# Patient Record
Sex: Female | Born: 1995 | Race: Black or African American | Hispanic: No | Smoking: Former smoker
Health system: Southern US, Community
[De-identification: ages and names within clinical notes are randomized; demographics above are authoritative.]

## PROBLEM LIST (undated history)

## (undated) DIAGNOSIS — K219 Gastro-esophageal reflux disease without esophagitis: Secondary | ICD-10-CM

## (undated) DIAGNOSIS — L309 Dermatitis, unspecified: Secondary | ICD-10-CM

## (undated) DIAGNOSIS — F419 Anxiety disorder, unspecified: Secondary | ICD-10-CM

## (undated) HISTORY — DX: Anxiety disorder, unspecified: F41.9

## (undated) HISTORY — PX: NO PAST SURGERIES: SHX2092

---
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
## 2013-09-02 ENCOUNTER — Emergency Department (HOSPITAL_COMMUNITY)
Admission: EM | Admit: 2013-09-02 | Discharge: 2013-09-02 | Disposition: A | Discharge status: Home / Self Care | Payer: PRIVATE HEALTH INSURANCE | Attending: Emergency Medicine | Admitting: Emergency Medicine

## 2013-09-02 ENCOUNTER — Encounter (HOSPITAL_COMMUNITY): Payer: Self-pay | Admitting: Emergency Medicine

## 2013-09-02 DIAGNOSIS — L259 Unspecified contact dermatitis, unspecified cause: Secondary | ICD-10-CM | POA: Insufficient documentation

## 2013-09-02 DIAGNOSIS — Z791 Long term (current) use of non-steroidal anti-inflammatories (NSAID): Secondary | ICD-10-CM | POA: Insufficient documentation

## 2013-09-02 DIAGNOSIS — L309 Dermatitis, unspecified: Secondary | ICD-10-CM

## 2013-09-02 HISTORY — DX: Dermatitis, unspecified: L30.9

## 2013-09-02 MED ORDER — CETIRIZINE HCL 10 MG PO TABS: 10.0000 mg | ORAL_TABLET | Freq: Every day | ORAL | Status: DC

## 2013-09-02 MED ORDER — TRIAMCINOLONE ACETONIDE 0.5 % EX OINT: 1.0000 "application " | TOPICAL_OINTMENT | Freq: Two times a day (BID) | CUTANEOUS | Status: DC

## 2013-09-02 MED ORDER — PREDNISONE 10 MG PO TABS: 20.0000 mg | ORAL_TABLET | Freq: Two times a day (BID) | ORAL | Status: DC

## 2013-09-02 NOTE — ED Notes (Signed)
Itching rash for 1 week

## 2013-09-02 NOTE — Discharge Instructions (Signed)
Use Eucerin Cream and take Benadryl at night if you continue to have itching. Return as needed.

## 2013-09-02 NOTE — ED Notes (Signed)
Patient with no complaints at this time. Respirations even and unlabored. Skin warm/dry. Discharge instructions reviewed with patient at this time. Patient given opportunity to voice concerns/ask questions. Patient discharged at this time and left Emergency Department with steady gait.   

## 2013-09-02 NOTE — ED Provider Notes (Signed)
CSN: [id number]     Arrival date & time 09/02/13  1940 History   First MD Initiated Contact with Patient 09/02/13 2000     Chief Complaint  Patient presents with  . Rash     (Consider location/radiation/quality/duration/timing/severity/associated sxs/prior Treatment) Patient is a 18 y.o. female presenting with rash. The history is provided by the patient.  Rash Location:  Shoulder/arm and torso Shoulder/arm rash location:  L arm and R arm Torso rash location:  Abd LLQ and abd RLQ Quality: dryness and itchiness   Severity:  Moderate Onset quality:  Gradual Duration:  1 week Timing:  Constant Progression:  Worsening Chronicity:  Recurrent Relieved by:  Nothing Worsened by:  Nothing tried Ineffective treatments:  Antihistamines and anti-itch cream  Anna Andrews is a 18 y.o. female who presents to the ED with a rash that started a week ago. She has a history of Eczema and this is the same. She complains of itching.  Past Medical History  Diagnosis Date  . Eczema    History reviewed. No pertinent past surgical history. History reviewed. No pertinent family history. History  Substance Use Topics  . Smoking status: Never Smoker   . Smokeless tobacco: Not on file  . Alcohol Use: No   OB History   Grav Para Term Preterm Abortions TAB SAB Ect Mult Living                 Review of Systems  Skin: Positive for rash.  all other systems negative    Allergies  Review of patient's allergies indicates no known allergies.  Home Medications   Prior to Admission medications   Medication Sig Start Date End Date Taking? Authorizing Provider  ibuprofen (ADVIL,MOTRIN) 200 MG tablet Take 400 mg by mouth 2 (two) times daily as needed for pain.    Historical Provider, MD  naproxen sodium (ALEVE) 220 MG tablet Take 220 mg by mouth daily as needed (cramps).    Historical Provider, MD   BP 129/81  Pulse 71  Temp(Src) 98 F (36.7 C) (Oral)  Resp 20  Ht 5' 7.5" (1.715 m)  Wt 156 lb  (70.761 kg)  BMI 24.06 kg/m2  SpO2 99%  LMP 08/19/2013 Physical Exam  Nursing note and vitals reviewed. Constitutional: She is oriented to person, place, and time. She appears well-developed and well-nourished.  HENT:  Head: Normocephalic and atraumatic.  Eyes: EOM are normal.  Neck: Neck supple.  Cardiovascular: Normal rate.   Pulmonary/Chest: Effort normal.  Musculoskeletal: Normal range of motion.  Neurological: She is alert and oriented to person, place, and time. No cranial nerve deficit.  Skin:  Dry, scaly, patches noted elbows, forearms and lower abdomen.   Psychiatric: She has a normal mood and affect. Her behavior is normal.    ED Course  Procedures (including critical care time) Labs Review  MDM  18 y.o. female with rash and itching x 1 week. Rash consistent with eczema. Will treat with [place] Cream, prednisone and she will take Benadryl as needed. She will use Eucerin cream to try and keep the skin moist. She will follow up with Dermatology if symptoms worsen. She will return here as needed.    Medication List    TAKE these medications       cetirizine 10 MG tablet  Commonly known as:  ZYRTEC ALLERGY  Take 1 tablet (10 mg total) by mouth daily.     predniSONE 10 MG tablet  Commonly known as:  DELTASONE  Take 2  tablets (20 mg total) by mouth 2 (two) times daily with a meal.     triamcinolone ointment 0.5 %  Commonly known as:  KENALOG  Apply 1 application topically 2 (two) times daily.      ASK your doctor about these medications       diphenhydrAMINE 25 MG tablet  Commonly known as:  BENADRYL  Take 25 mg by mouth once as needed.           [address]Hope MedinaM Neese, [place] 09/03/13 [phone]

## 2013-09-03 NOTE — ED Provider Notes (Signed)
Medical screening examination/treatment/procedure(s) were performed by non-physician practitioner and as supervising physician I was immediately available for consultation/collaboration.   EKG Interpretation None        Lyanne CoKevin M Campos, MD 09/03/13 0030

## 2014-10-09 ENCOUNTER — Emergency Department (HOSPITAL_COMMUNITY)
Admission: EM | Admit: 2014-10-09 | Discharge: 2014-10-09 | Disposition: A | Discharge status: Home / Self Care | Payer: PRIVATE HEALTH INSURANCE | Attending: Emergency Medicine | Admitting: Emergency Medicine

## 2014-10-09 ENCOUNTER — Encounter (HOSPITAL_COMMUNITY): Payer: Self-pay

## 2014-10-09 DIAGNOSIS — Z79899 Other long term (current) drug therapy: Secondary | ICD-10-CM | POA: Diagnosis not present

## 2014-10-09 DIAGNOSIS — Z7952 Long term (current) use of systemic steroids: Secondary | ICD-10-CM | POA: Diagnosis not present

## 2014-10-09 DIAGNOSIS — Y9389 Activity, other specified: Secondary | ICD-10-CM | POA: Diagnosis not present

## 2014-10-09 DIAGNOSIS — S46911A Strain of unspecified muscle, fascia and tendon at shoulder and upper arm level, right arm, initial encounter: Secondary | ICD-10-CM | POA: Diagnosis not present

## 2014-10-09 DIAGNOSIS — X58XXXA Exposure to other specified factors, initial encounter: Secondary | ICD-10-CM | POA: Insufficient documentation

## 2014-10-09 DIAGNOSIS — S199XXA Unspecified injury of neck, initial encounter: Secondary | ICD-10-CM | POA: Insufficient documentation

## 2014-10-09 DIAGNOSIS — Y9289 Other specified places as the place of occurrence of the external cause: Secondary | ICD-10-CM | POA: Insufficient documentation

## 2014-10-09 DIAGNOSIS — Z872 Personal history of diseases of the skin and subcutaneous tissue: Secondary | ICD-10-CM | POA: Diagnosis not present

## 2014-10-09 DIAGNOSIS — Y998 Other external cause status: Secondary | ICD-10-CM | POA: Insufficient documentation

## 2014-10-09 DIAGNOSIS — S4991XA Unspecified injury of right shoulder and upper arm, initial encounter: Secondary | ICD-10-CM | POA: Diagnosis present

## 2014-10-09 MED ORDER — KETOROLAC TROMETHAMINE 10 MG PO TABS
10.0000 mg | ORAL_TABLET | Freq: Once | ORAL | Status: AC
  Administered 2014-10-09: 10 mg via ORAL
  Filled 2014-10-09: qty 1

## 2014-10-09 MED ORDER — METHOCARBAMOL 500 MG PO TABS: 500.0000 mg | ORAL_TABLET | Freq: Three times a day (TID) | ORAL | Status: DC

## 2014-10-09 MED ORDER — METHOCARBAMOL 500 MG PO TABS
1000.0000 mg | ORAL_TABLET | Freq: Once | ORAL | Status: AC
  Administered 2014-10-09: 1000 mg via ORAL
  Filled 2014-10-09: qty 2

## 2014-10-09 MED ORDER — IBUPROFEN 600 MG PO TABS: 600.0000 mg | ORAL_TABLET | Freq: Four times a day (QID) | ORAL | Status: DC

## 2014-10-09 NOTE — ED Provider Notes (Signed)
CSN: [id number]     Arrival date & time 10/09/14  0759 History   First MD Initiated Contact with Patient 10/09/14 [phone]     Chief Complaint  Patient presents with  . Shoulder Pain     (Consider location/radiation/quality/duration/timing/severity/associated sxs/prior Treatment) HPI Comments: Patient is a 19 year old female who presents to the emergency department with right shoulder pain. The patient is right-handed, and she is a Tree surgeon. She states that over the last 3 or 4 days she has been doing more repetitive motion, in particular coping and blow drying than usual with right hand. She denies any recent injury. She denies any operations or procedures involving the right shoulder. She is not dropping objects, but hasn't pain with certain movements. She presents now for evaluation as she says that this Most of the night.  Patient is a 19 y.o. female presenting with shoulder pain. The history is provided by the patient.  Shoulder Pain Location:  Shoulder Associated symptoms: no back pain and no neck pain     Past Medical History  Diagnosis Date  . Eczema    No past surgical history on file. No family history on file. History  Substance Use Topics  . Smoking status: Never Smoker   . Smokeless tobacco: Not on file  . Alcohol Use: No   OB History    No data available     Review of Systems  Constitutional: Negative for activity change.       All ROS Neg except as noted in HPI  HENT: Negative for nosebleeds.   Eyes: Negative for photophobia and discharge.  Respiratory: Negative for cough, shortness of breath and wheezing.   Cardiovascular: Negative for chest pain and palpitations.  Gastrointestinal: Negative for abdominal pain and blood in stool.  Genitourinary: Negative for dysuria, frequency and hematuria.  Musculoskeletal: Positive for arthralgias and neck stiffness. Negative for back pain and neck pain.  Skin: Negative.   Neurological: Negative for dizziness, seizures and  speech difficulty.  Psychiatric/Behavioral: Negative for hallucinations and confusion.      Allergies  Review of patient's allergies indicates no known allergies.  Home Medications   Prior to Admission medications   Medication Sig Start Date End Date Taking? Authorizing Provider  cetirizine (ZYRTEC ALLERGY) 10 MG tablet Take 1 tablet (10 mg total) by mouth daily. 09/02/13   Hope Orlene Och, NP  diphenhydrAMINE (BENADRYL) 25 MG tablet Take 25 mg by mouth once as needed.    Historical Provider, MD  predniSONE (DELTASONE) 10 MG tablet Take 2 tablets (20 mg total) by mouth 2 (two) times daily with a meal. 09/02/13   Hope Orlene Och, NP  triamcinolone ointment (KENALOG) 0.5 % Apply 1 application topically 2 (two) times daily. 09/02/13   Hope Orlene Och, NP   There were no vitals taken for this visit. Physical Exam  Constitutional: She is oriented to person, place, and time. She appears well-developed and well-nourished.  Non-toxic appearance.  HENT:  Head: Normocephalic.  Right Ear: Tympanic membrane and external ear normal.  Left Ear: Tympanic membrane and external ear normal.  Eyes: EOM and lids are normal. Pupils are equal, round, and reactive to light.  Neck: Trachea normal. Neck supple. Muscular tenderness present. Carotid bruit is not present. Decreased range of motion present. No thyroid mass and no thyromegaly present.  Cardiovascular: Normal rate, regular rhythm, normal heart sounds, intact distal pulses and normal pulses.   Pulmonary/Chest: Breath sounds normal. No respiratory distress.  Abdominal: Soft. Bowel sounds are normal. There  is no tenderness. There is no guarding.  Musculoskeletal:       Right shoulder: She exhibits decreased range of motion, tenderness, pain and spasm. She exhibits no bony tenderness, no crepitus and no deformity.  Lymphadenopathy:       Head (right side): No submandibular adenopathy present.       Head (left side): No submandibular adenopathy present.    She  has no cervical adenopathy.  Neurological: She is alert and oriented to person, place, and time. She has normal strength. No cranial nerve deficit or sensory deficit.  Skin: Skin is warm and dry.  Psychiatric: She has a normal mood and affect. Her speech is normal.  Nursing note and vitals reviewed.   ED Course  Procedures (including critical care time) Labs Review Labs Reviewed - No data to display  Imaging Review No results found.   EKG Interpretation None      MDM  Vital signs are well within normal limits. The examination table muscle strain involving the shoulder extending into the neck.  The patient will be treated with an arm sling, Robaxin, and ibuprofen 600 mg 4 times a day. The patient is to see orthopedics for additional evaluation if not improving.    Final diagnoses:  None    *I have reviewed nursing notes, vital signs, and all appropriate lab and imaging results for this patient.[id number] PA-C 10/09/14 1610  Rolland Porter, MD 10/15/14 2139

## 2014-10-09 NOTE — Discharge Instructions (Signed)
Your examination is consistent with a muscle strain involving her shoulder and the muscle of your lower neck. Heating pad to the area may be helpful. Please use Robaxin 3 times daily (breakfast lunch bedtime). Use 600 mg of ibuprofen 4 times daily (breakfast, lunch, dinner, bedtime). Please see Dr. Hilda Lias for orthopedic evaluation if not improving. Shoulder Sprain A shoulder sprain is the result of damage to the tough, fiber-like tissues (ligaments) that help hold your shoulder in place. The ligaments may be stretched or torn. Besides the main shoulder joint (the ball and socket), there are several smaller joints that connect the bones in this area. A sprain usually involves one of those joints. Most often it is the acromioclavicular (or AC) joint. That is the joint that connects the collarbone (clavicle) and the shoulder blade (scapula) at the top point of the shoulder blade (acromion). A shoulder sprain is a mild form of what is called a shoulder separation. Recovering from a shoulder sprain may take some time. For some, pain lingers for several months. Most people recover without long term problems. CAUSES   A shoulder sprain is usually caused by some kind of trauma. This might be:  Falling on an outstretched arm.  Being hit hard on the shoulder.  Twisting the arm.  Shoulder sprains are more likely to occur in people who:  Play sports.  Have balance or coordination problems. SYMPTOMS   Pain when you move your shoulder.  Limited ability to move the shoulder.  Swelling and tenderness on top of the shoulder.  Redness or warmth in the shoulder.  Bruising.  A change in the shape of the shoulder. DIAGNOSIS  Your healthcare provider may:  Ask about your symptoms.  Ask about recent activity that might have caused those symptoms.  Examine your shoulder. You may be asked to do simple exercises to test movement. The other shoulder will be examined for comparison.  Order some tests  that provide a look inside the body. They can show the extent of the injury. The tests could include:  X-rays.  CT (computed tomography) scan.  MRI (magnetic resonance imaging) scan. RISKS AND COMPLICATIONS  Loss of full shoulder motion.  Ongoing shoulder pain. TREATMENT  How long it takes to recover from a shoulder sprain depends on how severe it was. Treatment options may include:  Rest. You should not use the arm or shoulder until it heals.  Ice. For 2 or 3 days after the injury, put an ice pack on the shoulder up to 4 times a day. It should stay on for 15 to 20 minutes each time. Wrap the ice in a towel so it does not touch your skin.  Over-the-counter medicine to relieve pain.  A sling or brace. This will keep the arm still while the shoulder is healing.  Physical therapy or rehabilitation exercises. These will help you regain strength and motion. Ask your healthcare provider when it is OK to begin these exercises.  Surgery. The need for surgery is rare with a sprained shoulder, but some people may need surgery to keep the joint in place and reduce pain. HOME CARE INSTRUCTIONS   Ask your healthcare provider about what you should and should not do while your shoulder heals.  Make sure you know how to apply ice to the correct area of your shoulder.  Talk with your healthcare provider about which medications should be used for pain and swelling.  If rehabilitation therapy will be needed, ask your healthcare provider to refer  you to a therapist. If it is not recommended, then ask about at-home exercises. Find out when exercise should begin. SEEK MEDICAL CARE IF:  Your pain, swelling, or redness at the joint increases. SEEK IMMEDIATE MEDICAL CARE IF:   You have a fever.  You cannot move your arm or shoulder. Document Released: 07/13/2008 Document Revised: 05/19/2011 Document Reviewed: 07/13/2008 [facility] Patient Information 2015 [place], [place]. This information is not  intended to replace advice given to you by your health care provider. Make sure you discuss any questions you have with your health care provider.

## 2014-10-09 NOTE — ED Notes (Signed)
Right shoulder pain x4 days, no known injury. Using icy hot cream with no relief.

## 2014-10-09 NOTE — ED Notes (Signed)
PA at bedside.

## 2016-08-20 ENCOUNTER — Ambulatory Visit (INDEPENDENT_AMBULATORY_CARE_PROVIDER_SITE_OTHER): Payer: PRIVATE HEALTH INSURANCE | Admitting: Adult Health

## 2016-08-20 ENCOUNTER — Encounter: Payer: Self-pay | Admitting: Adult Health

## 2016-08-20 ENCOUNTER — Other Ambulatory Visit (HOSPITAL_COMMUNITY)
Admission: RE | Admit: 2016-08-20 | Discharge: 2016-08-20 | Disposition: A | Discharge status: Home / Self Care | Payer: PRIVATE HEALTH INSURANCE | Source: Ambulatory Visit | Attending: Adult Health | Admitting: Adult Health

## 2016-08-20 VITALS — BP 124/78 | HR 88 | Ht 69.5 in | Wt 203.0 lb

## 2016-08-20 DIAGNOSIS — Z01419 Encounter for gynecological examination (general) (routine) without abnormal findings: Secondary | ICD-10-CM | POA: Insufficient documentation

## 2016-08-20 DIAGNOSIS — Z13220 Encounter for screening for lipoid disorders: Secondary | ICD-10-CM | POA: Diagnosis not present

## 2016-08-20 DIAGNOSIS — Z113 Encounter for screening for infections with a predominantly sexual mode of transmission: Secondary | ICD-10-CM | POA: Diagnosis not present

## 2016-08-20 NOTE — Progress Notes (Signed)
Patient ID: Anna Andrews, female   DOB: June 06, 1995, 21 y.o.   MRN: [id number] History of Present Illness: Anna Andrews is a 21 year old black female in for well woman gyn exam and her first pap. She is hair stylist.    Current Medications, Allergies, Past Medical History, Past Surgical History, Family History and Social History were reviewed in [facility] electronic medical record.     Review of Systems:  Patient denies any hearing loss, fatigue, blurred vision, shortness of breath, chest pain, abdominal pain, problems with bowel movements, urination, or intercourse(not currently active). No joint pain or mood swings.Has headache recently.Periods are good.    Physical Exam:BP 124/78 (BP Location: Right Arm, Patient Position: Sitting, Cuff Size: Normal)   Pulse 88   Ht 5' 9.5" (1.765 m)   Wt 203 lb (92.1 kg)   LMP 07/27/2016 (Approximate)   BMI 29.55 kg/m  General:  Well developed, well nourished, no acute distress Skin:  Warm and dry Neck:  Midline trachea, normal thyroid, good ROM, no lymphadenopathy, no tenderness over sinuses  Lungs; Clear to auscultation bilaterally Breast:  No dominant palpable mass, retraction, or nipple discharge Cardiovascular: Regular rate and rhythm Abdomen:  Soft, non tender, no hepatosplenomegaly,has navel ring Pelvic:  External genitalia is normal in appearance, no lesions.  The vagina is normal in appearance. Urethra has no lesions or masses. The cervix is smooth, pap with GC/CHL and reflex HPV performed.  Uterus is felt to be normal size, shape, and contour.  No adnexal masses or tenderness noted.Bladder is non tender, no masses felt. Extremities/musculoskeletal:  No swelling or varicosities noted, no clubbing or cyanosis Psych:  No mood changes, alert and cooperative,seems happy PHQ 2 score 0.She declines birth control at this time.   Impression: 1. Encounter for gynecological examination with Papanicolaou smear of cervix   2. Screening examination for  STD (sexually transmitted disease)   3. Screening cholesterol level       Plan: Check CBC,CMP,TSH and lipids,HIV and RPR Physical in 1 year,pap in 3 if normal Use condoms if has sex Take zyrtec, or Claritin, see eye doctor

## 2016-08-21 ENCOUNTER — Telehealth: Payer: Self-pay | Admitting: Adult Health

## 2016-08-21 LAB — COMPREHENSIVE METABOLIC PANEL
A/G RATIO: 1.5 (ref 1.2–2.2)
ALK PHOS: 49 IU/L (ref 39–117)
ALT: 12 IU/L (ref 0–32)
AST: 12 IU/L (ref 0–40)
Albumin: 4.3 g/dL (ref 3.5–5.5)
BILIRUBIN TOTAL: 0.3 mg/dL (ref 0.0–1.2)
BUN/Creatinine Ratio: 15 (ref 9–23)
BUN: 11 mg/dL (ref 6–20)
CHLORIDE: 104 mmol/L (ref 96–106)
CO2: 24 mmol/L (ref 20–29)
Calcium: 9.3 mg/dL (ref 8.7–10.2)
Creatinine, Ser: 0.74 mg/dL (ref 0.57–1.00)
GFR calc non Af Amer: 115 mL/min/{1.73_m2} (ref >59)
GFR, EST AFRICAN AMERICAN: 132 mL/min/{1.73_m2} (ref >59)
GLUCOSE: 88 mg/dL (ref 65–99)
Globulin, Total: 2.8 g/dL (ref 1.5–4.5)
POTASSIUM: 4.6 mmol/L (ref 3.5–5.2)
Sodium: 140 mmol/L (ref 134–144)
TOTAL PROTEIN: 7.1 g/dL (ref 6.0–8.5)

## 2016-08-21 LAB — CBC
Hematocrit: 38 % (ref 34.0–46.6)
Hemoglobin: 12.3 g/dL (ref 11.1–15.9)
MCH: 27.2 pg (ref 26.6–33.0)
MCHC: 32.4 g/dL (ref 31.5–35.7)
MCV: 84 fL (ref 79–97)
PLATELETS: 332 10*3/uL (ref 150–379)
RBC: 4.53 x10E6/uL (ref 3.77–5.28)
RDW: 13.8 % (ref 12.3–15.4)
WBC: 8.4 10*3/uL (ref 3.4–10.8)

## 2016-08-21 LAB — LIPID PANEL
CHOL/HDL RATIO: 3.3 ratio (ref 0.0–4.4)
CHOLESTEROL TOTAL: 161 mg/dL (ref 100–199)
HDL: 49 mg/dL (ref >39)
LDL Calculated: 98 mg/dL (ref 0–99)
TRIGLYCERIDES: 68 mg/dL (ref 0–149)
VLDL Cholesterol Cal: 14 mg/dL (ref 5–40)

## 2016-08-21 LAB — CYTOLOGY - PAP
CHLAMYDIA, DNA PROBE: NEGATIVE
Diagnosis: NEGATIVE
NEISSERIA GONORRHEA: NEGATIVE

## 2016-08-21 LAB — TSH: TSH: 1.59 u[IU]/mL (ref 0.450–4.500)

## 2016-08-21 LAB — HIV ANTIBODY (ROUTINE TESTING W REFLEX): HIV Screen 4th Generation wRfx: NONREACTIVE

## 2016-08-21 LAB — RPR: RPR Ser Ql: NONREACTIVE

## 2016-08-21 NOTE — Telephone Encounter (Signed)
Pt aware that labs are all good, and negative for HIV and syphilis

## 2016-12-15 ENCOUNTER — Telehealth: Payer: Self-pay | Admitting: Adult Health

## 2016-12-15 MED ORDER — NORETHIN-ETH ESTRAD-FE BIPHAS 1 MG-10 MCG / 10 MCG PO TABS: 1.0000 | ORAL_TABLET | Freq: Every day | ORAL | 4 refills | Status: DC

## 2016-12-15 NOTE — Telephone Encounter (Signed)
Pt requests birth control will try low dose OCs, rx lo leostrin, start today, use condoms

## 2016-12-15 NOTE — Telephone Encounter (Signed)
Pt called stating that she seen Victorino Dike not to long ago to discuss BC, Pt states that she is wanting one that they have discussed and would like Victorino Dike to call her back. Please contact pt

## 2017-06-10 ENCOUNTER — Telehealth: Payer: Self-pay | Admitting: *Deleted

## 2017-06-10 NOTE — Telephone Encounter (Signed)
Mailbox full

## 2017-06-11 ENCOUNTER — Other Ambulatory Visit: Payer: Self-pay

## 2017-06-11 ENCOUNTER — Encounter (HOSPITAL_COMMUNITY): Payer: Self-pay | Admitting: *Deleted

## 2017-06-11 ENCOUNTER — Emergency Department (HOSPITAL_COMMUNITY)
Admission: EM | Admit: 2017-06-11 | Discharge: 2017-06-11 | Disposition: A | Discharge status: Home / Self Care | Payer: PRIVATE HEALTH INSURANCE | Attending: Emergency Medicine | Admitting: Emergency Medicine

## 2017-06-11 DIAGNOSIS — M7918 Myalgia, other site: Secondary | ICD-10-CM

## 2017-06-11 DIAGNOSIS — M5489 Other dorsalgia: Secondary | ICD-10-CM | POA: Insufficient documentation

## 2017-06-11 DIAGNOSIS — F17210 Nicotine dependence, cigarettes, uncomplicated: Secondary | ICD-10-CM | POA: Diagnosis not present

## 2017-06-11 DIAGNOSIS — M791 Myalgia, unspecified site: Secondary | ICD-10-CM | POA: Insufficient documentation

## 2017-06-11 DIAGNOSIS — M549 Dorsalgia, unspecified: Secondary | ICD-10-CM

## 2017-06-11 DIAGNOSIS — Z79899 Other long term (current) drug therapy: Secondary | ICD-10-CM | POA: Diagnosis not present

## 2017-06-11 HISTORY — DX: Gastro-esophageal reflux disease without esophagitis: K21.9

## 2017-06-11 MED ORDER — PREDNISONE 10 MG PO TABS: 40.0000 mg | ORAL_TABLET | Freq: Every day | ORAL | 0 refills | Status: AC

## 2017-06-11 MED ORDER — ACETAMINOPHEN 500 MG PO TABS
1000.0000 mg | ORAL_TABLET | Freq: Once | ORAL | Status: AC
  Administered 2017-06-11: 1000 mg via ORAL
  Filled 2017-06-11: qty 2

## 2017-06-11 MED ORDER — CYCLOBENZAPRINE HCL 5 MG PO TABS: 5.0000 mg | ORAL_TABLET | Freq: Every evening | ORAL | 0 refills | Status: DC | PRN

## 2017-06-11 NOTE — ED Provider Notes (Signed)
[facility] EMERGENCY DEPARTMENT Provider Note   CSN: [id number] Arrival date & time: 06/11/17  8295     History   Chief Complaint Chief Complaint  Patient presents with  . Back Pain    HPI Anna Andrews is a 21 y.o. female presenting for evaluation of back pain.  She states for the past week, she has had back pain.  Is on the right side, radiates down her right arm and right leg.  This began after a deep tissue massage on Tuesday.  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  She has been taking ibuprofen with mild improvement of her pain, although recently was diagnosed with GERD and can no longer take ibuprofen.  She denies fall, trauma, or injury.  She denies fevers, chills, persistent numbness, loss of bowel or bladder control, history of cancer, history of IV drug use.  No other medical problems, does not take medications daily.  Pain is described as a soreness or an ache that begins in her back and radiates up and down.  Resting makes it better, movement makes it worse.  She reports intermittent abnormal sensation of her right hand with specific movements, mostly arching her back.  This improves when she changes positions.  She is not dropping things with her hand.  HPI  Past Medical History:  Diagnosis Date  . Acid reflux   . Eczema     Patient Active Problem List   Diagnosis Date Noted  . Encounter for gynecological examination with Papanicolaou smear of cervix 08/20/2016    History reviewed. No pertinent surgical history.   OB History    Gravida  0   Para  0   Term  0   Preterm  0   AB  0   Living  0     SAB  0   TAB  0   Ectopic  0   Multiple  0   Live Births               Home Medications    Prior to Admission medications   Medication Sig Start Date End Date Taking? Authorizing Provider  cetirizine (ZYRTEC ALLERGY) 10 MG tablet Take 1 tablet (10 mg total) by mouth daily. Patient not taking:  Reported on 08/20/2016 09/02/13   Janne Napoleon, NP  cyclobenzaprine (FLEXERIL) 5 MG tablet Take 1 tablet (5 mg total) by mouth at bedtime as needed for muscle spasms. 06/11/17   Delinda Malan, PA-C  diphenhydrAMINE (BENADRYL) 25 MG tablet Take 25 mg by mouth once as needed.    [provider]  ibuprofen (ADVIL,MOTRIN) 600 MG tablet Take 1 tablet (600 mg total) by mouth 4 (four) times daily. Take with each meal and bed time. 10/09/14   Ivery Quale, PA-C  Norethindrone-Ethinyl Estradiol-Fe Biphas (LO LOESTRIN FE) 1 MG-10 MCG / 10 MCG tablet Take 1 tablet by mouth daily. Take 1 daily by mouth 12/15/16   Adline Potter, NP  predniSONE (DELTASONE) 10 MG tablet Take 4 tablets (40 mg total) by mouth daily for 4 days. 06/11/17 06/15/17  Joeli Fenner, PA-C  triamcinolone ointment (KENALOG) 0.5 % Apply 1 application topically 2 (two) times daily. Patient not taking: Reported on 08/20/2016 09/02/13   Janne Napoleon, NP    Family History Family History  Problem Relation Age of Onset  . Hypertension Paternal Grandfather   . Hypertension Mother     Social History Social  History   Tobacco Use  . Smoking status: Light Tobacco Smoker    Types: Cigarettes  . Smokeless tobacco: Never Used  . Tobacco comment: occ  Substance Use Topics  . Alcohol use: No  . Drug use: No     Allergies   Patient has no known allergies.   Review of Systems Review of Systems  Constitutional: Negative for chills and fever.  Respiratory: Negative for cough.   Cardiovascular: Negative for chest pain.  Gastrointestinal: Negative for abdominal pain, nausea and vomiting.  Genitourinary: Negative for dysuria, frequency and hematuria.  Musculoskeletal: Positive for back pain and myalgias. Negative for neck pain.  Skin: Negative for wound.  Allergic/Immunologic: Negative for immunocompromised state.  Neurological: Negative for numbness.  Hematological: Does not bruise/bleed easily.     Physical  Exam Updated Vital Signs BP (!) 122/91   Pulse 97   Temp 98.5 F (36.9 C) (Oral)   Resp 16   Ht 5\' 8"  (1.727 m)   Wt 88.5 kg (195 lb)   LMP  (Within Weeks)   SpO2 100%   BMI 29.65 kg/m   Physical Exam  Constitutional: She is oriented to person, place, and time. She appears well-developed and well-nourished. No distress.  HENT:  Head: Normocephalic and atraumatic.  Eyes: Pupils are equal, round, and reactive to light. Conjunctivae and EOM are normal.  Neck: Normal range of motion. Neck supple.  No tenderness to palpation of C-spine  Cardiovascular: Normal rate, regular rhythm and intact distal pulses.  Pulmonary/Chest: Effort normal and breath sounds normal. No respiratory distress. She has no wheezes.  Abdominal: Soft. Bowel sounds are normal. She exhibits no distension. There is no tenderness.  Musculoskeletal: Normal range of motion.  Tenderness to palpation of right-sided back musculature, mostly along the erector spinae.  No tenderness to palpation over midline back or left side musculature.  Pain extends into the right trapezius.  Grip strength equal bilaterally.  Radial pulses intact bilaterally.  Sensation intact bilaterally.  No increased pain with straight leg raise or radiation of the pain.  Pedal pulses equal bilaterally.  Sensation intact bilaterally.  Patient is ambulatory.  Neurological: She is alert and oriented to person, place, and time. She displays normal reflexes. No sensory deficit.  Skin: Skin is warm and dry.  Psychiatric: She has a normal mood and affect.  Nursing note and vitals reviewed.    ED Treatments / Results  Labs (all labs ordered are listed, but only abnormal results are displayed) Labs Reviewed - No data to display  EKG None  Radiology No results found.  Procedures Procedures (including critical care time)  Medications Ordered in ED Medications  acetaminophen (TYLENOL) tablet 1,000 mg (1,000 mg Oral Given 06/11/17 1110)     Initial  Impression / Assessment and Plan / ED Course  I have reviewed the triage vital signs and the nursing notes.  Pertinent labs & imaging results that were available during my care of the patient were reviewed by me and considered in my medical decision making (see chart for details).     Patient presenting for evaluation of back pain.  Physical exam reassuring, she is neurovascularly intact.  No red flags for back pain.  Pain is reproducible with palpation of the musculature.  At this time, doubt fracture, infection, spinal cord compression, myelopathy, or cauda equina syndrome.  Patient concerned about a blood clot, doubt blood clot due to description of symptoms and physical exam.  Will treat symptomatically with muscle relaxers, Tylenol, and  steroids.  Follow-up with orthopedics and/or primary care as needed.  At this time, patient appears safe for discharge.  Return precautions given.  Patient states she understands and agrees to plan.   Final Clinical Impressions(s) / ED Diagnoses   Final diagnoses:  Other acute back pain  Musculoskeletal pain    ED Discharge Orders        Ordered    predniSONE (DELTASONE) 10 MG tablet  Daily     06/11/17 1054    cyclobenzaprine (FLEXERIL) 5 MG tablet  At bedtime PRN     06/11/17 1054       Clayson Riling, PA-C 06/11/17 1110    Vanetta MuldersZackowski, Scott, MD 06/12/17 [phone]

## 2017-06-11 NOTE — ED Triage Notes (Signed)
Pt c/o right sided back pain with tingling and numbness into the right arm x couple of days. Denies injury.

## 2017-06-11 NOTE — Discharge Instructions (Addendum)
Take prednisone as prescribed.  Use the muscle relaxer as needed for soreness.  Have caution as this may make you tired or groggy.  Do not drive or operate heavy machinery while taking this medicine. You may supplement with Tylenol as needed for pain control.  You may take up to 1000 mg 3 times a day. Follow-up with either your primary care doctor or the orthopedic doctor in 1 week for further evaluation if your symptoms are not improving. Try to rest over the next several days.  Do not remain in one position for an extended period of time. Use heating packs to help relax her muscles. Return to the emergency room if you develop fevers, loss of bowel or bladder control, persistent numbness, or any new or concerning symptoms.

## 2017-06-12 ENCOUNTER — Telehealth: Payer: Self-pay | Admitting: Adult Health

## 2017-06-12 NOTE — Telephone Encounter (Signed)
Had cramping with periods and stopped OCs, has pain in calf  And shoulder was seen in ER, to make appt to be seen

## 2017-08-26 ENCOUNTER — Other Ambulatory Visit: Payer: PRIVATE HEALTH INSURANCE | Admitting: Adult Health

## 2017-09-30 ENCOUNTER — Other Ambulatory Visit: Payer: PRIVATE HEALTH INSURANCE | Admitting: Adult Health

## 2017-11-18 ENCOUNTER — Other Ambulatory Visit: Payer: PRIVATE HEALTH INSURANCE | Admitting: Adult Health

## 2017-12-18 ENCOUNTER — Telehealth: Payer: Self-pay | Admitting: *Deleted

## 2017-12-18 ENCOUNTER — Ambulatory Visit: Payer: PRIVATE HEALTH INSURANCE | Admitting: Obstetrics & Gynecology

## 2017-12-18 ENCOUNTER — Other Ambulatory Visit: Payer: Self-pay

## 2017-12-18 ENCOUNTER — Encounter: Payer: Self-pay | Admitting: Obstetrics & Gynecology

## 2017-12-18 VITALS — BP 139/94 | HR 103 | Ht 68.0 in | Wt 186.0 lb

## 2017-12-18 DIAGNOSIS — F329 Major depressive disorder, single episode, unspecified: Secondary | ICD-10-CM | POA: Diagnosis not present

## 2017-12-18 MED ORDER — ESCITALOPRAM OXALATE 10 MG PO TABS: 10.0000 mg | ORAL_TABLET | Freq: Every day | ORAL | 1 refills | Status: DC

## 2017-12-18 NOTE — Telephone Encounter (Signed)
Spoke with pt. She feels like she has a lot on her. Feels anxiety and anger. Not suicidal. Has had these feelings for a few days.  Dr. Despina Hidden had an opening at 20. Pt advised to come in then. JSY

## 2017-12-18 NOTE — Progress Notes (Signed)
Patient ID: Anna Andrews, female   DOB: 1995/06/13, 22 y.o.   MRN: [id number]  Consult for Depression  Reason for Consult:  Acute depressive episode Referring Physician:   [name] is an 22 y.o. female  Assessment: Reactive depression  Past Medical History:  Diagnosis Date  . Acid reflux   . Eczema     Plan:  No evidence of imminent risk to self or others at present.   Supportive therapy provided about ongoing stressors. begin lexapro 10 mg daily  Subjective: Anna Andrews is a 22 y.o. female patient consulted for Depression. consulted for Depression.  HPI:  Pt began to have depression symptoms 1 1/2 weeks ago after taking her Mom to her therapist appointment and her mother said flippantly "if this didn't work she was going to kill herself" That caused an immediate reaction in the patient that has worsened since that day Never felt this way before never been treated for depression before or anxiety Past Psychiatric History: None  None Past Medical History:  Diagnosis Date  . Acid reflux   . Eczema    History reviewed. No pertinent surgical history.  reports that she has been smoking cigarettes. She has been smoking about 0.50 packs per day. She has never used smokeless tobacco. She reports that she does not drink alcohol or use drugs. Family History  Problem Relation Age of Onset  . Hypertension Paternal Grandfather   . Hypertension Mother    Allergies:  No Known Allergies  Objective: Blood pressure (!) 139/94, pulse (!) 103, height 5\' 8"  (1.727 m), weight 186 lb (84.4 kg), last menstrual period 11/27/2017.Body mass index is 28.28 kg/m. No results found for this or any previous visit (from the past 72 hour(s)). Labs are reviewed and are pertinent for .  Current Outpatient Medications  Medication Sig Dispense Refill  . diphenhydrAMINE (BENADRYL) 25 MG tablet Take 25 mg by mouth once as needed.    Marland Kitchen ibuprofen (ADVIL,MOTRIN) 600 MG tablet Take 1 tablet (600 mg  total) by mouth 4 (four) times daily. Take with each meal and bed time. (Patient not taking: Reported on 12/18/2017) 30 tablet 0   No current facility-administered medications for this visit.     Psychiatric Specialty Exam: Physical Exam  ROS  Blood pressure (!) 139/94, pulse (!) 103, height 5\' 8"  (1.727 m), weight 186 lb (84.4 kg), last menstrual period 11/27/2017.Body mass index is 28.28 kg/m.  General Appearance: Meticulous, Neat and Well Groomed  Eye Contact::  Good  Speech:  Clear and Coherent and Normal Rate  Volume:  Normal  Mood:  Anxious  Affect:  Appropriate and Congruent  Thought Process:  Coherent and Linear  Orientation:  Full (Time, Place, and Person)  Thought Content:  Logical and Abstract Reasoning  Suicidal Thoughts:  No  Homicidal Thoughts:  No  Memory:  NA  Judgement:  Good  Insight:  Good  Psychomotor Activity:  Negative  Concentration:  Fair  Recall:  Good  Akathisia:  NA  Handed:  Right  AIMS (if indicated):     Assets:  Communication Skills Desire for Improvement  Sleep: disturbed      Meds ordered this encounter  Medications  . escitalopram (LEXAPRO) 10 MG tablet    Sig: Take 1 tablet (10 mg total) by mouth daily.    Dispense:  30 tablet    Refill:  1    Anna Andrews 12/18/2017 12:36 PM

## 2017-12-24 ENCOUNTER — Ambulatory Visit: Payer: PRIVATE HEALTH INSURANCE

## 2017-12-24 DIAGNOSIS — Z0130 Encounter for examination of blood pressure without abnormal findings: Secondary | ICD-10-CM

## 2017-12-24 NOTE — Progress Notes (Signed)
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 Advise pt go ER. She has headache and rt hand tingling.Vernia Buff CMA

## 2018-01-20 ENCOUNTER — Ambulatory Visit: Payer: PRIVATE HEALTH INSURANCE | Admitting: Obstetrics & Gynecology

## 2018-01-20 ENCOUNTER — Encounter: Payer: Self-pay | Admitting: Obstetrics & Gynecology

## 2018-01-20 VITALS — BP 136/90 | HR 117 | Ht 68.0 in | Wt 186.0 lb

## 2018-01-20 DIAGNOSIS — F329 Major depressive disorder, single episode, unspecified: Secondary | ICD-10-CM | POA: Diagnosis not present

## 2018-01-20 NOTE — Progress Notes (Signed)
Subjective:     Anna Andrews is a 22 y.o. female who presents for follow up of depression. Current symptoms include uneasy feeling. Symptoms have been gradually improving since that time. Patient denies insomnia, suicidal thoughts with specific plan and suicidal thoughts without plan. Previous treatment includes: none. She complains of the following side effects from the treatment: nausea.  The following portions of the patient's history were reviewed and updated as appropriate: allergies, current medications, past family history, past medical history, past social history, past surgical history and problem list.  Review of Systems Pertinent items are noted in HPI.    Objective:    BP 136/90 (BP Location: Left Arm, Patient Position: Sitting, Cuff Size: Normal)   Pulse (!) 117   Ht 5\' 8"  (1.727 m)   Wt 186 lb (84.4 kg)   LMP 12/27/2017   BMI 28.28 kg/m   General:  alert  Affect & Behavior:  full facial expressions, good grooming, good insight, normal perception, normal reasoning, normal speech pattern and content and normal thought patterns       Assessment:    Depression, gradually improving      Plan:    Medications: Lexapro. Instructed patient to contact office or on-call physician promptly should condition worsen or any new symptoms appear and provided on-call telephone numbers. IF THE PATIENT HAS ANY SUICIDAL OR HOMICIDAL IDEATIONS, CALL THE OFFICE, DISCUSS WITH A SUPPORT MEMBER, OR GO TO THE ER IMMEDIATELY. Patient was agreeable with this plan. Follow up: 3 months.       Face to face time:  15 minutes  Greater than 50% of the visit time was spent in counseling and coordination of care with the patient.  The summary and outline of the counseling and care coordination is summarized in the note above.   All questions were answered.

## 2018-04-20 ENCOUNTER — Ambulatory Visit: Payer: PRIVATE HEALTH INSURANCE | Admitting: Obstetrics & Gynecology

## 2018-08-09 ENCOUNTER — Other Ambulatory Visit: Payer: Self-pay | Admitting: Obstetrics & Gynecology

## 2018-08-12 NOTE — Telephone Encounter (Signed)
Pt checking status on med refill 

## 2018-09-06 ENCOUNTER — Telehealth: Payer: Self-pay | Admitting: Adult Health

## 2018-09-06 NOTE — Telephone Encounter (Signed)
Patient called, had a positive home pregnancy test.  Scheduled for 7/06 with Anderson Malta for a tele visit.  She wants to know if she should discontinue there Lexapro and wanted to know about taking prenatal vitamins.    [phone]

## 2018-09-06 NOTE — Telephone Encounter (Signed)
Patient stated she is taking Lexapro and wants to know if it is ok for her to continue taking while pregnant.  Informed patient it was fine to take during pregnancy.  Patient states she doesn't think she needs it so is going to try to stop.  Informed if she stops taking it and notices a difference, she may need to get back on it.  Verbalized understanding.

## 2018-09-09 ENCOUNTER — Telehealth: Payer: Self-pay | Admitting: Adult Health

## 2018-09-09 NOTE — Telephone Encounter (Signed)

## 2018-09-13 ENCOUNTER — Other Ambulatory Visit: Payer: Self-pay

## 2018-09-13 ENCOUNTER — Ambulatory Visit (INDEPENDENT_AMBULATORY_CARE_PROVIDER_SITE_OTHER): Payer: PRIVATE HEALTH INSURANCE | Admitting: Adult Health

## 2018-09-13 ENCOUNTER — Encounter: Payer: Self-pay | Admitting: Adult Health

## 2018-09-13 DIAGNOSIS — Z3A01 Less than 8 weeks gestation of pregnancy: Secondary | ICD-10-CM | POA: Insufficient documentation

## 2018-09-13 DIAGNOSIS — O3680X0 Pregnancy with inconclusive fetal viability, not applicable or unspecified: Secondary | ICD-10-CM | POA: Diagnosis not present

## 2018-09-13 DIAGNOSIS — Z3201 Encounter for pregnancy test, result positive: Secondary | ICD-10-CM

## 2018-09-13 MED ORDER — PRENATAL PLUS 27-1 MG PO TABS: 1.0000 | ORAL_TABLET | Freq: Every day | ORAL | 12 refills | Status: DC

## 2018-09-13 NOTE — Progress Notes (Signed)
Patient ID: Anna Andrews, female   DOB: 1995/11/09, 23 y.o.   MRN: [id number]   TELEHEALTH VIRTUAL GYNECOLOGY VISIT ENCOUNTER NOTE  I connected with Anna Andrews on 09/13/18 at 12:00 PM EDT by telephone at home and verified that I am speaking with the correct person using two identifiers.   I discussed the limitations, risks, security and privacy concerns of performing an evaluation and management service by telephone and the availability of in person appointments. I also discussed with the patient that there may be a patient responsible charge related to this service. The patient expressed understanding and agreed to proceed.   History:  Anna Andrews is a 23 y.o. G1P0000 female being evaluated today for missed period and 3+HPTs.LMP 08/02/18, so about 6 weeks with EDD 05/09/19.She is moody at times, and she is weaning off lexapro, is on 5 mg now, she was placed on it for anxiety to to COVID,she is aware she could stay on it but wants to stop it. . She denies any abnormal vaginal discharge, bleeding, pelvic pain or other concerns.    PCP is Dr Nevada Crane.   Past Medical History:  Diagnosis Date  . Acid reflux   . Eczema    History reviewed. No pertinent surgical history. The following portions of the patient's history were reviewed and updated as appropriate: allergies, current medications, past family history, past medical history, past social history, past surgical history and problem list.   Health Maintenance:  Normal pap on 08/20/16.  Review of Systems:  Pertinent items noted in HPI and remainder of comprehensive ROS otherwise negative.  Physical Exam:   General:  Alert, oriented and cooperative.   Mental Status: Normal mood and affect perceived. Normal judgment and thought content.  Physical exam deferred due to nature of the encounter  Labs and Imaging No results found for this or any previous visit (from the past 336 hour(s)). No results found.    Assessment and Plan:     1.  Positive pregnancy test 3+HPTs,   2. Less than [redacted] weeks gestation of pregnancy Will Rx PNV OK to wean off lexapro.  Meds ordered this encounter  Medications  . prenatal vitamin w/FE, FA (PRENATAL 1 + 1) 27-1 MG TABS tablet    Sig: Take 1 tablet by mouth daily at 12 noon.    Dispense:  30 tablet    Refill:  12    Order Specific Question:   Supervising Provider    Answer:   Elonda Husky, LUTHER H [[id number]]    3. Encounter to determine fetal viability of pregnancy, single or unspecified fetus Dating [place] in about  2 weeks       I discussed the assessment and treatment plan with the patient. The patient was provided an opportunity to ask questions and all were answered. The patient agreed with the plan and demonstrated an understanding of the instructions.   The patient was advised to call back or seek an in-person evaluation/go to the ED if the symptoms worsen or if the condition fails to improve as anticipated.  I provided 6 minutes of non-face-to-face time during this encounter.   Derrek Monaco, NP [facility], [place]

## 2018-10-01 ENCOUNTER — Telehealth: Payer: Self-pay | Admitting: Adult Health

## 2018-10-01 NOTE — Telephone Encounter (Signed)

## 2018-10-04 ENCOUNTER — Other Ambulatory Visit: Payer: Self-pay

## 2018-10-04 ENCOUNTER — Ambulatory Visit (INDEPENDENT_AMBULATORY_CARE_PROVIDER_SITE_OTHER): Payer: PRIVATE HEALTH INSURANCE

## 2018-10-04 DIAGNOSIS — O3680X0 Pregnancy with inconclusive fetal viability, not applicable or unspecified: Secondary | ICD-10-CM

## 2018-10-04 DIAGNOSIS — Z3A09 9 weeks gestation of pregnancy: Secondary | ICD-10-CM

## 2018-10-04 NOTE — Progress Notes (Signed)
[place] 9 wks,single IUP w/ys,positive fht 167 bpm,normal right ovary,left ovary not visualized,crl 23.7 mm

## 2018-10-18 ENCOUNTER — Telehealth: Payer: Self-pay | Admitting: Women's Health

## 2018-10-18 NOTE — Telephone Encounter (Signed)

## 2018-10-19 ENCOUNTER — Other Ambulatory Visit: Payer: Self-pay

## 2018-10-19 ENCOUNTER — Encounter: Payer: Self-pay | Admitting: Women's Health

## 2018-10-19 ENCOUNTER — Ambulatory Visit (INDEPENDENT_AMBULATORY_CARE_PROVIDER_SITE_OTHER): Payer: PRIVATE HEALTH INSURANCE | Admitting: Women's Health

## 2018-10-19 VITALS — BP 121/83 | HR 87 | Wt 212.0 lb

## 2018-10-19 DIAGNOSIS — Z1389 Encounter for screening for other disorder: Secondary | ICD-10-CM

## 2018-10-19 DIAGNOSIS — Z331 Pregnant state, incidental: Secondary | ICD-10-CM

## 2018-10-19 DIAGNOSIS — Z3400 Encounter for supervision of normal first pregnancy, unspecified trimester: Secondary | ICD-10-CM | POA: Insufficient documentation

## 2018-10-19 DIAGNOSIS — Z3A11 11 weeks gestation of pregnancy: Secondary | ICD-10-CM

## 2018-10-19 DIAGNOSIS — F419 Anxiety disorder, unspecified: Secondary | ICD-10-CM | POA: Insufficient documentation

## 2018-10-19 DIAGNOSIS — Z3401 Encounter for supervision of normal first pregnancy, first trimester: Secondary | ICD-10-CM

## 2018-10-19 HISTORY — DX: Encounter for supervision of normal first pregnancy, unspecified trimester: Z34.00

## 2018-10-19 LAB — POCT URINALYSIS DIPSTICK OB
Blood, UA: NEGATIVE
Glucose, UA: NEGATIVE
Ketones, UA: NEGATIVE
Leukocytes, UA: NEGATIVE
Nitrite, UA: NEGATIVE
POC,PROTEIN,UA: NEGATIVE

## 2018-10-19 MED ORDER — BLOOD PRESSURE MONITOR MISC: 0 refills | Status: DC

## 2018-10-19 NOTE — Patient Instructions (Signed)
Anna Andrews, I greatly value your feedback.  If you receive a survey following your visit with [place] today, we appreciate you taking the time to fill it out.  Thanks, Anna Andrews, CNM, [facility]  [place]!!! It is now [facility] at [facility] ([place], [place] [address]) Entrance located off of [address] parking   Nausea & Vomiting  Have saltine crackers or pretzels by your bed and eat a few bites before you raise your head out of bed in the morning  Eat small frequent meals throughout the day instead of large meals  Drink plenty of fluids throughout the day to stay hydrated, just don't drink a lot of fluids with your meals.  This can make your stomach fill up faster making you feel sick  Do not brush your teeth right after you eat  Products with real ginger are good for nausea, like ginger ale and ginger hard candy Make sure it says made with real ginger!  Sucking on sour candy like lemon heads is also good for nausea  If your prenatal vitamins make you nauseated, take them at night so you will sleep through the nausea  Sea Bands  If you feel like you need medicine for the nausea & vomiting please let us know  If you are unable to keep any fluids or food down please let us know   Constipation  Drink plenty of fluid, preferably water, throughout the day  Eat foods high in fiber such as fruits, vegetables, and grains  Exercise, such as walking, is a good way to keep your bowels regular  Drink warm fluids, especially warm prune juice, or decaf coffee  Eat a 1/2 cup of real oatmeal (not instant), 1/2 cup applesauce, and 1/2-1 cup warm prune juice every day  If needed, you may take Colace (docusate sodium) stool softener once or twice a day to help keep the stool soft.   If you still are having problems with constipation, you may take Miralax once daily as needed to help keep your bowels regular.   Home Blood  Pressure Monitoring for Patients   Your provider has recommended that you check your blood pressure (BP) at least once a week at home. If you do not have a blood pressure cuff at home, one will be provided for you. Contact your provider if you have not received your monitor within 1 week.   Helpful Tips for Accurate Home Blood Pressure Checks  . Don't smoke, exercise, or drink caffeine 30 minutes before checking your BP . Use the restroom before checking your BP (a full bladder can raise your pressure) . Relax in a comfortable upright chair . Feet on the ground . Left arm resting comfortably on a flat surface at the level of your heart . Legs uncrossed . Back supported . Sit quietly and don't talk . Place the cuff on your bare arm . Adjust snuggly, so that only two fingertips can fit between your skin and the top of the cuff . Check 2 readings separated by at least one minute . Keep a log of your BP readings . For a visual, please reference this diagram: http://ccnc.care/bpdiagram  Provider Name: [facility]     Phone: [phone]  Zone 1: ALL CLEAR  Continue to monitor your symptoms:  . BP reading is less than 140 (top number) or less than 90 (bottom number)  . No right upper stomach  pain . No headaches or seeing spots . No feeling nauseated or throwing up . No swelling in face and hands  Zone 2: CAUTION Call your doctor's office for any of the following:  . BP reading is greater than 140 (top number) or greater than 90 (bottom number)  . Stomach pain under your ribs in the middle or right side . Headaches or seeing spots . Feeling nauseated or throwing up . Swelling in face and hands  Zone 3: EMERGENCY  Seek immediate medical care if you have any of the following:  . BP reading is greater than160 (top number) or greater than 110 (bottom number) . Severe headaches not improving with Tylenol . Serious difficulty catching your breath . Any worsening symptoms from Zone  2    First Trimester of Pregnancy The first trimester of pregnancy is from week 1 until the end of week 12 (months 1 through 3). A week after a sperm fertilizes an egg, the egg will implant on the wall of the uterus. This embryo will begin to develop into a baby. Genes from you and your partner are forming the baby. The female genes determine whether the baby is a boy or a girl. At 6-8 weeks, the eyes and face are formed, and the heartbeat can be seen on ultrasound. At the end of 12 weeks, all the baby's organs are formed.  Now that you are pregnant, you will want to do everything you can to have a healthy baby. Two of the most important things are to get good prenatal care and to follow your health care provider's instructions. Prenatal care is all the medical care you receive before the baby's birth. This care will help prevent, find, and treat any problems during the pregnancy and childbirth. BODY CHANGES Your body goes through many changes during pregnancy. The changes vary from woman to woman.   You may gain or lose a couple of pounds at first.  You may feel sick to your stomach (nauseous) and throw up (vomit). If the vomiting is uncontrollable, call your health care provider.  You may tire easily.  You may develop headaches that can be relieved by medicines approved by your health care provider.  You may urinate more often. Painful urination may mean you have a bladder infection.  You may develop heartburn as a result of your pregnancy.  You may develop constipation because certain hormones are causing the muscles that push waste through your intestines to slow down.  You may develop hemorrhoids or swollen, bulging veins (varicose veins).  Your breasts may begin to grow larger and become tender. Your nipples may stick out more, and the tissue that surrounds them (areola) may become darker.  Your gums may bleed and may be sensitive to brushing and flossing.  Dark spots or blotches  (chloasma, mask of pregnancy) may develop on your face. This will likely fade after the baby is born.  Your menstrual periods will stop.  You may have a loss of appetite.  You may develop cravings for certain kinds of food.  You may have changes in your emotions from day to day, such as being excited to be pregnant or being concerned that something may go wrong with the pregnancy and baby.  You may have more vivid and strange dreams.  You may have changes in your hair. These can include thickening of your hair, rapid growth, and changes in texture. Some women also have hair loss during or after pregnancy, or hair that  feels dry or thin. Your hair will most likely return to normal after your baby is born. WHAT TO EXPECT AT YOUR PRENATAL VISITS During a routine prenatal visit:  You will be weighed to make sure you and the baby are growing normally.  Your blood pressure will be taken.  Your abdomen will be measured to track your baby's growth.  The fetal heartbeat will be listened to starting around week 10 or 12 of your pregnancy.  Test results from any previous visits will be discussed. Your health care provider may ask you:  How you are feeling.  If you are feeling the baby move.  If you have had any abnormal symptoms, such as leaking fluid, bleeding, severe headaches, or abdominal cramping.  If you have any questions. Other tests that may be performed during your first trimester include:  Blood tests to find your blood type and to check for the presence of any previous infections. They will also be used to check for low iron levels (anemia) and Rh antibodies. Later in the pregnancy, blood tests for diabetes will be done along with other tests if problems develop.  Urine tests to check for infections, diabetes, or protein in the urine.  An ultrasound to confirm the proper growth and development of the baby.  An amniocentesis to check for possible genetic problems.  Fetal  screens for spina bifida and Down syndrome.  You may need other tests to make sure you and the baby are doing well. HOME CARE INSTRUCTIONS  Medicines  Follow your health care provider's instructions regarding medicine use. Specific medicines may be either safe or unsafe to take during pregnancy.  Take your prenatal vitamins as directed.  If you develop constipation, try taking a stool softener if your health care provider approves. Diet  Eat regular, well-balanced meals. Choose a variety of foods, such as meat or vegetable-based protein, fish, milk and low-fat dairy products, vegetables, fruits, and whole grain breads and cereals. Your health care provider will help you determine the amount of weight gain that is right for you.  Avoid raw meat and uncooked cheese. These carry germs that can cause birth defects in the baby.  Eating four or five small meals rather than three large meals a day may help relieve nausea and vomiting. If you start to feel nauseous, eating a few soda crackers can be helpful. Drinking liquids between meals instead of during meals also seems to help nausea and vomiting.  If you develop constipation, eat more high-fiber foods, such as fresh vegetables or fruit and whole grains. Drink enough fluids to keep your urine clear or pale yellow. Activity and Exercise  Exercise only as directed by your health care provider. Exercising will help you:  Control your weight.  Stay in shape.  Be prepared for labor and delivery.  Experiencing pain or cramping in the lower abdomen or low back is a good sign that you should stop exercising. Check with your health care provider before continuing normal exercises.  Try to avoid standing for long periods of time. Move your legs often if you must stand in one place for a long time.  Avoid heavy lifting.  Wear low-heeled shoes, and practice good posture.  You may continue to have sex unless your health care provider directs you  otherwise. Relief of Pain or Discomfort  Wear a good support bra for breast tenderness.    Take warm sitz baths to soothe any pain or discomfort caused by hemorrhoids. Use hemorrhoid cream  if your health care provider approves.    Rest with your legs elevated if you have leg cramps or low back pain.  If you develop varicose veins in your legs, wear support hose. Elevate your feet for 15 minutes, 3-4 times a day. Limit salt in your diet. Prenatal Care  Schedule your prenatal visits by the twelfth week of pregnancy. They are usually scheduled monthly at first, then more often in the last 2 months before delivery.  Write down your questions. Take them to your prenatal visits.  Keep all your prenatal visits as directed by your health care provider. Safety  Wear your seat belt at all times when driving.  Make a list of emergency phone numbers, including numbers for family, friends, the hospital, and police and fire departments. General Tips  Ask your health care provider for a referral to a local prenatal education class. Begin classes no later than at the beginning of month 6 of your pregnancy.  Ask for help if you have counseling or nutritional needs during pregnancy. Your health care provider can offer advice or refer you to specialists for help with various needs.  Do not use hot tubs, steam rooms, or saunas.  Do not douche or use tampons or scented sanitary pads.  Do not cross your legs for long periods of time.  Avoid cat litter boxes and soil used by cats. These carry germs that can cause birth defects in the baby and possibly loss of the fetus by miscarriage or stillbirth.  Avoid all smoking, herbs, alcohol, and medicines not prescribed by your health care provider. Chemicals in these affect the formation and growth of the baby.  Schedule a dentist appointment. At home, brush your teeth with a soft toothbrush and be gentle when you floss. SEEK MEDICAL CARE IF:   You have  dizziness.  You have mild pelvic cramps, pelvic pressure, or nagging pain in the abdominal area.  You have persistent nausea, vomiting, or diarrhea.  You have a bad smelling vaginal discharge.  You have pain with urination.  You notice increased swelling in your face, hands, legs, or ankles. SEEK IMMEDIATE MEDICAL CARE IF:   You have a fever.  You are leaking fluid from your vagina.  You have spotting or bleeding from your vagina.  You have severe abdominal cramping or pain.  You have rapid weight gain or loss.  You vomit blood or material that looks like coffee grounds.  You are exposed to [place] measles and have never had them.  You are exposed to fifth disease or chickenpox.  You develop a severe headache.  You have shortness of breath.  You have any kind of trauma, such as from a fall or a car accident. Document Released: 02/18/2001 Document Revised: 07/11/2013 Document Reviewed: 01/04/2013 [facility] Patient Information 2015 [place], [place]. This information is not intended to replace advice given to you by your health care provider. Make sure you discuss any questions you have with your health care provider.  Coronavirus (COVID-19) Are you at risk?  Are you at risk for the Coronavirus (COVID-19)?  To be considered HIGH RISK for Coronavirus (COVID-19), you have to meet the following criteria:  . Traveled to [place], [place], [place], [place] or [place]; or in the [place] to [place], [place], [place], or [place]; and have fever, cough, and shortness of breath within the last 2 weeks of travel OR . Been in close contact with a person diagnosed with COVID-19 within the last 2  weeks and have fever, cough, and shortness of breath . IF YOU DO NOT MEET THESE CRITERIA, YOU ARE CONSIDERED LOW RISK FOR COVID-19.  What to do if you are HIGH RISK for COVID-19?  Marland Kitchen If you are having a medical emergency, call 911. . Seek medical care right away. Before you go to a  doctor's office, urgent care or emergency department, call ahead and tell them about your recent travel, contact with someone diagnosed with COVID-19, and your symptoms. You should receive instructions from your physician's office regarding next steps of care.  . When you arrive at healthcare provider, tell the healthcare staff immediately you have returned from visiting [place], [place], [place], [place] or [place]; or traveled in the [place] to [place], [place], [place], or [place]; in the last two weeks or you have been in close contact with a person diagnosed with COVID-19 in the last 2 weeks.   . Tell the health care staff about your symptoms: fever, cough and shortness of breath. . After you have been seen by a medical provider, you will be either: o Tested for (COVID-19) and discharged home on quarantine except to seek medical care if symptoms worsen, and asked to  - Stay home and avoid contact with others until you get your results (4-5 days)  - Avoid travel on public transportation if possible (such as bus, train, or airplane) or o Sent to the Emergency Department by EMS for evaluation, COVID-19 testing, and possible admission depending on your condition and test results.  What to do if you are LOW RISK for COVID-19?  Reduce your risk of any infection by using the same precautions used for avoiding the common cold or flu:  Marland Kitchen Wash your hands often with soap and warm water for at least 20 seconds.  If soap and water are not readily available, use an alcohol-based hand sanitizer with at least 60% alcohol.  . If coughing or sneezing, cover your mouth and nose by coughing or sneezing into the elbow areas of your shirt or coat, into a tissue or into your sleeve (not your hands). . Avoid shaking hands with others and consider head nods or verbal greetings only. . Avoid touching your eyes, nose, or mouth with unwashed hands.  . Avoid close contact with people who are sick. . Avoid  places or events with large numbers of people in one location, like concerts or sporting events. . Carefully consider travel plans you have or are making. . If you are planning any travel outside or inside the [place], visit the CDC's Travelers' Health webpage for the latest health notices. . If you have some symptoms but not all symptoms, continue to monitor at home and seek medical attention if your symptoms worsen. . If you are having a medical emergency, call 911.   [place] / e-Visit: [email or web address]         [facility]: [facility]: [id number]                   [facility]: [phone]

## 2018-10-19 NOTE — Progress Notes (Signed)
INITIAL OBSTETRICAL VISIT Patient name: Anna Andrews MRN [id number]  Date of birth: 01-24-1996 Chief Complaint:   Initial Prenatal Visit  History of Present Illness:   Anna Andrews is a 23 y.o. G61P0000 African American female at [redacted]w[redacted]d by LMP c/w 9wk u/s, with an Estimated Date of Delivery: 05/09/19 being seen today for her initial obstetrical visit.   Her obstetrical history is significant for primigravida.   Today she reports no complaints.  Taking Lexapro for anxiety, cuts 10mg  tabs in 1/2, does well Patient's last menstrual period was 08/02/2018 (approximate). Last pap 08/20/16. Results were: normal Review of Systems:   Pertinent items are noted in HPI Denies cramping/contractions, leakage of fluid, vaginal bleeding, abnormal vaginal discharge w/ itching/odor/irritation, headaches, visual changes, shortness of breath, chest pain, abdominal pain, severe nausea/vomiting, or problems with urination or bowel movements unless otherwise stated above.  Pertinent History Reviewed:  Reviewed past medical,surgical, social, obstetrical and family history.  Reviewed problem list, medications and allergies. OB History  Gravida Para Term Preterm AB Living  1 0 0 0 0 0  SAB TAB Ectopic Multiple Live Births  0 0 0 0      # Outcome Date GA Lbr Len/2nd Weight Sex Delivery Anes PTL Lv  1 Current            Physical Assessment:   Vitals:   10/19/18 1032  BP: 121/83  Pulse: 87  Weight: 212 lb (96.2 kg)  Body mass index is 32.23 kg/m.       Physical Examination:  General appearance - well appearing, and in no distress  Mental status - alert, oriented to person, place, and time  Psych:  She has a normal mood and affect  Skin - warm and dry, normal color, no suspicious lesions noted  Chest - effort normal, all lung fields clear to auscultation bilaterally  Heart - normal rate and regular rhythm  Abdomen - soft, nontender  Extremities:  No swelling or varicosities noted  Thin prep pap is not  done  FHT: 162 via doppler  Results for orders placed or performed in visit on 10/19/18 (from the past 24 hour(s))  POC Urinalysis Dipstick OB   Collection Time: 10/19/18 10:51 AM  Result Value Ref Range   Color, UA     Clarity, UA     Glucose, UA Negative Negative   Bilirubin, UA     Ketones, UA neg    Spec Grav, UA     Blood, UA neg    pH, UA     POC,PROTEIN,UA Negative Negative, Trace, Small (1+), Moderate (2+), Large (3+), 4+   Urobilinogen, UA     Nitrite, UA neg    Leukocytes, UA Negative Negative   Appearance     Odor      Assessment & Plan:  1) Low-Risk Pregnancy G1P0000 at [redacted]w[redacted]d with an Estimated Date of Delivery: 05/09/19   2) Initial OB visit  3) Anxiety> on Lexapro 5mg  daily  Meds:  Meds ordered this encounter  Medications   Blood Pressure Monitor MISC    Sig: For regular home bp monitoring during pregnancy    Dispense:  1 each    Refill:  0    Dx: z34.90    Order Specific Question:   Supervising Provider    Answer:   Florian Buff [[id number]]    Initial labs obtained Continue prenatal vitamins Reviewed n/v relief measures and warning s/s to report Reviewed recommended weight gain based on pre-gravid BMI  Encouraged well-balanced diet Genetic Screening discussed: declined Cystic fibrosis, SMA, Fragile X screening discussed declined Ultrasound discussed; fetal survey: requested CCNC completed>PCM not here, form faxed Does not have home bp cuff. Rx faxed to [place]. Check bp weekly, let us know if >140/90.   Follow-up: Return in about 4 weeks (around 11/16/2018) for LROB, MyChart Video; then 7wks from now for LROB and anatomy u/s in person.   Orders Placed This Encounter  Procedures   GC/Chlamydia Probe Amp   Urine Culture   Obstetric Panel, Including HIV   Sickle cell screen   Urinalysis, Routine w reflex microscopic   Pain Management Screening Profile (10S)   POC Urinalysis Dipstick OB    Cheral MarkerKimberly R Maguadalupe Lata CNM, WHNP-BC 10/19/2018 11:30 AM

## 2018-10-20 LAB — OBSTETRIC PANEL, INCLUDING HIV
Antibody Screen: NEGATIVE
Basophils Absolute: 0 10*3/uL (ref 0.0–0.2)
Basos: 0 %
EOS (ABSOLUTE): 0.1 10*3/uL (ref 0.0–0.4)
Eos: 1 %
HIV Screen 4th Generation wRfx: NONREACTIVE
Hematocrit: 39.3 % (ref 34.0–46.6)
Hemoglobin: 12.7 g/dL (ref 11.1–15.9)
Hepatitis B Surface Ag: NEGATIVE
Immature Grans (Abs): 0.1 10*3/uL (ref 0.0–0.1)
Immature Granulocytes: 1 %
Lymphocytes Absolute: 1.4 10*3/uL (ref 0.7–3.1)
Lymphs: 13 %
MCH: 27.5 pg (ref 26.6–33.0)
MCHC: 32.3 g/dL (ref 31.5–35.7)
MCV: 85 fL (ref 79–97)
Monocytes Absolute: 0.4 10*3/uL (ref 0.1–0.9)
Monocytes: 4 %
Neutrophils Absolute: 8.5 10*3/uL — ABNORMAL HIGH (ref 1.4–7.0)
Neutrophils: 81 %
Platelets: 313 10*3/uL (ref 150–450)
RBC: 4.62 x10E6/uL (ref 3.77–5.28)
RDW: 13.2 % (ref 11.7–15.4)
RPR Ser Ql: NONREACTIVE
Rh Factor: POSITIVE
Rubella Antibodies, IGG: 1.99 index (ref >0.99)
WBC: 10.4 10*3/uL (ref 3.4–10.8)

## 2018-10-20 LAB — URINALYSIS, ROUTINE W REFLEX MICROSCOPIC
Bilirubin, UA: NEGATIVE
Glucose, UA: NEGATIVE
Ketones, UA: NEGATIVE
Leukocytes,UA: NEGATIVE
Nitrite, UA: NEGATIVE
Protein,UA: NEGATIVE
RBC, UA: NEGATIVE
Specific Gravity, UA: 1.014 (ref 1.005–1.030)
Urobilinogen, Ur: 0.2 mg/dL (ref 0.2–1.0)
pH, UA: 8.5 — ABNORMAL HIGH (ref 5.0–7.5)

## 2018-10-20 LAB — SICKLE CELL SCREEN: Sickle Cell Screen: NEGATIVE

## 2018-10-21 LAB — URINE CULTURE: Organism ID, Bacteria: NO GROWTH

## 2018-10-21 LAB — PMP SCREEN PROFILE (10S), URINE
Amphetamine Scrn, Ur: NEGATIVE ng/mL
BARBITURATE SCREEN URINE: NEGATIVE ng/mL
BENZODIAZEPINE SCREEN, URINE: NEGATIVE ng/mL
CANNABINOIDS UR QL SCN: NEGATIVE ng/mL
Cocaine (Metab) Scrn, Ur: NEGATIVE ng/mL
Creatinine(Crt), U: 80.5 mg/dL (ref 20.0–300.0)
Methadone Screen, Urine: NEGATIVE ng/mL
OXYCODONE+OXYMORPHONE UR QL SCN: NEGATIVE ng/mL
Opiate Scrn, Ur: NEGATIVE ng/mL
Ph of Urine: 7.9 (ref 4.5–8.9)
Phencyclidine Qn, Ur: NEGATIVE ng/mL
Propoxyphene Scrn, Ur: NEGATIVE ng/mL

## 2018-10-22 ENCOUNTER — Telehealth: Payer: Self-pay | Admitting: *Deleted

## 2018-10-22 ENCOUNTER — Other Ambulatory Visit: Payer: PRIVATE HEALTH INSURANCE

## 2018-10-22 ENCOUNTER — Other Ambulatory Visit: Payer: Self-pay | Admitting: Obstetrics & Gynecology

## 2018-10-22 ENCOUNTER — Other Ambulatory Visit: Payer: Self-pay

## 2018-10-22 NOTE — Telephone Encounter (Signed)
Patient states she woke up in the middle of the night to use the restroom and noticed some light spotting on the tissue.  She is having some mild cramping but states she has not been drinking enough water.  Informed patient that spotting is not uncommon in the first trimester and since it was just once, she can continue to monitor and if bleeding increases, to call our office or go to the hospital.  Pt states she would like to come to hear the baby's hear beat to ease her mind.  Will come this morning.

## 2018-10-22 NOTE — Progress Notes (Signed)
FHT -163

## 2018-10-22 NOTE — Telephone Encounter (Signed)
Erroneous encounter

## 2018-10-23 LAB — GC/CHLAMYDIA PROBE AMP
Chlamydia trachomatis, NAA: NEGATIVE
Neisseria Gonorrhoeae by PCR: NEGATIVE

## 2018-11-16 ENCOUNTER — Encounter: Payer: Self-pay | Admitting: Obstetrics & Gynecology

## 2018-11-16 ENCOUNTER — Ambulatory Visit (INDEPENDENT_AMBULATORY_CARE_PROVIDER_SITE_OTHER): Payer: PRIVATE HEALTH INSURANCE | Admitting: Obstetrics & Gynecology

## 2018-11-16 ENCOUNTER — Other Ambulatory Visit: Payer: Self-pay

## 2018-11-16 VITALS — BP 129/83 | HR 98 | Wt 215.0 lb

## 2018-11-16 DIAGNOSIS — Z3A15 15 weeks gestation of pregnancy: Secondary | ICD-10-CM

## 2018-11-16 DIAGNOSIS — Z331 Pregnant state, incidental: Secondary | ICD-10-CM

## 2018-11-16 DIAGNOSIS — Z3402 Encounter for supervision of normal first pregnancy, second trimester: Secondary | ICD-10-CM

## 2018-11-16 DIAGNOSIS — Z1389 Encounter for screening for other disorder: Secondary | ICD-10-CM

## 2018-11-16 LAB — POCT URINALYSIS DIPSTICK OB
Blood, UA: NEGATIVE
Glucose, UA: NEGATIVE
Ketones, UA: NEGATIVE
Leukocytes, UA: NEGATIVE
Nitrite, UA: NEGATIVE

## 2018-11-16 NOTE — Progress Notes (Signed)
   LOW-RISK PREGNANCY VISIT Patient name: Anna Andrews MRN [id number]  Date of birth: Oct 27, 1995 Chief Complaint:   Routine Prenatal Visit  History of Present Illness:   Anna Andrews is a 23 y.o. G67P0000 female at [redacted]w[redacted]d with an Estimated Date of Delivery: 05/09/19 being seen today for ongoing management of a low-risk pregnancy.  Today she reports no complaints. Contractions: Not present. Vag. Bleeding: None.  Movement: Present. denies leaking of fluid. Review of Systems:   Pertinent items are noted in HPI Denies abnormal vaginal discharge w/ itching/odor/irritation, headaches, visual changes, shortness of breath, chest pain, abdominal pain, severe nausea/vomiting, or problems with urination or bowel movements unless otherwise stated above. Pertinent History Reviewed:  Reviewed past medical,surgical, social, obstetrical and family history.  Reviewed problem list, medications and allergies. Physical Assessment:   Vitals:   11/16/18 1156  BP: 129/83  Pulse: 98  Weight: 215 lb (97.5 kg)  Body mass index is 32.69 kg/m.        Physical Examination:   General appearance: Well appearing, and in no distress  Mental status: Alert, oriented to person, place, and time  Skin: Warm & dry  Cardiovascular: Normal heart rate noted  Respiratory: Normal respiratory effort, no distress  Abdomen: Soft, gravid, nontender  Pelvic: Cervical exam deferred         Extremities: Edema: None  Fetal Status: Fetal Heart Rate (bpm): 147   Movement: Present    Results for orders placed or performed in visit on 11/16/18 (from the past 24 hour(s))  POC Urinalysis Dipstick OB   Collection Time: 11/16/18 12:02 PM  Result Value Ref Range   Color, UA     Clarity, UA     Glucose, UA Negative Negative   Bilirubin, UA     Ketones, UA neg    Spec Grav, UA     Blood, UA neg    pH, UA     POC,PROTEIN,UA Trace Negative, Trace, Small (1+), Moderate (2+), Large (3+), 4+   Urobilinogen, UA     Nitrite, UA neg    Leukocytes, UA Negative Negative   Appearance     Odor      Assessment & Plan:  1) Low-risk pregnancy G1P0000 at [redacted]w[redacted]d with an Estimated Date of Delivery: 05/09/19   2) Borderline BP   Meds: No orders of the defined types were placed in this encounter.  Labs/procedures today: declined genetic testing  Plan:  Continue routine obstetrical care   Reviewed: Preterm labor symptoms and general obstetric precautions including but not limited to vaginal bleeding, contractions, leaking of fluid and fetal movement were reviewed in detail with the patient.  All questions were answered.   Check bp weekly, let us know if >140/90.   Follow-up: Return in about 4 weeks (around 12/14/2018) for 20 week sono, LROB.  Orders Placed This Encounter  Procedures  . US OB Comp + 14 Wk  . POC Urinalysis Dipstick OB   Florian Buff  11/16/2018 12:26 PM

## 2018-12-07 ENCOUNTER — Other Ambulatory Visit: Payer: Self-pay | Admitting: Obstetrics & Gynecology

## 2018-12-07 ENCOUNTER — Other Ambulatory Visit: Payer: PRIVATE HEALTH INSURANCE

## 2018-12-07 DIAGNOSIS — Z3402 Encounter for supervision of normal first pregnancy, second trimester: Secondary | ICD-10-CM

## 2018-12-07 DIAGNOSIS — Z363 Encounter for antenatal screening for malformations: Secondary | ICD-10-CM

## 2018-12-08 ENCOUNTER — Ambulatory Visit (INDEPENDENT_AMBULATORY_CARE_PROVIDER_SITE_OTHER): Payer: PRIVATE HEALTH INSURANCE

## 2018-12-08 ENCOUNTER — Other Ambulatory Visit: Payer: Self-pay

## 2018-12-08 ENCOUNTER — Ambulatory Visit (INDEPENDENT_AMBULATORY_CARE_PROVIDER_SITE_OTHER): Payer: PRIVATE HEALTH INSURANCE | Admitting: Advanced Practice Midwife

## 2018-12-08 VITALS — BP 133/84 | HR 86 | Wt 215.6 lb

## 2018-12-08 DIAGNOSIS — Z363 Encounter for antenatal screening for malformations: Secondary | ICD-10-CM | POA: Diagnosis not present

## 2018-12-08 DIAGNOSIS — Z3A18 18 weeks gestation of pregnancy: Secondary | ICD-10-CM | POA: Diagnosis not present

## 2018-12-08 DIAGNOSIS — Z3402 Encounter for supervision of normal first pregnancy, second trimester: Secondary | ICD-10-CM

## 2018-12-08 DIAGNOSIS — Z331 Pregnant state, incidental: Secondary | ICD-10-CM

## 2018-12-08 DIAGNOSIS — Z1389 Encounter for screening for other disorder: Secondary | ICD-10-CM

## 2018-12-08 LAB — POCT URINALYSIS DIPSTICK OB
Blood, UA: NEGATIVE
Glucose, UA: NEGATIVE
Ketones, UA: NEGATIVE
Leukocytes, UA: NEGATIVE
Nitrite, UA: NEGATIVE
POC,PROTEIN,UA: NEGATIVE

## 2018-12-08 NOTE — Patient Instructions (Signed)

## 2018-12-08 NOTE — Progress Notes (Signed)
[place] 11+0 wks,cephalic,anterior placenta gr 0,normal ovaries bilat,cx 3.2 cm,fhr 148 bpm,EFW 253 g 69%,limited view of heart and face because of fetal position,please have pt come back for additional images,no obvious abnormalities

## 2018-12-08 NOTE — Progress Notes (Signed)
   LOW-RISK PREGNANCY VISIT Patient name: Anna Andrews MRN [id number]  Date of birth: 1995/04/09 Chief Complaint:   Routine Prenatal Visit  History of Present Illness:   SYLVANA BONK is a 23 y.o. G1P0000 female at [redacted]w[redacted]d with an Estimated Date of Delivery: 05/09/19 being seen today for ongoing management of a low-risk pregnancy and anatomy scan.  Today she reports no complaints.  .  .  Movement: Present. denies leaking of fluid. Review of Systems:   Pertinent items are noted in HPI Denies abnormal vaginal discharge w/ itching/odor/irritation, headaches, visual changes, shortness of breath, chest pain, abdominal pain, severe nausea/vomiting, or problems with urination or bowel movements unless otherwise stated above. Pertinent History Reviewed:  Reviewed past medical,surgical, social, obstetrical and family history.  Reviewed problem list, medications and allergies. Physical Assessment:   Vitals:   12/08/18 1103  BP: 133/84  Pulse: 86  Weight: 215 lb 9.6 oz (97.8 kg)  Body mass index is 32.78 kg/m.        Physical Examination:   General appearance: Well appearing, and in no distress  Mental status: Alert, oriented to person, place, and time  Skin: Warm & dry  Cardiovascular: Normal heart rate noted  Respiratory: Normal respiratory effort, no distress  Abdomen: Soft, gravid, nontender  Pelvic: Cervical exam deferred         Extremities: Edema: None  Fetal Status: Fetal Heart Rate (bpm): 148   Movement: Present     [place] 40+3 wks,cephalic,anterior placenta gr 0,normal ovaries bilat,cx 3.2 cm,fhr 148 bpm,EFW 253 g 69%,limited view of heart and face because of fetal position,please have pt come back for additional images,no obvious abnormalities   Results for orders placed or performed in visit on 12/08/18 (from the past 24 hour(s))  POC Urinalysis Dipstick OB   Collection Time: 12/08/18 11:06 AM  Result Value Ref Range   Color, UA     Clarity, UA     Glucose, UA Negative Negative    Bilirubin, UA     Ketones, UA neg    Spec Grav, UA     Blood, UA neg    pH, UA     POC,PROTEIN,UA Negative Negative, Trace, Small (1+), Moderate (2+), Large (3+), 4+   Urobilinogen, UA     Nitrite, UA neg    Leukocytes, UA Negative Negative   Appearance     Odor      Assessment & Plan:  1) Low-risk pregnancy G1P0000 at [redacted]w[redacted]d with an Estimated Date of Delivery: 05/09/19; anatomy scan today with limited views of heart & face  2) Anxiety, doing well on Lexapro 5mg  qd  3) High normal BP, rev'd strong fam hx of hypertension, mother started medication in her 82s, will continue to watch   Meds: No orders of the defined types were placed in this encounter.  Labs/procedures today: Anatomy scan  Plan:  Return in 2 wks for f/u anatomy (heart & face)  Reviewed: Preterm labor symptoms and general obstetric precautions including but not limited to vaginal bleeding, contractions, leaking of fluid and fetal movement were reviewed in detail with the patient.  All questions were answered. Has home bp cuff. Check bp weekly, let us know if >140/90.   Follow-up: Return in about 2 weeks (around 12/22/2018) for LROB, f/u anatomy u/s.  Orders Placed This Encounter  Procedures  . POC Urinalysis Dipstick OB   Myrtis Ser [facility] 12/08/2018 11:42 AM

## 2018-12-21 ENCOUNTER — Other Ambulatory Visit: Payer: Self-pay | Admitting: Advanced Practice Midwife

## 2018-12-21 DIAGNOSIS — Z0489 Encounter for examination and observation for other specified reasons: Secondary | ICD-10-CM

## 2018-12-21 DIAGNOSIS — IMO0002 Reserved for concepts with insufficient information to code with codable children: Secondary | ICD-10-CM

## 2018-12-22 ENCOUNTER — Ambulatory Visit (INDEPENDENT_AMBULATORY_CARE_PROVIDER_SITE_OTHER): Payer: PRIVATE HEALTH INSURANCE

## 2018-12-22 ENCOUNTER — Ambulatory Visit (INDEPENDENT_AMBULATORY_CARE_PROVIDER_SITE_OTHER): Payer: PRIVATE HEALTH INSURANCE | Admitting: Women's Health

## 2018-12-22 ENCOUNTER — Other Ambulatory Visit: Payer: Self-pay

## 2018-12-22 ENCOUNTER — Encounter: Payer: Self-pay | Admitting: Women's Health

## 2018-12-22 ENCOUNTER — Encounter: Payer: PRIVATE HEALTH INSURANCE | Admitting: Advanced Practice Midwife

## 2018-12-22 VITALS — BP 121/78 | HR 84 | Wt 219.0 lb

## 2018-12-22 DIAGNOSIS — Z331 Pregnant state, incidental: Secondary | ICD-10-CM

## 2018-12-22 DIAGNOSIS — Z1389 Encounter for screening for other disorder: Secondary | ICD-10-CM

## 2018-12-22 DIAGNOSIS — Z3A20 20 weeks gestation of pregnancy: Secondary | ICD-10-CM

## 2018-12-22 DIAGNOSIS — Z0489 Encounter for examination and observation for other specified reasons: Secondary | ICD-10-CM

## 2018-12-22 DIAGNOSIS — Z23 Encounter for immunization: Secondary | ICD-10-CM | POA: Diagnosis not present

## 2018-12-22 DIAGNOSIS — Z3402 Encounter for supervision of normal first pregnancy, second trimester: Secondary | ICD-10-CM

## 2018-12-22 DIAGNOSIS — IMO0002 Reserved for concepts with insufficient information to code with codable children: Secondary | ICD-10-CM

## 2018-12-22 LAB — POCT URINALYSIS DIPSTICK OB
Blood, UA: NEGATIVE
Glucose, UA: NEGATIVE
Ketones, UA: NEGATIVE
Leukocytes, UA: NEGATIVE
Nitrite, UA: NEGATIVE
POC,PROTEIN,UA: NEGATIVE

## 2018-12-22 NOTE — Progress Notes (Signed)
[place] 20+2 wks,breech,anterior placenta gr 0,fhr 129 bpm,cx 3.9 cm,svp of fluid 4.8 cm,normal right ovary,left ovary not visualized,EFW 348 g 48%,anatomy complete,no obvious abnormalities

## 2018-12-22 NOTE — Progress Notes (Signed)
   LOW-RISK PREGNANCY VISIT Patient name: Anna Andrews MRN [id number]  Date of birth: 06/04/1995 Chief Complaint:   Routine Prenatal Visit  History of Present Illness:   Anna Andrews is a 23 y.o. G77P0000 female at [redacted]w[redacted]d with an Estimated Date of Delivery: 05/09/19 being seen today for ongoing management of a low-risk pregnancy.  Today she reports no complaints. Contractions: Not present. Vag. Bleeding: None.  Movement: Present. denies leaking of fluid. Review of Systems:   Pertinent items are noted in HPI Denies abnormal vaginal discharge w/ itching/odor/irritation, headaches, visual changes, shortness of breath, chest pain, abdominal pain, severe nausea/vomiting, or problems with urination or bowel movements unless otherwise stated above. Pertinent History Reviewed:  Reviewed past medical,surgical, social, obstetrical and family history.  Reviewed problem list, medications and allergies. Physical Assessment:   Vitals:   12/22/18 1103  BP: 121/78  Pulse: 84  Weight: 219 lb (99.3 kg)  Body mass index is 33.3 kg/m.        Physical Examination:   General appearance: Well appearing, and in no distress  Mental status: Alert, oriented to person, place, and time  Skin: Warm & dry  Cardiovascular: Normal heart rate noted  Respiratory: Normal respiratory effort, no distress  Abdomen: Soft, gravid, nontender  Pelvic: Cervical exam deferred         Extremities: Edema: None  Fetal Status: Fetal Heart Rate (bpm): 129 u/s   Movement: Present    [place] 20+2 wks,breech,anterior placenta gr 0,fhr 129 bpm,cx 3.9 cm,svp of fluid 4.8 cm,normal right ovary,left ovary not visualized,EFW 348 g 48%,anatomy complete,no obvious abnormalities    Results for orders placed or performed in visit on 12/22/18 (from the past 24 hour(s))  POC Urinalysis Dipstick OB   Collection Time: 12/22/18 11:19 AM  Result Value Ref Range   Color, UA     Clarity, UA     Glucose, UA Negative Negative   Bilirubin, UA     Ketones, UA n    Spec Grav, UA     Blood, UA n    pH, UA     POC,PROTEIN,UA Negative Negative, Trace, Small (1+), Moderate (2+), Large (3+), 4+   Urobilinogen, UA     Nitrite, UA n    Leukocytes, UA Negative Negative   Appearance     Odor      Assessment & Plan:  1) Low-risk pregnancy G1P0000 at [redacted]w[redacted]d with an Estimated Date of Delivery: 05/09/19    Meds: No orders of the defined types were placed in this encounter.  Labs/procedures today: r/u anatomy u/s, flu shot  Plan:  Continue routine obstetrical care   Reviewed: Preterm labor symptoms and general obstetric precautions including but not limited to vaginal bleeding, contractions, leaking of fluid and fetal movement were reviewed in detail with the patient.  All questions were answered. Has home bp cuff. Check bp weekly, let us know if >140/90.  Wants next appt in office  Follow-up: Return in about 4 weeks (around 01/19/2019) for [place], in person, CNM.  Orders Placed This Encounter  Procedures  . Flu Vaccine QUAD 36+ mos IM  . POC Urinalysis Dipstick OB   Roma Schanz CNM, [facility] 12/22/2018 11:47 AM

## 2018-12-22 NOTE — Patient Instructions (Signed)
Anna Andrews, I greatly value your feedback.  If you receive a survey following your visit with [place] today, we appreciate you taking the time to fill it out.  Thanks, [name], CNM, [facility]  [place]!!! It is now [place] at [facility] ([place], [place] [address]) Entrance located off of [address] parking   Go to [email or web address] to register for FREE online childbirth classes  [place] Pediatricians/Family Doctors:  [facility] [place] [phone]                 [place] [phone] (usually not accepting new patients unless you have family there already, you are always welcome to call and ask)       [facility] Department [phone]       [facility] Pediatricians/Family Doctors:   [facility]: [phone]  [facility]: [phone]  [facility]: [place] Doctors:   [facility]: [facility]: [place]:  [place]: [phone]    Home Blood Pressure Monitoring for Patients   Your provider has recommended that you check your blood pressure (BP) at least once a week at home. If you do not have a blood pressure cuff at home, one will be provided for you. Contact your provider if you have not received your monitor within 1 week.   Helpful Tips for Accurate Home Blood Pressure Checks  . Don't smoke, exercise, or drink caffeine 30 minutes before checking your BP . Use the restroom before checking your BP (a full bladder can raise your pressure) . Relax in a comfortable upright chair . Feet on the ground . Left arm resting comfortably on a flat surface at the level of your heart . Legs uncrossed . Back supported . Sit quietly and don't talk . Place the cuff on  your bare arm . Adjust snuggly, so that only two fingertips can fit between your skin and the top of the cuff . Check 2 readings separated by at least one minute . Keep a log of your BP readings . For a visual, please reference this diagram: http://ccnc.care/bpdiagram  Provider Name: [facility]     Phone: [phone]  Zone 1: ALL CLEAR  Continue to monitor your symptoms:  . BP reading is less than 140 (top number) or less than 90 (bottom number)  . No right upper stomach pain . No headaches or seeing spots . No feeling nauseated or throwing up . No swelling in face and hands  Zone 2: CAUTION Call your doctor's office for any of the following:  . BP reading is greater than 140 (top number) or greater than 90 (bottom number)  . Stomach pain under your ribs in the middle or right side . Headaches or seeing spots . Feeling nauseated or throwing up . Swelling in face and hands  Zone 3: EMERGENCY  Seek immediate medical care if you have any of the following:  . BP reading is greater than160 (top number) or greater than 110 (bottom number) . Severe headaches not improving with Tylenol . Serious difficulty catching your breath . Any worsening symptoms from Zone 2     Second Trimester of Pregnancy The second trimester is from week 14 through week 27 (months 4 through 6). The second trimester is  often a time when you feel your best. Your body has adjusted to being pregnant, and you begin to feel better physically. Usually, morning sickness has lessened or quit completely, you may have more energy, and you may have an increase in appetite. The second trimester is also a time when the fetus is growing rapidly. At the end of the sixth month, the fetus is about 9 inches long and weighs about 1 pounds. You will likely begin to feel the baby move (quickening) between 16 and 20 weeks of pregnancy. Body changes during your second trimester Your body continues to go through many changes  during your second trimester. The changes vary from woman to woman.  Your weight will continue to increase. You will notice your lower abdomen bulging out.  You may begin to get stretch marks on your hips, abdomen, and breasts.  You may develop headaches that can be relieved by medicines. The medicines should be approved by your health care provider.  You may urinate more often because the fetus is pressing on your bladder.  You may develop or continue to have heartburn as a result of your pregnancy.  You may develop constipation because certain hormones are causing the muscles that push waste through your intestines to slow down.  You may develop hemorrhoids or swollen, bulging veins (varicose veins).  You may have back pain. This is caused by: ? Weight gain. ? Pregnancy hormones that are relaxing the joints in your pelvis. ? A shift in weight and the muscles that support your balance.  Your breasts will continue to grow and they will continue to become tender.  Your gums may bleed and may be sensitive to brushing and flossing.  Dark spots or blotches (chloasma, mask of pregnancy) may develop on your face. This will likely fade after the baby is born.  A dark line from your belly button to the pubic area (linea nigra) may appear. This will likely fade after the baby is born.  You may have changes in your hair. These can include thickening of your hair, rapid growth, and changes in texture. Some women also have hair loss during or after pregnancy, or hair that feels dry or thin. Your hair will most likely return to normal after your baby is born.  What to expect at prenatal visits During a routine prenatal visit:  You will be weighed to make sure you and the fetus are growing normally.  Your blood pressure will be taken.  Your abdomen will be measured to track your baby's growth.  The fetal heartbeat will be listened to.  Any test results from the previous visit will be  discussed.  Your health care provider may ask you:  How you are feeling.  If you are feeling the baby move.  If you have had any abnormal symptoms, such as leaking fluid, bleeding, severe headaches, or abdominal cramping.  If you are using any tobacco products, including cigarettes, chewing tobacco, and electronic cigarettes.  If you have any questions.  Other tests that may be performed during your second trimester include:  Blood tests that check for: ? Low iron levels (anemia). ? High blood sugar that affects pregnant women (gestational diabetes) between 41 and 28 weeks. ? Rh antibodies. This is to check for a protein on red blood cells (Rh factor).  Urine tests to check for infections, diabetes, or protein in the urine.  An ultrasound to confirm the proper growth and development of the baby.  An amniocentesis to  check for possible genetic problems.  Fetal screens for spina bifida and Down syndrome.  HIV (human immunodeficiency virus) testing. Routine prenatal testing includes screening for HIV, unless you choose not to have this test.  Follow these instructions at home: Medicines  Follow your health care provider's instructions regarding medicine use. Specific medicines may be either safe or unsafe to take during pregnancy.  Take a prenatal vitamin that contains at least 600 micrograms (mcg) of folic acid.  If you develop constipation, try taking a stool softener if your health care provider approves. Eating and drinking  Eat a balanced diet that includes fresh fruits and vegetables, whole grains, good sources of protein such as meat, eggs, or tofu, and low-fat dairy. Your health care provider will help you determine the amount of weight gain that is right for you.  Avoid raw meat and uncooked cheese. These carry germs that can cause birth defects in the baby.  If you have low calcium intake from food, talk to your health care provider about whether you should take a  daily calcium supplement.  Limit foods that are high in fat and processed sugars, such as fried and sweet foods.  To prevent constipation: ? Drink enough fluid to keep your urine clear or pale yellow. ? Eat foods that are high in fiber, such as fresh fruits and vegetables, whole grains, and beans. Activity  Exercise only as directed by your health care provider. Most women can continue their usual exercise routine during pregnancy. Try to exercise for 30 minutes at least 5 days a week. Stop exercising if you experience uterine contractions.  Avoid heavy lifting, wear low heel shoes, and practice good posture.  A sexual relationship may be continued unless your health care provider directs you otherwise. Relieving pain and discomfort  Wear a good support bra to prevent discomfort from breast tenderness.  Take warm sitz baths to soothe any pain or discomfort caused by hemorrhoids. Use hemorrhoid cream if your health care provider approves.  Rest with your legs elevated if you have leg cramps or low back pain.  If you develop varicose veins, wear support hose. Elevate your feet for 15 minutes, 3-4 times a day. Limit salt in your diet. Prenatal Care  Write down your questions. Take them to your prenatal visits.  Keep all your prenatal visits as told by your health care provider. This is important. Safety  Wear your seat belt at all times when driving.  Make a list of emergency phone numbers, including numbers for family, friends, the hospital, and police and fire departments. General instructions  Ask your health care provider for a referral to a local prenatal education class. Begin classes no later than the beginning of month 6 of your pregnancy.  Ask for help if you have counseling or nutritional needs during pregnancy. Your health care provider can offer advice or refer you to specialists for help with various needs.  Do not use hot tubs, steam rooms, or saunas.  Do not  douche or use tampons or scented sanitary pads.  Do not cross your legs for long periods of time.  Avoid cat litter boxes and soil used by cats. These carry germs that can cause birth defects in the baby and possibly loss of the fetus by miscarriage or stillbirth.  Avoid all smoking, herbs, alcohol, and unprescribed drugs. Chemicals in these products can affect the formation and growth of the baby.  Do not use any products that contain nicotine or tobacco, such as  cigarettes and e-cigarettes. If you need help quitting, ask your health care provider.  Visit your dentist if you have not gone yet during your pregnancy. Use a soft toothbrush to brush your teeth and be gentle when you floss. Contact a health care provider if:  You have dizziness.  You have mild pelvic cramps, pelvic pressure, or nagging pain in the abdominal area.  You have persistent nausea, vomiting, or diarrhea.  You have a bad smelling vaginal discharge.  You have pain when you urinate. Get help right away if:  You have a fever.  You are leaking fluid from your vagina.  You have spotting or bleeding from your vagina.  You have severe abdominal cramping or pain.  You have rapid weight gain or weight loss.  You have shortness of breath with chest pain.  You notice sudden or extreme swelling of your face, hands, ankles, feet, or legs.  You have not felt your baby move in over an hour.  You have severe headaches that do not go away when you take medicine.  You have vision changes. Summary  The second trimester is from week 14 through week 27 (months 4 through 6). It is also a time when the fetus is growing rapidly.  Your body goes through many changes during pregnancy. The changes vary from woman to woman.  Avoid all smoking, herbs, alcohol, and unprescribed drugs. These chemicals affect the formation and growth your baby.  Do not use any tobacco products, such as cigarettes, chewing tobacco, and  e-cigarettes. If you need help quitting, ask your health care provider.  Contact your health care provider if you have any questions. Keep all prenatal visits as told by your health care provider. This is important. This information is not intended to replace advice given to you by your health care provider. Make sure you discuss any questions you have with your health care provider. Document Released: 02/18/2001 Document Revised: 08/02/2015 Document Reviewed: 04/27/2012 Elsevier Interactive Patient Education  2017 [employer].

## 2019-01-07 ENCOUNTER — Other Ambulatory Visit: Payer: Self-pay

## 2019-01-07 DIAGNOSIS — Z20822 Contact with and (suspected) exposure to covid-19: Secondary | ICD-10-CM

## 2019-01-08 LAB — NOVEL CORONAVIRUS, NAA: SARS-CoV-2, NAA: NOT DETECTED

## 2019-01-19 ENCOUNTER — Other Ambulatory Visit: Payer: Self-pay

## 2019-01-19 ENCOUNTER — Ambulatory Visit (INDEPENDENT_AMBULATORY_CARE_PROVIDER_SITE_OTHER): Payer: PRIVATE HEALTH INSURANCE | Admitting: Advanced Practice Midwife

## 2019-01-19 VITALS — Wt 224.0 lb

## 2019-01-19 DIAGNOSIS — Z3A24 24 weeks gestation of pregnancy: Secondary | ICD-10-CM

## 2019-01-19 DIAGNOSIS — Z1389 Encounter for screening for other disorder: Secondary | ICD-10-CM

## 2019-01-19 DIAGNOSIS — Z331 Pregnant state, incidental: Secondary | ICD-10-CM

## 2019-01-19 DIAGNOSIS — Z3402 Encounter for supervision of normal first pregnancy, second trimester: Secondary | ICD-10-CM

## 2019-01-19 LAB — POCT URINALYSIS DIPSTICK OB
Blood, UA: NEGATIVE
Glucose, UA: NEGATIVE
Ketones, UA: NEGATIVE
Leukocytes, UA: NEGATIVE
Nitrite, UA: NEGATIVE
POC,PROTEIN,UA: NEGATIVE

## 2019-01-19 NOTE — Progress Notes (Signed)
   LOW-RISK PREGNANCY VISIT Patient name: Anna Andrews MRN [id number]  Date of birth: 1995-08-31 Chief Complaint:   Routine Prenatal Visit  History of Present Illness:   Anna Andrews is a 23 y.o. G70P0000 female at [redacted]w[redacted]d with an Estimated Date of Delivery: 05/09/19 being seen today for ongoing management of a low-risk pregnancy.  Today she reports no complaints. Contractions: Not present. Vag. Bleeding: None.  Movement: Present. denies leaking of fluid. Review of Systems:   Pertinent items are noted in HPI Denies abnormal vaginal discharge w/ itching/odor/irritation, headaches, visual changes, shortness of breath, chest pain, abdominal pain, severe nausea/vomiting, or problems with urination or bowel movements unless otherwise stated above. Pertinent History Reviewed:  Reviewed past medical,surgical, social, obstetrical and family history.  Reviewed problem list, medications and allergies. Physical Assessment:   Vitals:   01/19/19 1056  Weight: 224 lb (101.6 kg)  Body mass index is 34.06 kg/m.        Physical Examination:   General appearance: Well appearing, and in no distress  Mental status: Alert, oriented to person, place, and time  Skin: Warm & dry  Cardiovascular: Normal heart rate noted  Respiratory: Normal respiratory effort, no distress  Abdomen: Soft, gravid, nontender  Pelvic: Cervical exam deferred         Extremities: Edema: None  Fetal Status: Fetal Heart Rate (bpm): 145 Fundal Height: 23 cm Movement: Present    Results for orders placed or performed in visit on 01/19/19 (from the past 24 hour(s))  POC Urinalysis Dipstick OB   Collection Time: 01/19/19 11:02 AM  Result Value Ref Range   Color, UA     Clarity, UA     Glucose, UA Negative Negative   Bilirubin, UA     Ketones, UA neg    Spec Grav, UA     Blood, UA neg    pH, UA     POC,PROTEIN,UA Negative Negative, Trace, Small (1+), Moderate (2+), Large (3+), 4+   Urobilinogen, UA     Nitrite, UA neg    Leukocytes, UA Negative Negative   Appearance     Odor      Assessment & Plan:  1) Low-risk pregnancy G1P0000 at [redacted]w[redacted]d with an Estimated Date of Delivery: 05/09/19   2) Anxiety, doing well on Lexapro 5mg    Meds: No orders of the defined types were placed in this encounter.  Labs/procedures today: none  Plan:  Continue routine obstetrical care   Reviewed: Preterm labor symptoms and general obstetric precautions including but not limited to vaginal bleeding, contractions, leaking of fluid and fetal movement were reviewed in detail with the patient.  All questions were answered. Has home bp cuff. Check bp weekly, let us know if >140/90.   Follow-up: Return in about 2 weeks (around 02/02/2019) for LROB, in person, PN2.  Orders Placed This Encounter  Procedures  . POC Urinalysis Dipstick OB   Myrtis Ser [facility] 01/19/2019 11:17 AM

## 2019-01-19 NOTE — Patient Instructions (Signed)
Anna Andrews, I greatly value your feedback.  If you receive a survey following your visit with [place] today, we appreciate you taking the time to fill it out.  Thanks, Derrill Memo CNM  [place]!!! It is now [facility] [place] at [facility] ([place], [place] [address]) Entrance located off of [address] parking   Go to [email or web address] to register for FREE online childbirth classes  [place] Pediatricians/Family Doctors:  [facility] [place] [phone]                 [place] [phone] (usually not accepting new patients unless you have family there already, you are always welcome to call and ask)       [facility] Department [phone]       [facility] Pediatricians/Family Doctors:   [facility]: [phone]  [facility]: [phone]  [facility]: [place] Doctors:   [facility]: [facility]: [place]:  [place]: [phone]    Home Blood Pressure Monitoring for Patients   Your provider has recommended that you check your blood pressure (BP) at least once a week at home. If you do not have a blood pressure cuff at home, one will be provided for you. Contact your provider if you have not received your monitor within 1 week.   Helpful Tips for Accurate Home Blood Pressure Checks   Don't smoke, exercise, or drink caffeine 30 minutes before checking your BP  Use the restroom before checking your BP (a full bladder can raise your pressure)  Relax in a comfortable upright chair  Feet on the ground  Left arm resting comfortably on a flat surface at the level of your heart  Legs uncrossed  Back supported  Sit quietly and don't talk  Place the cuff on your bare  arm  Adjust snuggly, so that only two fingertips can fit between your skin and the top of the cuff  Check 2 readings separated by at least one minute  Keep a log of your BP readings  For a visual, please reference this diagram: http://ccnc.care/bpdiagram  Provider Name: [facility]     Phone: [phone]  Zone 1: ALL CLEAR  Continue to monitor your symptoms:   BP reading is less than 140 (top number) or less than 90 (bottom number)   No right upper stomach pain  No headaches or seeing spots  No feeling nauseated or throwing up  No swelling in face and hands  Zone 2: CAUTION Call your doctor's office for any of the following:   BP reading is greater than 140 (top number) or greater than 90 (bottom number)   Stomach pain under your ribs in the middle or right side  Headaches or seeing spots  Feeling nauseated or throwing up  Swelling in face and hands  Zone 3: EMERGENCY  Seek immediate medical care if you have any of the following:   BP reading is greater than160 (top number) or greater than 110 (bottom number)  Severe headaches not improving with Tylenol  Serious difficulty catching your breath  Any worsening symptoms from Zone 2     Second Trimester of Pregnancy The second trimester is from week 14 through week 27 (months 4 through 6). The second trimester is often  a time when you feel your best. Your body has adjusted to being pregnant, and you begin to feel better physically. Usually, morning sickness has lessened or quit completely, you may have more energy, and you may have an increase in appetite. The second trimester is also a time when the fetus is growing rapidly. At the end of the sixth month, the fetus is about 9 inches long and weighs about 1 pounds. You will likely begin to feel the baby move (quickening) between 16 and 20 weeks of pregnancy. Body changes during your second trimester Your body continues to go through many changes during your  second trimester. The changes vary from woman to woman.  Your weight will continue to increase. You will notice your lower abdomen bulging out.  You may begin to get stretch marks on your hips, abdomen, and breasts.  You may develop headaches that can be relieved by medicines. The medicines should be approved by your health care provider.  You may urinate more often because the fetus is pressing on your bladder.  You may develop or continue to have heartburn as a result of your pregnancy.  You may develop constipation because certain hormones are causing the muscles that push waste through your intestines to slow down.  You may develop hemorrhoids or swollen, bulging veins (varicose veins).  You may have back pain. This is caused by: ? Weight gain. ? Pregnancy hormones that are relaxing the joints in your pelvis. ? A shift in weight and the muscles that support your balance.  Your breasts will continue to grow and they will continue to become tender.  Your gums may bleed and may be sensitive to brushing and flossing.  Dark spots or blotches (chloasma, mask of pregnancy) may develop on your face. This will likely fade after the baby is born.  A dark line from your belly button to the pubic area (linea nigra) may appear. This will likely fade after the baby is born.  You may have changes in your hair. These can include thickening of your hair, rapid growth, and changes in texture. Some women also have hair loss during or after pregnancy, or hair that feels dry or thin. Your hair will most likely return to normal after your baby is born.  What to expect at prenatal visits During a routine prenatal visit:  You will be weighed to make sure you and the fetus are growing normally.  Your blood pressure will be taken.  Your abdomen will be measured to track your baby's growth.  The fetal heartbeat will be listened to.  Any test results from the previous visit will be  discussed.  Your health care provider may ask you:  How you are feeling.  If you are feeling the baby move.  If you have had any abnormal symptoms, such as leaking fluid, bleeding, severe headaches, or abdominal cramping.  If you are using any tobacco products, including cigarettes, chewing tobacco, and electronic cigarettes.  If you have any questions.  Other tests that may be performed during your second trimester include:  Blood tests that check for: ? Low iron levels (anemia). ? High blood sugar that affects pregnant women (gestational diabetes) between 77 and 28 weeks. ? Rh antibodies. This is to check for a protein on red blood cells (Rh factor).  Urine tests to check for infections, diabetes, or protein in the urine.  An ultrasound to confirm the proper growth and development of the baby.  An amniocentesis to check  for possible genetic problems.  Fetal screens for spina bifida and Down syndrome.  HIV (human immunodeficiency virus) testing. Routine prenatal testing includes screening for HIV, unless you choose not to have this test.  Follow these instructions at home: Medicines  Follow your health care provider's instructions regarding medicine use. Specific medicines may be either safe or unsafe to take during pregnancy.  Take a prenatal vitamin that contains at least 600 micrograms (mcg) of folic acid.  If you develop constipation, try taking a stool softener if your health care provider approves. Eating and drinking  Eat a balanced diet that includes fresh fruits and vegetables, whole grains, good sources of protein such as meat, eggs, or tofu, and low-fat dairy. Your health care provider will help you determine the amount of weight gain that is right for you.  Avoid raw meat and uncooked cheese. These carry germs that can cause birth defects in the baby.  If you have low calcium intake from food, talk to your health care provider about whether you should take a  daily calcium supplement.  Limit foods that are high in fat and processed sugars, such as fried and sweet foods.  To prevent constipation: ? Drink enough fluid to keep your urine clear or pale yellow. ? Eat foods that are high in fiber, such as fresh fruits and vegetables, whole grains, and beans. Activity  Exercise only as directed by your health care provider. Most women can continue their usual exercise routine during pregnancy. Try to exercise for 30 minutes at least 5 days a week. Stop exercising if you experience uterine contractions.  Avoid heavy lifting, wear low heel shoes, and practice good posture.  A sexual relationship may be continued unless your health care provider directs you otherwise. Relieving pain and discomfort  Wear a good support bra to prevent discomfort from breast tenderness.  Take warm sitz baths to soothe any pain or discomfort caused by hemorrhoids. Use hemorrhoid cream if your health care provider approves.  Rest with your legs elevated if you have leg cramps or low back pain.  If you develop varicose veins, wear support hose. Elevate your feet for 15 minutes, 3-4 times a day. Limit salt in your diet. Prenatal Care  Write down your questions. Take them to your prenatal visits.  Keep all your prenatal visits as told by your health care provider. This is important. Safety  Wear your seat belt at all times when driving.  Make a list of emergency phone numbers, including numbers for family, friends, the hospital, and police and fire departments. General instructions  Ask your health care provider for a referral to a local prenatal education class. Begin classes no later than the beginning of month 6 of your pregnancy.  Ask for help if you have counseling or nutritional needs during pregnancy. Your health care provider can offer advice or refer you to specialists for help with various needs.  Do not use hot tubs, steam rooms, or saunas.  Do not  douche or use tampons or scented sanitary pads.  Do not cross your legs for long periods of time.  Avoid cat litter boxes and soil used by cats. These carry germs that can cause birth defects in the baby and possibly loss of the fetus by miscarriage or stillbirth.  Avoid all smoking, herbs, alcohol, and unprescribed drugs. Chemicals in these products can affect the formation and growth of the baby.  Do not use any products that contain nicotine or tobacco, such as cigarettes  and e-cigarettes. If you need help quitting, ask your health care provider.  Visit your dentist if you have not gone yet during your pregnancy. Use a soft toothbrush to brush your teeth and be gentle when you floss. Contact a health care provider if:  You have dizziness.  You have mild pelvic cramps, pelvic pressure, or nagging pain in the abdominal area.  You have persistent nausea, vomiting, or diarrhea.  You have a bad smelling vaginal discharge.  You have pain when you urinate. Get help right away if:  You have a fever.  You are leaking fluid from your vagina.  You have spotting or bleeding from your vagina.  You have severe abdominal cramping or pain.  You have rapid weight gain or weight loss.  You have shortness of breath with chest pain.  You notice sudden or extreme swelling of your face, hands, ankles, feet, or legs.  You have not felt your baby move in over an hour.  You have severe headaches that do not go away when you take medicine.  You have vision changes. Summary  The second trimester is from week 14 through week 27 (months 4 through 6). It is also a time when the fetus is growing rapidly.  Your body goes through many changes during pregnancy. The changes vary from woman to woman.  Avoid all smoking, herbs, alcohol, and unprescribed drugs. These chemicals affect the formation and growth your baby.  Do not use any tobacco products, such as cigarettes, chewing tobacco, and  e-cigarettes. If you need help quitting, ask your health care provider.  Contact your health care provider if you have any questions. Keep all prenatal visits as told by your health care provider. This is important. This information is not intended to replace advice given to you by your health care provider. Make sure you discuss any questions you have with your health care provider. Document Released: 02/18/2001 Document Revised: 08/02/2015 Document Reviewed: 04/27/2012 Elsevier Interactive Patient Education  2017 [place] FLU! Because you are pregnant, we at [facility], along with the Centers for Disease Control (CDC), recommend that you receive the flu vaccine to protect yourself and your baby from the flu. The flu is more likely to cause severe illness in pregnant women than in women of reproductive age who are not pregnant. Changes in the immune system, heart, and lungs during pregnancy make pregnant women (and women up to two weeks postpartum) more prone to severe illness from flu, including illness resulting in hospitalization. Flu also may be harmful for a pregnant womans developing baby. A common flu symptom is fever, which may be associated with neural tube defects and other adverse outcomes for a developing baby. Getting vaccinated can also help protect a baby after birth from flu. (Mom passes antibodies onto the developing baby during her pregnancy.)  A Flu Vaccine is the Best Protection Against Flu Getting a flu vaccine is the first and most important step in protecting against flu. Pregnant women should get a flu shot and not the live attenuated influenza vaccine (LAIV), also known as nasal spray flu vaccine. Flu vaccines given during pregnancy help protect both the mother and her baby from flu. Vaccination has been shown to reduce the risk of flu-associated acute respiratory infection in pregnant women by up to one-half. A 2018 study showed  that getting a flu shot reduced a pregnant womans risk of being hospitalized with flu by an average of 40  percent. Pregnant women who get a flu vaccine are also helping to protect their babies from flu illness for the first several months after their birth, when they are too young to get vaccinated.   A Long Record of Safety for Flu Shots in Pregnant Women Flu shots have been given to millions of pregnant women over many years with a good safety record. There is a lot of evidence that flu vaccines can be given safely during pregnancy; though these data are limited for the first trimester. The CDC recommends that pregnant women get vaccinated during any trimester of their pregnancy. It is very important for pregnant women to get the flu shot.   Other Preventive Actions In addition to getting a flu shot, pregnant women should take the same everyday preventive actions the CDC recommends of everyone, including covering coughs, washing hands often, and avoiding people who are sick.  Symptoms and Treatment If you get sick with flu symptoms call your doctor right away. There are antiviral drugs that can treat flu illness and prevent serious flu complications. The CDC recommends prompt treatment for people who have influenza infection or suspected influenza infection and who are at high risk of serious flu complications, such as people with asthma, diabetes (including gestational diabetes), or heart disease. Early treatment of influenza in hospitalized pregnant women has been shown to reduce the length of the hospital stay.  Symptoms Flu symptoms include fever, cough, sore throat, runny or stuffy nose, body aches, headache, chills and fatigue. Some people may also have vomiting and diarrhea. People may be infected with the flu and have respiratory symptoms without a fever.  Early Treatment is Important for Pregnant Women Treatment should begin as soon as possible because antiviral drugs work best when  started early (within 48 hours after symptoms start). Antiviral drugs can make your flu illness milder and make you feel better faster. They may also prevent serious health problems that can result from flu illness. Oral oseltamivir (Tamiflu) is the preferred treatment for pregnant women because it has the most studies available to suggest that it is safe and beneficial. Antiviral drugs require a prescription from your provider. Having a fever caused by flu infection or other infections early in pregnancy may be linked to birth defects in a baby. In addition to taking antiviral drugs, pregnant women who get a fever should treat their fever with Tylenol (acetaminophen) and contact their provider immediately.  When to [place] If you are pregnant and have any of these signs, seek care immediately:  Difficulty breathing or shortness of breath  Pain or pressure in the chest or abdomen  Sudden dizziness  Confusion  Severe or persistent vomiting  High fever that is not responding to Tylenol (or store brand equivalent)  Decreased or no movement of your baby  [email or web address]

## 2019-02-02 ENCOUNTER — Other Ambulatory Visit: Payer: Self-pay

## 2019-02-02 ENCOUNTER — Other Ambulatory Visit: Payer: PRIVATE HEALTH INSURANCE

## 2019-02-02 ENCOUNTER — Ambulatory Visit (INDEPENDENT_AMBULATORY_CARE_PROVIDER_SITE_OTHER): Payer: PRIVATE HEALTH INSURANCE | Admitting: Medical

## 2019-02-02 ENCOUNTER — Encounter: Payer: Self-pay | Admitting: Medical

## 2019-02-02 VITALS — BP 129/85 | HR 102 | Wt 223.0 lb

## 2019-02-02 DIAGNOSIS — Z3A26 26 weeks gestation of pregnancy: Secondary | ICD-10-CM

## 2019-02-02 DIAGNOSIS — Z1389 Encounter for screening for other disorder: Secondary | ICD-10-CM

## 2019-02-02 DIAGNOSIS — F419 Anxiety disorder, unspecified: Secondary | ICD-10-CM

## 2019-02-02 DIAGNOSIS — Z20822 Contact with and (suspected) exposure to covid-19: Secondary | ICD-10-CM

## 2019-02-02 DIAGNOSIS — Z131 Encounter for screening for diabetes mellitus: Secondary | ICD-10-CM

## 2019-02-02 DIAGNOSIS — Z3402 Encounter for supervision of normal first pregnancy, second trimester: Secondary | ICD-10-CM

## 2019-02-02 LAB — POCT URINALYSIS DIPSTICK OB
Blood, UA: NEGATIVE
Glucose, UA: NEGATIVE
Ketones, UA: NEGATIVE
Nitrite, UA: NEGATIVE
POC,PROTEIN,UA: NEGATIVE

## 2019-02-02 NOTE — Patient Instructions (Signed)

## 2019-02-02 NOTE — Progress Notes (Signed)
   PRENATAL VISIT NOTE  Subjective:  Anna Andrews is a 23 y.o. G1P0000 at [redacted]w[redacted]d being seen today for ongoing prenatal care.  She is currently monitored for the following issues for this low-risk pregnancy and has Supervision of normal first pregnancy and Anxiety on their problem list.  Patient reports no complaints.  Contractions: Not present. Vag. Bleeding: None.  Movement: Present. Denies leaking of fluid.   The following portions of the patient's history were reviewed and updated as appropriate: allergies, current medications, past family history, past medical history, past social history, past surgical history and problem list.   Objective:   Vitals:   02/02/19 0938  BP: 129/85  Pulse: (!) 102  Weight: 223 lb (101.2 kg)    Fetal Status: Fetal Heart Rate (bpm): 137 Fundal Height: 26 cm Movement: Present     General:  Alert, oriented and cooperative. Patient is in no acute distress.  Skin: Skin is warm and dry. No rash noted.   Cardiovascular: Normal heart rate noted  Respiratory: Normal respiratory effort, no problems with respiration noted  Abdomen: Soft, gravid, appropriate for gestational age.  Pain/Pressure: Absent     Pelvic: Cervical exam deferred        Extremities: Normal range of motion.  Edema: None  Mental Status: Normal mood and affect. Normal behavior. Normal judgment and thought content.   Assessment and Plan:  Pregnancy: G1P0000 at [redacted]w[redacted]d 1. Screening for genitourinary condition - POC Urinalysis Dipstick OB  2. Encounter for supervision of normal first pregnancy in second trimester - Doing well, no complaints  - 2 hour GTT today  - Discussed TDap, would like to wait until next appointment   3. Anxiety - Taking 1/2 tab Lexapro and symptoms are well-controlled  Preterm labor symptoms and general obstetric precautions including but not limited to vaginal bleeding, contractions, leaking of fluid and fetal movement were reviewed in detail with the patient.  Please refer to After Visit Summary for other counseling recommendations.   Return in about 2 weeks (around 02/16/2019) for LOB.  Future Appointments  Date Time Provider [place]  02/02/2019 10:30 AM Danielle Rankin [facility]  02/16/2019 11:30 AM Roma Schanz, CNM [facility]    Kerry Hough, PA-C

## 2019-02-03 LAB — CBC
Hematocrit: 37.2 % (ref 34.0–46.6)
Hemoglobin: 12.2 g/dL (ref 11.1–15.9)
MCH: 28.4 pg (ref 26.6–33.0)
MCHC: 32.8 g/dL (ref 31.5–35.7)
MCV: 87 fL (ref 79–97)
Platelets: 233 10*3/uL (ref 150–450)
RBC: 4.29 x10E6/uL (ref 3.77–5.28)
RDW: 13.8 % (ref 11.7–15.4)
WBC: 10.5 10*3/uL (ref 3.4–10.8)

## 2019-02-03 LAB — GLUCOSE TOLERANCE, 2 HOURS W/ 1HR
Glucose, 1 hour: 167 mg/dL (ref 65–179)
Glucose, 2 hour: 96 mg/dL (ref 65–152)
Glucose, Fasting: 78 mg/dL (ref 65–91)

## 2019-02-03 LAB — RPR: RPR Ser Ql: NONREACTIVE

## 2019-02-03 LAB — ANTIBODY SCREEN: Antibody Screen: NEGATIVE

## 2019-02-03 LAB — HIV ANTIBODY (ROUTINE TESTING W REFLEX): HIV Screen 4th Generation wRfx: NONREACTIVE

## 2019-02-04 ENCOUNTER — Telehealth: Payer: Self-pay

## 2019-02-04 LAB — NOVEL CORONAVIRUS, NAA: SARS-CoV-2, NAA: NOT DETECTED

## 2019-02-04 NOTE — Telephone Encounter (Signed)
Patient called requesting her COVID-19 test result 02/02/2019.  She was informed that her test was still pending.  She will call tomorrow.

## 2019-02-14 ENCOUNTER — Encounter: Payer: Self-pay | Admitting: *Deleted

## 2019-02-16 ENCOUNTER — Other Ambulatory Visit: Payer: Self-pay

## 2019-02-16 ENCOUNTER — Encounter: Payer: Self-pay | Admitting: Women's Health

## 2019-02-16 ENCOUNTER — Ambulatory Visit (INDEPENDENT_AMBULATORY_CARE_PROVIDER_SITE_OTHER): Payer: PRIVATE HEALTH INSURANCE | Admitting: Women's Health

## 2019-02-16 VITALS — BP 129/83 | HR 95 | Wt 226.0 lb

## 2019-02-16 DIAGNOSIS — Z3403 Encounter for supervision of normal first pregnancy, third trimester: Secondary | ICD-10-CM

## 2019-02-16 DIAGNOSIS — Z331 Pregnant state, incidental: Secondary | ICD-10-CM

## 2019-02-16 DIAGNOSIS — Z1389 Encounter for screening for other disorder: Secondary | ICD-10-CM

## 2019-02-16 DIAGNOSIS — Z3A28 28 weeks gestation of pregnancy: Secondary | ICD-10-CM

## 2019-02-16 DIAGNOSIS — Z23 Encounter for immunization: Secondary | ICD-10-CM

## 2019-02-16 LAB — POCT URINALYSIS DIPSTICK OB
Blood, UA: NEGATIVE
Glucose, UA: NEGATIVE
Ketones, UA: NEGATIVE
Leukocytes, UA: NEGATIVE
Nitrite, UA: NEGATIVE
POC,PROTEIN,UA: NEGATIVE

## 2019-02-16 NOTE — Patient Instructions (Signed)
Anna Andrews, I greatly value your feedback.  If you receive a survey following your visit with [place] today, we appreciate you taking the time to fill it out.  Thanks, [name], CNM, [facility]  [place]!!! It is now [place] [facility] ([place], [place] [address]) Entrance located off of [address] parking    Go to [email or web address] to register for FREE online childbirth classes   Call the office [phone]) or go to [facility] if:  You begin to have strong, frequent contractions  Your water breaks.  Sometimes it is a big gush of fluid, sometimes it is just a trickle that keeps getting your panties wet or running down your legs  You have vaginal bleeding.  It is normal to have a small amount of spotting if your cervix was checked.   You don't feel your baby moving like normal.  If you don't, get you something to eat and drink and lay down and focus on feeling your baby move.  You should feel at least 10 movements in 2 hours.  If you don't, you should call the office or go to [facility].    Tdap Vaccine  It is recommended that you get the Tdap vaccine during the third trimester of EACH pregnancy to help protect your baby from getting pertussis (whooping cough)  27-36 weeks is the BEST time to do this so that you can pass the protection on to your baby. During pregnancy is better than after pregnancy, but if you are unable to get it during pregnancy it will be offered at the hospital.   You can get this vaccine with [place], at the health department, your family doctor, or some local pharmacies  Everyone who will be around your baby should also be up-to-date on their vaccines before the baby comes. Adults (who are not pregnant) only need 1 dose of Tdap during adulthood.    Pediatricians/Family Doctors:  [facility] [phone]                  [place] [phone] (usually not accepting new patients unless you have family there already, you are always welcome to call and ask)       [facility] Department [phone]       [facility] Pediatricians/Family Doctors:   [facility]: [phone]  [facility]: [phone]  [facility]: [place] Doctors:   [facility]: [facility]: [place]:  [place]: [phone]   Home Blood Pressure Monitoring for Patients   Your provider has recommended that you check your blood pressure (BP) at least once a week at home. If you do not have a blood pressure cuff at home, one will be provided for you. Contact your provider if you have not received your monitor within 1 week.   Helpful Tips for Accurate Home Blood Pressure Checks  . Don't smoke, exercise, or drink caffeine 30 minutes before checking your BP . Use the restroom before checking your BP (a full bladder can raise your pressure) . Relax in a comfortable upright chair . Feet on the ground . Left arm resting comfortably on a flat surface at the level of your heart . Legs uncrossed . Back supported . Sit quietly and don't  talk . Place the cuff on your bare arm . Adjust snuggly, so that only two fingertips can fit between your skin and the top of the cuff . Check 2 readings separated by at least one minute . Keep a log of your BP readings . For a visual, please reference this diagram: http://ccnc.care/bpdiagram  Provider Name: [facility]     Phone: [phone]  Zone 1: ALL CLEAR  Continue to monitor your symptoms:  . BP reading is less than 140 (top number) or less than 90 (bottom number)  . No right upper stomach pain . No headaches or seeing spots . No feeling nauseated or throwing up . No swelling in face and  hands  Zone 2: CAUTION Call your doctor's office for any of the following:  . BP reading is greater than 140 (top number) or greater than 90 (bottom number)  . Stomach pain under your ribs in the middle or right side . Headaches or seeing spots . Feeling nauseated or throwing up . Swelling in face and hands  Zone 3: EMERGENCY  Seek immediate medical care if you have any of the following:  . BP reading is greater than160 (top number) or greater than 110 (bottom number) . Severe headaches not improving with Tylenol . Serious difficulty catching your breath . Any worsening symptoms from Zone 2   Third Trimester of Pregnancy The third trimester is from week 29 through week 42, months 7 through 9. The third trimester is a time when the fetus is growing rapidly. At the end of the ninth month, the fetus is about 20 inches in length and weighs 6-10 pounds.  BODY CHANGES Your body goes through many changes during pregnancy. The changes vary from woman to woman.   Your weight will continue to increase. You can expect to gain 25-35 pounds (11-16 kg) by the end of the pregnancy.  You may begin to get stretch marks on your hips, abdomen, and breasts.  You may urinate more often because the fetus is moving lower into your pelvis and pressing on your bladder.  You may develop or continue to have heartburn as a result of your pregnancy.  You may develop constipation because certain hormones are causing the muscles that push waste through your intestines to slow down.  You may develop hemorrhoids or swollen, bulging veins (varicose veins).  You may have pelvic pain because of the weight gain and pregnancy hormones relaxing your joints between the bones in your pelvis. Backaches may result from overexertion of the muscles supporting your posture.  You may have changes in your hair. These can include thickening of your hair, rapid growth, and changes in texture. Some women also have hair loss during  or after pregnancy, or hair that feels dry or thin. Your hair will most likely return to normal after your baby is born.  Your breasts will continue to grow and be tender. A yellow discharge may leak from your breasts called colostrum.  Your belly button may stick out.  You may feel short of breath because of your expanding uterus.  You may notice the fetus "dropping," or moving lower in your abdomen.  You may have a bloody mucus discharge. This usually occurs a few days to a week before labor begins.  Your cervix becomes thin and soft (effaced) near your due date. WHAT TO EXPECT AT YOUR PRENATAL EXAMS  You will have prenatal exams every 2 weeks until week 36. Then, you will have weekly prenatal  exams. During a routine prenatal visit:  You will be weighed to make sure you and the fetus are growing normally.  Your blood pressure is taken.  Your abdomen will be measured to track your baby's growth.  The fetal heartbeat will be listened to.  Any test results from the previous visit will be discussed.  You may have a cervical check near your due date to see if you have effaced. At around 36 weeks, your caregiver will check your cervix. At the same time, your caregiver will also perform a test on the secretions of the vaginal tissue. This test is to determine if a type of bacteria, Group B streptococcus, is present. Your caregiver will explain this further. Your caregiver may ask you:  What your birth plan is.  How you are feeling.  If you are feeling the baby move.  If you have had any abnormal symptoms, such as leaking fluid, bleeding, severe headaches, or abdominal cramping.  If you have any questions. Other tests or screenings that may be performed during your third trimester include:  Blood tests that check for low iron levels (anemia).  Fetal testing to check the health, activity level, and growth of the fetus. Testing is done if you have certain medical conditions or if  there are problems during the pregnancy. FALSE LABOR You may feel small, irregular contractions that eventually go away. These are called Braxton Hicks contractions, or false labor. Contractions may last for hours, days, or even weeks before true labor sets in. If contractions come at regular intervals, intensify, or become painful, it is best to be seen by your caregiver.  SIGNS OF LABOR   Menstrual-like cramps.  Contractions that are 5 minutes apart or less.  Contractions that start on the top of the uterus and spread down to the lower abdomen and back.  A sense of increased pelvic pressure or back pain.  A watery or bloody mucus discharge that comes from the vagina. If you have any of these signs before the 37th week of pregnancy, call your caregiver right away. You need to go to the hospital to get checked immediately. HOME CARE INSTRUCTIONS   Avoid all smoking, herbs, alcohol, and unprescribed drugs. These chemicals affect the formation and growth of the baby.  Follow your caregiver's instructions regarding medicine use. There are medicines that are either safe or unsafe to take during pregnancy.  Exercise only as directed by your caregiver. Experiencing uterine cramps is a good sign to stop exercising.  Continue to eat regular, healthy meals.  Wear a good support bra for breast tenderness.  Do not use hot tubs, steam rooms, or saunas.  Wear your seat belt at all times when driving.  Avoid raw meat, uncooked cheese, cat litter boxes, and soil used by cats. These carry germs that can cause birth defects in the baby.  Take your prenatal vitamins.  Try taking a stool softener (if your caregiver approves) if you develop constipation. Eat more high-fiber foods, such as fresh vegetables or fruit and whole grains. Drink plenty of fluids to keep your urine clear or pale yellow.  Take warm sitz baths to soothe any pain or discomfort caused by hemorrhoids. Use hemorrhoid cream if your  caregiver approves.  If you develop varicose veins, wear support hose. Elevate your feet for 15 minutes, 3-4 times a day. Limit salt in your diet.  Avoid heavy lifting, wear low heal shoes, and practice good posture.  Rest a lot with your legs elevated  if you have leg cramps or low back pain.  Visit your dentist if you have not gone during your pregnancy. Use a soft toothbrush to brush your teeth and be gentle when you floss.  A sexual relationship may be continued unless your caregiver directs you otherwise.  Do not travel far distances unless it is absolutely necessary and only with the approval of your caregiver.  Take prenatal classes to understand, practice, and ask questions about the labor and delivery.  Make a trial run to the hospital.  Pack your hospital bag.  Prepare the baby's nursery.  Continue to go to all your prenatal visits as directed by your caregiver. SEEK MEDICAL CARE IF:  You are unsure if you are in labor or if your water has broken.  You have dizziness.  You have mild pelvic cramps, pelvic pressure, or nagging pain in your abdominal area.  You have persistent nausea, vomiting, or diarrhea.  You have a bad smelling vaginal discharge.  You have pain with urination. SEEK IMMEDIATE MEDICAL CARE IF:   You have a fever.  You are leaking fluid from your vagina.  You have spotting or bleeding from your vagina.  You have severe abdominal cramping or pain.  You have rapid weight loss or gain.  You have shortness of breath with chest pain.  You notice sudden or extreme swelling of your face, hands, ankles, feet, or legs.  You have not felt your baby move in over an hour.  You have severe headaches that do not go away with medicine.  You have vision changes. Document Released: 02/18/2001 Document Revised: 03/01/2013 Document Reviewed: 04/27/2012 [facility] Patient Information 2015 [place], [place]. This information is not intended to replace advice  given to you by your health care provider. Make sure you discuss any questions you have with your health care provider.  PROTECT YOURSELF & YOUR BABY FROM THE FLU! Because you are pregnant, we at [facility], along with the Centers for Disease Control (CDC), recommend that you receive the flu vaccine to protect yourself and your baby from the flu. The flu is more likely to cause severe illness in pregnant women than in women of reproductive age who are not pregnant. Changes in the immune system, heart, and lungs during pregnancy make pregnant women (and women up to two weeks postpartum) more prone to severe illness from flu, including illness resulting in hospitalization. Flu also may be harmful for a pregnant woman's developing baby. A common flu symptom is fever, which may be associated with neural tube defects and other adverse outcomes for a developing baby. Getting vaccinated can also help protect a baby after birth from flu. (Mom passes antibodies onto the developing baby during her pregnancy.)  A Flu Vaccine is the Best Protection Against Flu Getting a flu vaccine is the first and most important step in protecting against flu. Pregnant women should get a flu shot and not the live attenuated influenza vaccine (LAIV), also known as nasal spray flu vaccine. Flu vaccines given during pregnancy help protect both the mother and her baby from flu. Vaccination has been shown to reduce the risk of flu-associated acute respiratory infection in pregnant women by up to one-half. A 2018 study showed that getting a flu shot reduced a pregnant woman's risk of being hospitalized with flu by an average of 40 percent. Pregnant women who get a flu vaccine are also helping to protect their babies from flu illness for the first several months after their birth, when they  are too young to get vaccinated.   A Long Record of Safety for Flu Shots in Pregnant Women Flu shots have been given to millions of pregnant women over  many years with a good safety record. There is a lot of evidence that flu vaccines can be given safely during pregnancy; though these data are limited for the first trimester. The CDC recommends that pregnant women get vaccinated during any trimester of their pregnancy. It is very important for pregnant women to get the flu shot.   Other Preventive Actions In addition to getting a flu shot, pregnant women should take the same everyday preventive actions the CDC recommends of everyone, including covering coughs, washing hands often, and avoiding people who are sick.  Symptoms and Treatment If you get sick with flu symptoms call your doctor right away. There are antiviral drugs that can treat flu illness and prevent serious flu complications. The CDC recommends prompt treatment for people who have influenza infection or suspected influenza infection and who are at high risk of serious flu complications, such as people with asthma, diabetes (including gestational diabetes), or heart disease. Early treatment of influenza in hospitalized pregnant women has been shown to reduce the length of the hospital stay.  Symptoms Flu symptoms include fever, cough, sore throat, runny or stuffy nose, body aches, headache, chills and fatigue. Some people may also have vomiting and diarrhea. People may be infected with the flu and have respiratory symptoms without a fever.  Early Treatment is Important for Pregnant Women Treatment should begin as soon as possible because antiviral drugs work best when started early (within 48 hours after symptoms start). Antiviral drugs can make your flu illness milder and make you feel better faster. They may also prevent serious health problems that can result from flu illness. Oral oseltamivir (Tamiflu) is the preferred treatment for pregnant women because it has the most studies available to suggest that it is safe and beneficial. Antiviral drugs require a prescription from your  provider. Having a fever caused by flu infection or other infections early in pregnancy may be linked to birth defects in a baby. In addition to taking antiviral drugs, pregnant women who get a fever should treat their fever with Tylenol (acetaminophen) and contact their provider immediately.  When to [place] If you are pregnant and have any of these signs, seek care immediately:  Difficulty breathing or shortness of breath  Pain or pressure in the chest or abdomen  Sudden dizziness  Confusion  Severe or persistent vomiting  High fever that is not responding to Tylenol (or store brand equivalent)  Decreased or no movement of your baby  [email or web address]

## 2019-02-16 NOTE — Progress Notes (Signed)
   LOW-RISK PREGNANCY VISIT Patient name: JELESA Andrews MRN [id number]  Date of birth: 1995/07/22 Chief Complaint:   Routine Prenatal Visit  History of Present Illness:   Anna Andrews is a 23 y.o. G53P0000 female at [redacted]w[redacted]d with an Estimated Date of Delivery: 05/09/19 being seen today for ongoing management of a low-risk pregnancy.  Today she reports no complaints. Contractions: Not present. Vag. Bleeding: None.  Movement: Present. denies leaking of fluid. Review of Systems:   Pertinent items are noted in HPI Denies abnormal vaginal discharge w/ itching/odor/irritation, headaches, visual changes, shortness of breath, chest pain, abdominal pain, severe nausea/vomiting, or problems with urination or bowel movements unless otherwise stated above. Pertinent History Reviewed:  Reviewed past medical,surgical, social, obstetrical and family history.  Reviewed problem list, medications and allergies. Physical Assessment:   Vitals:   02/16/19 1145  BP: 129/83  Pulse: 95  Weight: 226 lb (102.5 kg)  Body mass index is 34.36 kg/m.        Physical Examination:   General appearance: Well appearing, and in no distress  Mental status: Alert, oriented to person, place, and time  Skin: Warm & dry  Cardiovascular: Normal heart rate noted  Respiratory: Normal respiratory effort, no distress  Abdomen: Soft, gravid, nontender  Pelvic: Cervical exam deferred         Extremities: Edema: None  Fetal Status: Fetal Heart Rate (bpm): 140 Fundal Height: 29 cm Movement: Present    Chaperone: n/a    Results for orders placed or performed in visit on 02/16/19 (from the past 24 hour(s))  POC Urinalysis Dipstick OB   Collection Time: 02/16/19 11:48 AM  Result Value Ref Range   Color, UA     Clarity, UA     Glucose, UA Negative Negative   Bilirubin, UA     Ketones, UA neg    Spec Grav, UA     Blood, UA neg    pH, UA     POC,PROTEIN,UA Negative Negative, Trace, Small (1+), Moderate (2+), Large (3+), 4+   Urobilinogen, UA     Nitrite, UA neg    Leukocytes, UA Negative Negative   Appearance     Odor      Assessment & Plan:  1) Low-risk pregnancy G1P0000 at [redacted]w[redacted]d with an Estimated Date of Delivery: 05/09/19    Meds: No orders of the defined types were placed in this encounter.  Labs/procedures today: tdap  Plan:  Continue routine obstetrical care  Next visit: prefers online, has to pick up cuff from [place]  Reviewed: Preterm labor symptoms and general obstetric precautions including but not limited to vaginal bleeding, contractions, leaking of fluid and fetal movement were reviewed in detail with the patient.  All questions were answered.   Follow-up: Return in about 4 weeks (around 03/16/2019) for [place], [place], CNM.  Orders Placed This Encounter  Procedures  . Tdap vaccine greater than or equal to 7yo IM  . POC Urinalysis Dipstick OB   Roma Schanz CNM, [facility] 02/16/2019 12:12 PM

## 2019-03-11 NOTE — L&D Delivery Note (Addendum)
  Delivery Note Pt c/o pressure and was noted to be C/C/+2.  After a 3 contraction 2nd stage, At 10:02 PM a viable female was delivered via Vaginal, Spontaneous (Presentation: Left Occiput Anterior).  APGAR: 9, 9; weight 7 lb 0.5 oz (3189 g).  After 1 minute, the cord was clamped and cut. 40 units of pitocin diluted in 1000cc LR was infused rapidly IV. There was minimal bleeding, however, the placenta was not separated after 35 minutes. Dr. Alysia Penna called to room.  I was able to manually remove the placenta, but felt that there may have been parts still in the uterus. Dr. Alysia Penna performed a bedside US, some possible placenta vs clots were noted. A banjo was used to curretage the uterus w/a few clots removed. Repeat US showed what appears to be an empty uterus.  Blood loss was minimal and the pt tolerated very well.  Mefoxin 2gms given IV X 1.    The following complications: retained placenta >30 minutes. Anesthesia: Epidural Episiotomy: None Lacerations: 1st degree Suture Repair: 2.0 vicryl Est. Blood Loss (mL): 362  Mom to postpartum.  Baby to Couplet care / Skin to Skin.  Anna Andrews 04/28/2019, 11:19 PM

## 2019-03-16 ENCOUNTER — Ambulatory Visit (INDEPENDENT_AMBULATORY_CARE_PROVIDER_SITE_OTHER): Payer: PRIVATE HEALTH INSURANCE | Admitting: Advanced Practice Midwife

## 2019-03-16 ENCOUNTER — Other Ambulatory Visit: Payer: Self-pay

## 2019-03-16 VITALS — BP 134/83 | HR 96 | Wt 231.0 lb

## 2019-03-16 DIAGNOSIS — Z3A32 32 weeks gestation of pregnancy: Secondary | ICD-10-CM

## 2019-03-16 DIAGNOSIS — Z1389 Encounter for screening for other disorder: Secondary | ICD-10-CM

## 2019-03-16 DIAGNOSIS — Z3403 Encounter for supervision of normal first pregnancy, third trimester: Secondary | ICD-10-CM

## 2019-03-16 DIAGNOSIS — Z331 Pregnant state, incidental: Secondary | ICD-10-CM

## 2019-03-16 LAB — POCT URINALYSIS DIPSTICK OB
Blood, UA: NEGATIVE
Glucose, UA: NEGATIVE
Ketones, UA: NEGATIVE
Leukocytes, UA: NEGATIVE
Nitrite, UA: NEGATIVE
POC,PROTEIN,UA: NEGATIVE

## 2019-03-16 NOTE — Patient Instructions (Signed)
Anna Andrews, I greatly value your feedback.  If you receive a survey following your visit with [place] today, we appreciate you taking the time to fill it out.  Thanks, Philipp Deputy, CNM  [facility] HAS MOVED!!! It is now [facility] ([address] [place], [place] [address]) Entrance located off of [address] Free 24/7 valet parking    Go to [email or web address] to register for FREE online childbirth classes   Call the office [phone]) or go to [facility] if:  You begin to have strong, frequent contractions  Your water breaks.  Sometimes it is a big gush of fluid, sometimes it is just a trickle that keeps getting your panties wet or running down your legs  You have vaginal bleeding.  It is normal to have a small amount of spotting if your cervix was checked.   You don't feel your baby moving like normal.  If you don't, get you something to eat and drink and lay down and focus on feeling your baby move.  You should feel at least 10 movements in 2 hours.  If you don't, you should call the office or go to [facility].    Tdap Vaccine  It is recommended that you get the Tdap vaccine during the third trimester of EACH pregnancy to help protect your baby from getting pertussis (whooping cough)  27-36 weeks is the BEST time to do this so that you can pass the protection on to your baby. During pregnancy is better than after pregnancy, but if you are unable to get it during pregnancy it will be offered at the hospital.   You can get this vaccine with [place], at the health department, your family doctor, or some local pharmacies  Everyone who will be around your baby should also be up-to-date on their vaccines before the baby comes. Adults (who are not pregnant) only need 1 dose of Tdap during adulthood.   [place] Pediatricians/Family Doctors:  [facility] [phone]            [facility] [phone]                  [facility] [phone] (usually not accepting new patients unless you have family there already, you are always welcome to call and ask)       [facility] Department [phone]       [facility] Pediatricians/Family Doctors:   [facility]: [phone]  [facility]: [phone]  [facility]: [phone]  [facility] Doctors:   [facility]: [phone]   [facility]: [phone]  [facility] Doctors:  [facility]: [phone]   Home Blood Pressure Monitoring for Patients   Your provider has recommended that you check your blood pressure (BP) at least once a week at home. If you do not have a blood pressure cuff at home, one will be provided for you. Contact your provider if you have not received your monitor within 1 week.   Helpful Tips for Accurate Home Blood Pressure Checks  . Don't smoke, exercise, or drink caffeine 30 minutes before checking your BP . Use the restroom before checking your BP (a full bladder can raise your pressure) . Relax in a comfortable upright chair . Feet on the ground . Left arm resting comfortably on a flat surface at the level of your heart . Legs uncrossed . Back supported . Sit quietly and don't talk .  Place the cuff on your bare arm . Adjust snuggly, so that only two fingertips can fit between your skin and the top of the cuff . Check 2 readings separated by at least one minute . Keep a log of your BP readings . For a visual, please reference this diagram: http://ccnc.care/bpdiagram  Provider Name: [facility]     Phone: [phone]  Zone 1: ALL CLEAR  Continue to monitor your symptoms:  . BP reading is less than 140 (top number) or less than 90 (bottom number)  . No right upper stomach pain . No headaches or seeing spots . No feeling nauseated or throwing up . No swelling in face and  hands  Zone 2: CAUTION Call your doctor's office for any of the following:  . BP reading is greater than 140 (top number) or greater than 90 (bottom number)  . Stomach pain under your ribs in the middle or right side . Headaches or seeing spots . Feeling nauseated or throwing up . Swelling in face and hands  Zone 3: EMERGENCY  Seek immediate medical care if you have any of the following:  . BP reading is greater than160 (top number) or greater than 110 (bottom number) . Severe headaches not improving with Tylenol . Serious difficulty catching your breath . Any worsening symptoms from Zone 2   Third Trimester of Pregnancy The third trimester is from week 29 through week 42, months 7 through 9. The third trimester is a time when the fetus is growing rapidly. At the end of the ninth month, the fetus is about 20 inches in length and weighs 6-10 pounds.  BODY CHANGES Your body goes through many changes during pregnancy. The changes vary from woman to woman.   Your weight will continue to increase. You can expect to gain 25-35 pounds (11-16 kg) by the end of the pregnancy.  You may begin to get stretch marks on your hips, abdomen, and breasts.  You may urinate more often because the fetus is moving lower into your pelvis and pressing on your bladder.  You may develop or continue to have heartburn as a result of your pregnancy.  You may develop constipation because certain hormones are causing the muscles that push waste through your intestines to slow down.  You may develop hemorrhoids or swollen, bulging veins (varicose veins).  You may have pelvic pain because of the weight gain and pregnancy hormones relaxing your joints between the bones in your pelvis. Backaches may result from overexertion of the muscles supporting your posture.  You may have changes in your hair. These can include thickening of your hair, rapid growth, and changes in texture. Some women also have hair loss during  or after pregnancy, or hair that feels dry or thin. Your hair will most likely return to normal after your baby is born.  Your breasts will continue to grow and be tender. A yellow discharge may leak from your breasts called colostrum.  Your belly button may stick out.  You may feel short of breath because of your expanding uterus.  You may notice the fetus "dropping," or moving lower in your abdomen.  You may have a bloody mucus discharge. This usually occurs a few days to a week before labor begins.  Your cervix becomes thin and soft (effaced) near your due date. WHAT TO EXPECT AT YOUR PRENATAL EXAMS  You will have prenatal exams every 2 weeks until week 36. Then, you will have weekly prenatal exams. During  a routine prenatal visit:  You will be weighed to make sure you and the fetus are growing normally.  Your blood pressure is taken.  Your abdomen will be measured to track your baby's growth.  The fetal heartbeat will be listened to.  Any test results from the previous visit will be discussed.  You may have a cervical check near your due date to see if you have effaced. At around 36 weeks, your caregiver will check your cervix. At the same time, your caregiver will also perform a test on the secretions of the vaginal tissue. This test is to determine if a type of bacteria, Group B streptococcus, is present. Your caregiver will explain this further. Your caregiver may ask you:  What your birth plan is.  How you are feeling.  If you are feeling the baby move.  If you have had any abnormal symptoms, such as leaking fluid, bleeding, severe headaches, or abdominal cramping.  If you have any questions. Other tests or screenings that may be performed during your third trimester include:  Blood tests that check for low iron levels (anemia).  Fetal testing to check the health, activity level, and growth of the fetus. Testing is done if you have certain medical conditions or if  there are problems during the pregnancy. FALSE LABOR You may feel small, irregular contractions that eventually go away. These are called Braxton Hicks contractions, or false labor. Contractions may last for hours, days, or even weeks before true labor sets in. If contractions come at regular intervals, intensify, or become painful, it is best to be seen by your caregiver.  SIGNS OF LABOR   Menstrual-like cramps.  Contractions that are 5 minutes apart or less.  Contractions that start on the top of the uterus and spread down to the lower abdomen and back.  A sense of increased pelvic pressure or back pain.  A watery or bloody mucus discharge that comes from the vagina. If you have any of these signs before the 37th week of pregnancy, call your caregiver right away. You need to go to the hospital to get checked immediately. HOME CARE INSTRUCTIONS   Avoid all smoking, herbs, alcohol, and unprescribed drugs. These chemicals affect the formation and growth of the baby.  Follow your caregiver's instructions regarding medicine use. There are medicines that are either safe or unsafe to take during pregnancy.  Exercise only as directed by your caregiver. Experiencing uterine cramps is a good sign to stop exercising.  Continue to eat regular, healthy meals.  Wear a good support bra for breast tenderness.  Do not use hot tubs, steam rooms, or saunas.  Wear your seat belt at all times when driving.  Avoid raw meat, uncooked cheese, cat litter boxes, and soil used by cats. These carry germs that can cause birth defects in the baby.  Take your prenatal vitamins.  Try taking a stool softener (if your caregiver approves) if you develop constipation. Eat more high-fiber foods, such as fresh vegetables or fruit and whole grains. Drink plenty of fluids to keep your urine clear or pale yellow.  Take warm sitz baths to soothe any pain or discomfort caused by hemorrhoids. Use hemorrhoid cream if your  caregiver approves.  If you develop varicose veins, wear support hose. Elevate your feet for 15 minutes, 3-4 times a day. Limit salt in your diet.  Avoid heavy lifting, wear low heal shoes, and practice good posture.  Rest a lot with your legs elevated if you  have leg cramps or low back pain.  Visit your dentist if you have not gone during your pregnancy. Use a soft toothbrush to brush your teeth and be gentle when you floss.  A sexual relationship may be continued unless your caregiver directs you otherwise.  Do not travel far distances unless it is absolutely necessary and only with the approval of your caregiver.  Take prenatal classes to understand, practice, and ask questions about the labor and delivery.  Make a trial run to the hospital.  Pack your hospital bag.  Prepare the baby's nursery.  Continue to go to all your prenatal visits as directed by your caregiver. SEEK MEDICAL CARE IF:  You are unsure if you are in labor or if your water has broken.  You have dizziness.  You have mild pelvic cramps, pelvic pressure, or nagging pain in your abdominal area.  You have persistent nausea, vomiting, or diarrhea.  You have a bad smelling vaginal discharge.  You have pain with urination. SEEK IMMEDIATE MEDICAL CARE IF:   You have a fever.  You are leaking fluid from your vagina.  You have spotting or bleeding from your vagina.  You have severe abdominal cramping or pain.  You have rapid weight loss or gain.  You have shortness of breath with chest pain.  You notice sudden or extreme swelling of your face, hands, ankles, feet, or legs.  You have not felt your baby move in over an hour.  You have severe headaches that do not go away with medicine.  You have vision changes. Document Released: 02/18/2001 Document Revised: 03/01/2013 Document Reviewed: 04/27/2012 [facility] Patient Information 2015 [place], [place]. This information is not intended to replace advice  given to you by your health care provider. Make sure you discuss any questions you have with your health care provider.  PROTECT YOURSELF & YOUR BABY FROM THE FLU! Because you are pregnant, we at [facility], along with the Centers for Disease Control (CDC), recommend that you receive the flu vaccine to protect yourself and your baby from the flu. The flu is more likely to cause severe illness in pregnant women than in women of reproductive age who are not pregnant. Changes in the immune system, heart, and lungs during pregnancy make pregnant women (and women up to two weeks postpartum) more prone to severe illness from flu, including illness resulting in hospitalization. Flu also may be harmful for a pregnant woman's developing baby. A common flu symptom is fever, which may be associated with neural tube defects and other adverse outcomes for a developing baby. Getting vaccinated can also help protect a baby after birth from flu. (Mom passes antibodies onto the developing baby during her pregnancy.)  A Flu Vaccine is the Best Protection Against Flu Getting a flu vaccine is the first and most important step in protecting against flu. Pregnant women should get a flu shot and not the live attenuated influenza vaccine (LAIV), also known as nasal spray flu vaccine. Flu vaccines given during pregnancy help protect both the mother and her baby from flu. Vaccination has been shown to reduce the risk of flu-associated acute respiratory infection in pregnant women by up to one-half. A 2018 study showed that getting a flu shot reduced a pregnant woman's risk of being hospitalized with flu by an average of 40 percent. Pregnant women who get a flu vaccine are also helping to protect their babies from flu illness for the first several months after their birth, when they are too  young to get vaccinated.   A Long Record of Safety for Flu Shots in Pregnant Women Flu shots have been given to millions of pregnant women over  many years with a good safety record. There is a lot of evidence that flu vaccines can be given safely during pregnancy; though these data are limited for the first trimester. The CDC recommends that pregnant women get vaccinated during any trimester of their pregnancy. It is very important for pregnant women to get the flu shot.   Other Preventive Actions In addition to getting a flu shot, pregnant women should take the same everyday preventive actions the CDC recommends of everyone, including covering coughs, washing hands often, and avoiding people who are sick.  Symptoms and Treatment If you get sick with flu symptoms call your doctor right away. There are antiviral drugs that can treat flu illness and prevent serious flu complications. The CDC recommends prompt treatment for people who have influenza infection or suspected influenza infection and who are at high risk of serious flu complications, such as people with asthma, diabetes (including gestational diabetes), or heart disease. Early treatment of influenza in hospitalized pregnant women has been shown to reduce the length of the hospital stay.  Symptoms Flu symptoms include fever, cough, sore throat, runny or stuffy nose, body aches, headache, chills and fatigue. Some people may also have vomiting and diarrhea. People may be infected with the flu and have respiratory symptoms without a fever.  Early Treatment is Important for Pregnant Women Treatment should begin as soon as possible because antiviral drugs work best when started early (within 48 hours after symptoms start). Antiviral drugs can make your flu illness milder and make you feel better faster. They may also prevent serious health problems that can result from flu illness. Oral oseltamivir (Tamiflu) is the preferred treatment for pregnant women because it has the most studies available to suggest that it is safe and beneficial. Antiviral drugs require a prescription from your  provider. Having a fever caused by flu infection or other infections early in pregnancy may be linked to birth defects in a baby. In addition to taking antiviral drugs, pregnant women who get a fever should treat their fever with Tylenol (acetaminophen) and contact their provider immediately.  When to [place] If you are pregnant and have any of these signs, seek care immediately:  Difficulty breathing or shortness of breath  Pain or pressure in the chest or abdomen  Sudden dizziness  Confusion  Severe or persistent vomiting  High fever that is not responding to Tylenol (or store brand equivalent)  Decreased or no movement of your baby  [email or web address]

## 2019-03-16 NOTE — Progress Notes (Signed)
   LOW-RISK PREGNANCY VISIT Patient name: Anna Andrews MRN [id number]  Date of birth: 04/18/1995 Chief Complaint:   Routine Prenatal Visit  History of Present Illness:   Anna Andrews is a 24 y.o. G71P0000 female at [redacted]w[redacted]d with an Estimated Date of Delivery: 05/09/19 being seen today for ongoing management of a low-risk pregnancy.  Today she reports ligament stretching. Contractions: Not present. Vag. Bleeding: None.  Movement: Present. denies leaking of fluid. Review of Systems:   Pertinent items are noted in HPI Denies abnormal vaginal discharge w/ itching/odor/irritation, headaches, visual changes, shortness of breath, chest pain, abdominal pain, severe nausea/vomiting, or problems with urination or bowel movements unless otherwise stated above. Pertinent History Reviewed:  Reviewed past medical,surgical, social, obstetrical and family history.  Reviewed problem list, medications and allergies. Physical Assessment:   Vitals:   03/16/19 0939  BP: 134/83  Pulse: 96  Weight: 231 lb (104.8 kg)  Body mass index is 35.12 kg/m.        Physical Examination:   General appearance: Well appearing, and in no distress  Mental status: Alert, oriented to person, place, and time  Skin: Warm & dry  Cardiovascular: Normal heart rate noted  Respiratory: Normal respiratory effort, no distress  Abdomen: Soft, gravid, nontender  Pelvic: Cervical exam deferred         Extremities: Edema: None  Fetal Status: Fetal Heart Rate (bpm): 145 Fundal Height: 32 cm Movement: Present    Results for orders placed or performed in visit on 03/16/19 (from the past 24 hour(s))  POC Urinalysis Dipstick OB   Collection Time: 03/16/19  9:47 AM  Result Value Ref Range   Color, UA     Clarity, UA     Glucose, UA Negative Negative   Bilirubin, UA     Ketones, UA neg    Spec Grav, UA     Blood, UA neg    pH, UA     POC,PROTEIN,UA Negative Negative, Trace, Small (1+), Moderate (2+), Large (3+), 4+   Urobilinogen,  UA     Nitrite, UA neg    Leukocytes, UA Negative Negative   Appearance     Odor      Assessment & Plan:  1) Low-risk pregnancy G1P0000 at [redacted]w[redacted]d with an Estimated Date of Delivery: 05/09/19   2) Anxiety, doing well on Lexapro 5mg    Meds: No orders of the defined types were placed in this encounter.  Labs/procedures today:   Plan:  Continue routine obstetrical care   Reviewed: Preterm labor symptoms and general obstetric precautions including but not limited to vaginal bleeding, contractions, leaking of fluid and fetal movement were reviewed in detail with the patient.  All questions were answered. Has home bp cuff. Check bp weekly, let know if >140/90.   Follow-up: Return in about 2 weeks (around 03/30/2019) for LROB, MyChart video.  Orders Placed This Encounter  Procedures  . POC Urinalysis Dipstick OB   04/01/2019 [facility] 03/16/2019 10:01 AM

## 2019-03-30 ENCOUNTER — Encounter: Payer: Self-pay | Admitting: *Deleted

## 2019-03-30 ENCOUNTER — Encounter: Payer: Self-pay | Admitting: Advanced Practice Midwife

## 2019-03-30 ENCOUNTER — Other Ambulatory Visit: Payer: Self-pay

## 2019-03-30 ENCOUNTER — Telehealth (INDEPENDENT_AMBULATORY_CARE_PROVIDER_SITE_OTHER): Payer: PRIVATE HEALTH INSURANCE | Admitting: Advanced Practice Midwife

## 2019-03-30 DIAGNOSIS — Z3403 Encounter for supervision of normal first pregnancy, third trimester: Secondary | ICD-10-CM

## 2019-03-30 NOTE — Progress Notes (Signed)
   TELEHEALTH VIRTUAL OBSTETRICS VISIT ENCOUNTER NOTE Patient name: Anna Andrews MRN [id number]  Date of birth: 04/26/1995  I connected with patient on 03/30/19 at 11:10 AM EST by MyChart and verified that I am speaking with the correct person using two identifiers. Due to COVID-19 recommendations, pt is not currently in our office.    I discussed the limitations, risks, security and privacy concerns of performing an evaluation and management service by telephone and the availability of in person appointments. I also discussed with the patient that there may be a patient responsible charge related to this service. The patient expressed understanding and agreed to proceed.  Chief Complaint:   Routine Prenatal Visit  History of Present Illness:   Anna Andrews is a 24 y.o. G1P0000 female at [redacted]w[redacted]d with an Estimated Date of Delivery: 05/09/19 being evaluated today for ongoing management of a low-risk pregnancy.  Today she reports no complaints. Contractions: Irregular. Vag. Bleeding: None.  Movement: Present. denies leaking of fluid. Review of Systems:   Pertinent items are noted in HPI Denies abnormal vaginal discharge w/ itching/odor/irritation, headaches, visual changes, shortness of breath, chest pain, abdominal pain, severe nausea/vomiting, or problems with urination or bowel movements unless otherwise stated above. Pertinent History Reviewed:  Reviewed past medical,surgical, social, obstetrical and family history.  Reviewed problem list, medications and allergies. Physical Assessment:  There were no vitals filed for this visit.There is no height or weight on file to calculate BMI.        Physical Examination:   General:  Alert, oriented and cooperative.   Mental Status: Normal mood and affect perceived. Normal judgment and thought content.  Rest of physical exam deferred due to type of encounter  No results found for this or any previous visit (from the past 24 hour(s)).  Assessment &  Plan:  1) Pregnancy G1P0000 at [redacted]w[redacted]d with an Estimated Date of Delivery: 05/09/19   2) Anxiety, doing well on Lexapro 5mg    Meds: No orders of the defined types were placed in this encounter.   Labs/procedures today: none; pt was to message the office with a current BP reading today- MyChart message sent as well requesting BP reading but no response from pt  Plan:  Continue routine obstetrical care.  Has home bp cuff.  Check bp weekly, let know if >140/90.  Next visit: prefers will be in person for GBS/GC/chlam    Reviewed: Preterm labor symptoms and general obstetric precautions including but not limited to vaginal bleeding, contractions, leaking of fluid and fetal movement were reviewed in detail with the patient. The patient was advised to call back or seek an in-person office evaluation/go to [facility] at [facility] for any urgent or concerning symptoms. All questions were answered. Please refer to After Visit Summary for other counseling recommendations.    I provided 8 minutes of non-face-to-face time during this encounter.  Follow-up: No follow-ups on file.  No orders of the defined types were placed in this encounter.  [facility] CNM 03/30/2019 10:50 AM

## 2019-04-07 ENCOUNTER — Other Ambulatory Visit: Payer: PRIVATE HEALTH INSURANCE

## 2019-04-07 ENCOUNTER — Other Ambulatory Visit: Payer: Self-pay

## 2019-04-13 ENCOUNTER — Other Ambulatory Visit (HOSPITAL_COMMUNITY)
Admission: RE | Admit: 2019-04-13 | Discharge: 2019-04-13 | Disposition: A | Discharge status: Home / Self Care | Payer: PRIVATE HEALTH INSURANCE | Source: Ambulatory Visit | Attending: Family Medicine | Admitting: Family Medicine

## 2019-04-13 ENCOUNTER — Ambulatory Visit (INDEPENDENT_AMBULATORY_CARE_PROVIDER_SITE_OTHER): Payer: PRIVATE HEALTH INSURANCE | Admitting: Family Medicine

## 2019-04-13 ENCOUNTER — Encounter: Payer: Self-pay | Admitting: Family Medicine

## 2019-04-13 ENCOUNTER — Other Ambulatory Visit: Payer: Self-pay

## 2019-04-13 VITALS — BP 124/87 | HR 98 | Wt 235.0 lb

## 2019-04-13 DIAGNOSIS — Z3403 Encounter for supervision of normal first pregnancy, third trimester: Secondary | ICD-10-CM | POA: Insufficient documentation

## 2019-04-13 DIAGNOSIS — Z331 Pregnant state, incidental: Secondary | ICD-10-CM

## 2019-04-13 DIAGNOSIS — Z3A36 36 weeks gestation of pregnancy: Secondary | ICD-10-CM | POA: Diagnosis present

## 2019-04-13 DIAGNOSIS — Z1389 Encounter for screening for other disorder: Secondary | ICD-10-CM

## 2019-04-13 LAB — POCT URINALYSIS DIPSTICK OB
Blood, UA: NEGATIVE
Glucose, UA: NEGATIVE
Ketones, UA: NEGATIVE
Nitrite, UA: NEGATIVE
POC,PROTEIN,UA: NEGATIVE

## 2019-04-13 NOTE — Progress Notes (Signed)
   PRENATAL VISIT NOTE  Subjective:  Anna Andrews is a 24 y.o. G1P0000 at [redacted]w[redacted]d being seen today for ongoing prenatal care.  She is currently monitored for the following issues for this low-risk pregnancy and has Supervision of normal first pregnancy and Anxiety on their problem list.  Patient reports no complaints. Mood is stable. Contractions: Not present. Vag. Bleeding: None.  Movement: Present. Denies leaking of fluid.   The following portions of the patient's history were reviewed and updated as appropriate: allergies, current medications, past family history, past medical history, past social history, past surgical history and problem list.   Objective:   Vitals:   04/13/19 1043  BP: 124/87  Pulse: 98  Weight: 235 lb (106.6 kg)    Fetal Status: Fetal Heart Rate (bpm): 140 Fundal Height: 37 cm Movement: Present  Presentation: Vertex  General:  Alert, oriented and cooperative. Patient is in no acute distress.  Skin: Skin is warm and dry. No rash noted.   Cardiovascular: Normal heart rate noted  Respiratory: Normal respiratory effort, no problems with respiration noted  Abdomen: Soft, gravid, appropriate for gestational age.  Pain/Pressure: Present     Pelvic: Cervical exam performed Dilation: Closed Effacement (%): Thick Station: -3  Extremities: Normal range of motion.  Edema: Trace  Mental Status: Normal mood and affect. Normal behavior. Normal judgment and thought content.   Assessment and Plan:  Pregnancy: G1P0000 at [redacted]w[redacted]d 1. Encounter for supervision of normal first pregnancy in third trimester - Cervicovaginal ancillary only( [place]) - Strep Gp B NAA - Depression on Lexapro; mood stable  - RTC in 1 week in person as she does not own BP cuff   2. Screening for genitourinary condition - POC Urinalysis Dipstick OB  4. Depression - On Lexapro; stable   Preterm labor symptoms and general obstetric precautions including but not limited to vaginal bleeding,  contractions, leaking of fluid and fetal movement were reviewed in detail with the patient. Please refer to After Visit Summary for other counseling recommendations.   Return in about 1 week (around 04/20/2019) for OB; in-person.  Future Appointments  Date Time Provider Department Center  04/20/2019  9:50 AM Tilda Burrow, MD [facility]    Joselyn Arrow, MD

## 2019-04-14 LAB — CERVICOVAGINAL ANCILLARY ONLY
Chlamydia: NEGATIVE
Comment: NEGATIVE
Comment: NORMAL
Neisseria Gonorrhea: NEGATIVE

## 2019-04-15 LAB — STREP GP B NAA: Strep Gp B NAA: POSITIVE — AB

## 2019-04-20 ENCOUNTER — Ambulatory Visit (INDEPENDENT_AMBULATORY_CARE_PROVIDER_SITE_OTHER): Payer: PRIVATE HEALTH INSURANCE | Admitting: Obstetrics and Gynecology

## 2019-04-20 ENCOUNTER — Other Ambulatory Visit: Payer: Self-pay

## 2019-04-20 VITALS — BP 127/88 | HR 90 | Wt 237.4 lb

## 2019-04-20 DIAGNOSIS — Z3403 Encounter for supervision of normal first pregnancy, third trimester: Secondary | ICD-10-CM

## 2019-04-20 DIAGNOSIS — Z3A37 37 weeks gestation of pregnancy: Secondary | ICD-10-CM

## 2019-04-20 DIAGNOSIS — Z331 Pregnant state, incidental: Secondary | ICD-10-CM

## 2019-04-20 DIAGNOSIS — Z1389 Encounter for screening for other disorder: Secondary | ICD-10-CM

## 2019-04-20 LAB — POCT URINALYSIS DIPSTICK OB
Blood, UA: NEGATIVE
Glucose, UA: NEGATIVE
Ketones, UA: NEGATIVE
Nitrite, UA: NEGATIVE
POC,PROTEIN,UA: NEGATIVE

## 2019-04-20 NOTE — Progress Notes (Addendum)
Patient ID: Anna Andrews, female   DOB: 06/02/1995, 24 y.o.   MRN: [id number]    LOW-RISK PREGNANCY VISIT Patient name: Anna Andrews MRN [id number]  Date of birth: Jan 18, 1996 Chief Complaint:   Gynecologic Exam  History of Present Illness:   Anna Andrews is a 24 y.o. G28P0000 female at [redacted]w[redacted]d with an Estimated Date of Delivery: 05/09/19 being seen today for ongoing management of a low-risk pregnancy. She is GBS +, reviewed for treatment with pt.  Today she reports no complaints. Contractions: Irregular. Vag. Bleeding: None.  Movement: Present. denies leaking of fluid. Review of Systems:   Pertinent items are noted in HPI Denies abnormal vaginal discharge w/ itching/odor/irritation, headaches, visual changes, shortness of breath, chest pain, abdominal pain, severe nausea/vomiting, or problems with urination or bowel movements unless otherwise stated above. Pertinent History Reviewed:  Reviewed past medical,surgical, social, obstetrical and family history.  Reviewed problem list, medications and allergies. Physical Assessment:   Vitals:   04/20/19 1004  BP: 127/88  Pulse: 90  Weight: 237 lb 6.4 oz (107.7 kg)  Body mass index is 36.1 kg/m.        Physical Examination:   General appearance: Well appearing, and in no distress  Mental status: Alert, oriented to person, place, and time  Skin: Warm & dry  Cardiovascular: Normal heart rate noted  Respiratory: Normal respiratory effort, no distress  Abdomen: Soft, gravid, nontender  Pelvic: Cervical exam deferred         Extremities: Edema: Trace  Fetal Status: Fetal Heart Rate (bpm): 140 Fundal Height: 36 cm Movement: Present    Chaperone: Samul Dada    Results for orders placed or performed in visit on 04/20/19 (from the past 24 hour(s))  POC Urinalysis Dipstick OB   Collection Time: 04/20/19 10:03 AM  Result Value Ref Range   Color, UA     Clarity, UA     Glucose, UA Negative Negative   Bilirubin, UA     Ketones, UA n    Spec  Grav, UA     Blood, UA n    pH, UA     POC,PROTEIN,UA Negative Negative, Trace, Small (1+), Moderate (2+), Large (3+), 4+   Urobilinogen, UA     Nitrite, UA n    Leukocytes, UA Trace (A) Negative   Appearance     Odor      Assessment & Plan:  1) Low-risk pregnancy G1P0000 at [redacted]w[redacted]d with an Estimated Date of Delivery: 05/09/19   2)  GBS +, antibiotics in labor   Meds: No orders of the defined types were placed in this encounter.  Labs/procedures today: None  Plan:  Continue routine obstetrical care  Next visit: in person    Reviewed: Term labor symptoms and general obstetric precautions including but not limited to vaginal bleeding, contractions, leaking of fluid and fetal movement were reviewed in detail with the patient.  All questions were answered.   Follow-up: Return in about 1 week (around 04/27/2019).  Orders Placed This Encounter  Procedures  . POC Urinalysis Dipstick OB   By signing my name below, I, Samul Dada, attest that this documentation has been prepared under the direction and in the presence of Jonnie Kind, MD. Electronically Signed: [place]. 04/20/19. 10:14 AM. I personally performed the services described in this documentation, which was SCRIBED in my presence. The recorded information has been reviewed and considered accurate. It has been edited as necessary during review. Jonnie Kind, MD   .  04/20/2019, 3:40 PM

## 2019-04-27 ENCOUNTER — Encounter (HOSPITAL_COMMUNITY): Payer: Self-pay | Admitting: Family Medicine

## 2019-04-27 ENCOUNTER — Inpatient Hospital Stay (HOSPITAL_COMMUNITY)
Admission: AD | Admit: 2019-04-27 | Discharge: 2019-04-30 | DRG: 807 | Disposition: A | Discharge status: Home / Self Care | Payer: PRIVATE HEALTH INSURANCE | Attending: Obstetrics and Gynecology | Admitting: Obstetrics and Gynecology

## 2019-04-27 ENCOUNTER — Ambulatory Visit (INDEPENDENT_AMBULATORY_CARE_PROVIDER_SITE_OTHER): Payer: PRIVATE HEALTH INSURANCE | Admitting: Advanced Practice Midwife

## 2019-04-27 ENCOUNTER — Other Ambulatory Visit: Payer: Self-pay

## 2019-04-27 VITALS — BP 140/100 | HR 99 | Wt 238.0 lb

## 2019-04-27 DIAGNOSIS — Z331 Pregnant state, incidental: Secondary | ICD-10-CM

## 2019-04-27 DIAGNOSIS — O139 Gestational [pregnancy-induced] hypertension without significant proteinuria, unspecified trimester: Secondary | ICD-10-CM | POA: Diagnosis present

## 2019-04-27 DIAGNOSIS — Z20822 Contact with and (suspected) exposure to covid-19: Secondary | ICD-10-CM | POA: Diagnosis present

## 2019-04-27 DIAGNOSIS — F419 Anxiety disorder, unspecified: Secondary | ICD-10-CM | POA: Diagnosis present

## 2019-04-27 DIAGNOSIS — Z87891 Personal history of nicotine dependence: Secondary | ICD-10-CM | POA: Diagnosis not present

## 2019-04-27 DIAGNOSIS — O99344 Other mental disorders complicating childbirth: Secondary | ICD-10-CM | POA: Diagnosis present

## 2019-04-27 DIAGNOSIS — Z3A38 38 weeks gestation of pregnancy: Secondary | ICD-10-CM

## 2019-04-27 DIAGNOSIS — R030 Elevated blood-pressure reading, without diagnosis of hypertension: Secondary | ICD-10-CM | POA: Diagnosis not present

## 2019-04-27 DIAGNOSIS — Z3403 Encounter for supervision of normal first pregnancy, third trimester: Secondary | ICD-10-CM

## 2019-04-27 DIAGNOSIS — O134 Gestational [pregnancy-induced] hypertension without significant proteinuria, complicating childbirth: Secondary | ICD-10-CM | POA: Diagnosis not present

## 2019-04-27 DIAGNOSIS — O133 Gestational [pregnancy-induced] hypertension without significant proteinuria, third trimester: Secondary | ICD-10-CM

## 2019-04-27 DIAGNOSIS — Z3400 Encounter for supervision of normal first pregnancy, unspecified trimester: Secondary | ICD-10-CM

## 2019-04-27 DIAGNOSIS — Z1389 Encounter for screening for other disorder: Secondary | ICD-10-CM

## 2019-04-27 DIAGNOSIS — O99824 Streptococcus B carrier state complicating childbirth: Secondary | ICD-10-CM | POA: Diagnosis not present

## 2019-04-27 HISTORY — DX: Gestational (pregnancy-induced) hypertension without significant proteinuria, unspecified trimester: O13.9

## 2019-04-27 LAB — COMPREHENSIVE METABOLIC PANEL
ALT: 33 U/L (ref 0–44)
AST: 30 U/L (ref 15–41)
Albumin: 2.7 g/dL — ABNORMAL LOW (ref 3.5–5.0)
Alkaline Phosphatase: 120 U/L (ref 38–126)
Anion gap: 12 (ref 5–15)
BUN: 8 mg/dL (ref 6–20)
CO2: 19 mmol/L — ABNORMAL LOW (ref 22–32)
Calcium: 9.1 mg/dL (ref 8.9–10.3)
Chloride: 106 mmol/L (ref 98–111)
Creatinine, Ser: 0.66 mg/dL (ref 0.44–1.00)
GFR calc Af Amer: >60 mL/min (ref >60)
GFR calc non Af Amer: >60 mL/min (ref >60)
Glucose, Bld: 85 mg/dL (ref 70–99)
Potassium: 3.9 mmol/L (ref 3.5–5.1)
Sodium: 137 mmol/L (ref 135–145)
Total Bilirubin: 0.6 mg/dL (ref 0.3–1.2)
Total Protein: 6.5 g/dL (ref 6.5–8.1)

## 2019-04-27 LAB — ABO/RH: ABO/RH(D): A POS

## 2019-04-27 LAB — CBC
HCT: 35.2 % — ABNORMAL LOW (ref 36.0–46.0)
Hemoglobin: 11.8 g/dL — ABNORMAL LOW (ref 12.0–15.0)
MCH: 29.3 pg (ref 26.0–34.0)
MCHC: 33.5 g/dL (ref 30.0–36.0)
MCV: 87.3 fL (ref 80.0–100.0)
Platelets: 209 10*3/uL (ref 150–400)
RBC: 4.03 MIL/uL (ref 3.87–5.11)
RDW: 13 % (ref 11.5–15.5)
WBC: 9.5 10*3/uL (ref 4.0–10.5)
nRBC: 0.3 % — ABNORMAL HIGH (ref 0.0–0.2)

## 2019-04-27 LAB — POCT URINALYSIS DIPSTICK OB
Blood, UA: NEGATIVE
Glucose, UA: NEGATIVE
Ketones, UA: NEGATIVE
Nitrite, UA: NEGATIVE

## 2019-04-27 LAB — TYPE AND SCREEN
ABO/RH(D): A POS
Antibody Screen: NEGATIVE

## 2019-04-27 LAB — PROTEIN / CREATININE RATIO, URINE
Creatinine, Urine: 69.15 mg/dL
Protein Creatinine Ratio: 0.12 mg/mg{Cre} (ref 0.00–0.15)
Total Protein, Urine: 8 mg/dL

## 2019-04-27 LAB — SARS CORONAVIRUS 2 (TAT 6-24 HRS): SARS Coronavirus 2: NEGATIVE

## 2019-04-27 MED ORDER — MISOPROSTOL 25 MCG QUARTER TABLET
25.0000 ug | ORAL_TABLET | ORAL | Status: DC | PRN
  Administered 2019-04-27 (×2): 25 ug via VAGINAL
  Filled 2019-04-27 (×2): qty 1

## 2019-04-27 MED ORDER — LACTATED RINGERS IV SOLN: 500.0000 mL | INTRAVENOUS | Status: DC | PRN

## 2019-04-27 MED ORDER — ACETAMINOPHEN 325 MG PO TABS
650.0000 mg | ORAL_TABLET | ORAL | Status: DC | PRN
  Administered 2019-04-28: 650 mg via ORAL
  Filled 2019-04-27: qty 2

## 2019-04-27 MED ORDER — ONDANSETRON HCL 4 MG/2ML IJ SOLN: 4.0000 mg | Freq: Four times a day (QID) | INTRAMUSCULAR | Status: DC | PRN

## 2019-04-27 MED ORDER — TERBUTALINE SULFATE 1 MG/ML IJ SOLN: 0.2500 mg | Freq: Once | INTRAMUSCULAR | Status: DC | PRN

## 2019-04-27 MED ORDER — PENICILLIN G POT IN DEXTROSE 60000 UNIT/ML IV SOLN: 3.0000 10*6.[IU] | INTRAVENOUS | Status: DC

## 2019-04-27 MED ORDER — SOD CITRATE-CITRIC ACID 500-334 MG/5ML PO SOLN: 30.0000 mL | ORAL | Status: DC | PRN

## 2019-04-27 MED ORDER — SODIUM CHLORIDE 0.9 % IV SOLN: 5.0000 10*6.[IU] | Freq: Once | INTRAVENOUS | Status: DC

## 2019-04-27 MED ORDER — LIDOCAINE HCL (PF) 1 % IJ SOLN: 30.0000 mL | INTRAMUSCULAR | Status: DC | PRN

## 2019-04-27 MED ORDER — PENICILLIN G POT IN DEXTROSE 60000 UNIT/ML IV SOLN
3.0000 10*6.[IU] | INTRAVENOUS | Status: DC
  Administered 2019-04-28 (×5): 3 10*6.[IU] via INTRAVENOUS
  Filled 2019-04-27 (×5): qty 50

## 2019-04-27 MED ORDER — OXYCODONE-ACETAMINOPHEN 5-325 MG PO TABS: 2.0000 | ORAL_TABLET | ORAL | Status: DC | PRN

## 2019-04-27 MED ORDER — MISOPROSTOL 50MCG HALF TABLET
50.0000 ug | ORAL_TABLET | ORAL | Status: DC | PRN
  Administered 2019-04-27 – 2019-04-28 (×3): 50 ug via BUCCAL
  Filled 2019-04-27 (×3): qty 1

## 2019-04-27 MED ORDER — OXYCODONE-ACETAMINOPHEN 5-325 MG PO TABS: 1.0000 | ORAL_TABLET | ORAL | Status: DC | PRN

## 2019-04-27 MED ORDER — OXYTOCIN BOLUS FROM INFUSION
500.0000 mL | Freq: Once | INTRAVENOUS | Status: AC
  Administered 2019-04-28: 22:00:00 500 mL via INTRAVENOUS

## 2019-04-27 MED ORDER — LACTATED RINGERS IV SOLN: INTRAVENOUS | Status: DC

## 2019-04-27 MED ORDER — OXYTOCIN 40 UNITS IN NORMAL SALINE INFUSION - SIMPLE MED
2.5000 [IU]/h | INTRAVENOUS | Status: DC
  Filled 2019-04-27: qty 1000

## 2019-04-27 MED ORDER — SODIUM CHLORIDE 0.9 % IV SOLN
5.0000 10*6.[IU] | Freq: Once | INTRAVENOUS | Status: AC
  Administered 2019-04-28: 5 10*6.[IU] via INTRAVENOUS
  Filled 2019-04-27: qty 5

## 2019-04-27 NOTE — Progress Notes (Signed)
Patient ID: Anna Andrews, female   DOB: [birth year], 24 y.o.   MRN: [id number]  Feels slightly crampy after cytotec x 2 doses vag; denies H/A, RUQ pain or visual disturbances  BP 138/87, 143/95, 143/85 FHR 130s, +accels, no decels, Cat 1 Ctx irreg, mild Cx closed/thick/post/-3 at last exam  IUP@38 .2wks gHTN Unfavorable cx  Continue with cytotec dosing- will change to buccal Plan to place foley during the night or early AM  Anna Andrews CNM 04/27/2019 9:06 PM

## 2019-04-27 NOTE — H&P (Signed)
OBSTETRIC ADMISSION HISTORY AND PHYSICAL  Anna Andrews is a 24 y.o. female G1P0000 with IUP at [redacted]w[redacted]d by LMP/9wk [place] presenting for elevated BP. She reports +FMs, No LOF, no VB, no blurry vision, headaches or peripheral edema, and RUQ pain.  She plans on formula feeding. She request OCP for birth control. She received her prenatal care at [facility]   Dating: By LMP and 9wk US--->  Estimated Date of Delivery: 05/09/19   Prenatal History/Complications:  Past Medical History: Past Medical History:  Diagnosis Date  . Acid reflux   . Anxiety   . Eczema     Past Surgical History: Past Surgical History:  Procedure Laterality Date  . NO PAST SURGERIES      Obstetrical History: OB History    Gravida  1   Para  0   Term  0   Preterm  0   AB  0   Living  0     SAB  0   TAB  0   Ectopic  0   Multiple  0   Live Births              Social History Social History   Socioeconomic History  . Marital status: Significant Other    Spouse name: Riley Lam  . Number of children: Not on file  . Years of education: Not on file  . Highest education level: Professional school degree (e.g., MD, DDS, DVM, JD)  Occupational History  . Not on file  Tobacco Use  . Smoking status: Former Smoker    Packs/day: 0.50    Types: Cigarettes  . Smokeless tobacco: Never Used  . Tobacco comment: occ  Substance and Sexual Activity  . Alcohol use: No  . Drug use: No  . Sexual activity: Yes    Birth control/protection: Condom  Other Topics Concern  . Not on file  Social History Narrative  . Not on file   Social Determinants of Health   Financial Resource Strain: Low Risk   . Difficulty of Paying Living Expenses: Not very hard  Food Insecurity: No Food Insecurity  . Worried About Programme researcher, broadcasting/film/video in the Last Year: Never true  . Ran Out of Food in the Last Year: Never true  Transportation Needs: No Transportation Needs  . Lack of Transportation (Medical): No  . Lack of  Transportation (Non-Medical): No  Physical Activity: Sufficiently Active  . Days of Exercise per Week: 3 days  . Minutes of Exercise per Session: 60 min  Stress: No Stress Concern Present  . Feeling of Stress : Not at all  Social Connections: Not Isolated  . Frequency of Communication with Friends and Family: More than three times a week  . Frequency of Social Gatherings with Friends and Family: More than three times a week  . Attends Religious Services: 1 to 4 times per year  . Active Member of Clubs or Organizations: Yes  . Attends Banker Meetings: 1 to 4 times per year  . Marital Status: Living with partner    Family History: Family History  Problem Relation Age of Onset  . Hypertension Paternal Grandfather   . CAD Paternal Grandmother   . Hypertension Mother     Allergies: No Known Allergies  Medications Prior to Admission  Medication Sig Dispense Refill Last Dose  . escitalopram (LEXAPRO) 10 MG tablet TAKE ONE (1) TABLET BY MOUTH EVERY DAY (Patient taking differently: 5 mg. ) 30 tablet 5 04/27/2019 at 1230  .  prenatal vitamin w/FE, FA (PRENATAL 1 + 1) 27-1 MG TABS tablet Take 1 tablet by mouth daily at 12 noon. 30 tablet 12 04/27/2019 at 1230  . Blood Pressure Monitor MISC For regular home bp monitoring during pregnancy 1 each 0      Review of Systems   All systems reviewed and negative except as stated in HPI  Blood pressure (!) 143/89, pulse 83, resp. rate 18, last menstrual period 08/02/2018. General appearance: alert, cooperative and appears stated age Lungs: clear to auscultation bilaterally Heart: regular rate and rhythm Abdomen: soft, non-tender; bowel sounds normal Pelvic: adequate. Closed/thick/high Extremities: Homans sign is negative, no sign of DVT DTR's WNL Presentation: cephalic- confirmed by bedside US Fetal monitoringBaseline: 130 bpm, Variability: Good {> 6 bpm) and Accelerations: Reactive. No Decels Uterine activityNone      Prenatal labs: ABO, Rh: A/Positive/-- (08/11 1206) Antibody: Negative (11/25 0928) Rubella: 1.99 (08/11 1206) RPR: Non Reactive (11/25 0928)  HBsAg: Negative (08/11 1206)  HIV: Non Reactive (11/25 7782)  GBS: --Tessie Fass (02/03 1354)  2 hr Glucola wnl Genetic screening  Declined  Anatomy US WNL- boy  Prenatal Transfer Tool  Maternal Diabetes: No Genetic Screening: Declined Maternal Ultrasounds/Referrals: Normal Fetal Ultrasounds or other Referrals:  None Maternal Substance Abuse:  No Significant Maternal Medications:  Meds include: Other: Lexapro Significant Maternal Lab Results: Group B Strep positive  Results for orders placed or performed in visit on 04/27/19 (from the past 24 hour(s))  POC Urinalysis Dipstick OB   Collection Time: 04/27/19 10:49 AM  Result Value Ref Range   Color, UA     Clarity, UA     Glucose, UA Negative Negative   Bilirubin, UA     Ketones, UA neg    Spec Grav, UA     Blood, UA neg    pH, UA     POC,PROTEIN,UA Small (1+) Negative, Trace, Small (1+), Moderate (2+), Large (3+), 4+   Urobilinogen, UA     Nitrite, UA neg    Leukocytes, UA Large (3+) (A) Negative   Appearance     Odor      Patient Active Problem List   Diagnosis Date Noted  . Gestational hypertension 04/27/2019  . Supervision of normal first pregnancy 10/19/2018  . Anxiety 10/19/2018    Assessment/Plan:  [name] is a 24 y.o. G1P0000 at [redacted]w[redacted]d here for IOl for Gestatioanl HTN vs preeclampsia (pending lab work at this time)  #Labor: cervical ripening with cytotec. Plan for FB when appropriately. Discussed plan for FB+cytotec and then pitocin once FB out. Reviewed with patient that IOL can take 24 hr just to achieve cervical ripening. Patient encouraged to move and change positions #Pain: Epidural #FWB: Cat I #ID:  GBS pos, PCN ordered to start 12/18 00:00 #MOF: formula, counseled on normal lactogenesis  #MOC: desires oral contraceptive pills, discussed starting a 6  wk pp #Circ:  Desired - planning to have at [facility]   #Gestational HTN: mild range pressure at this time. No need for BP management and/or magnesium. Discussed with patient the threshold for starting medications and magnesium.   Caren Macadam, MD  04/27/2019, 2:44 PM

## 2019-04-27 NOTE — Progress Notes (Signed)
   LOW-RISK PREGNANCY VISIT Patient name: Anna Andrews MRN [id number]  Date of birth: May 26, 1995 Chief Complaint:   Routine Prenatal Visit  History of Present Illness:   Anna Andrews is a 24 y.o. G5P0000 female at [redacted]w[redacted]d with an Estimated Date of Delivery: 05/09/19 being seen today for ongoing management of a low-risk pregnancy.  Today she reports no complaints. Contractions: Irritability. Vag. Bleeding: None.  Movement: Present. denies leaking of fluid. Review of Systems:   Pertinent items are noted in HPI Denies abnormal vaginal discharge w/ itching/odor/irritation, headaches, visual changes, shortness of breath, chest pain, abdominal pain, severe nausea/vomiting, or problems with urination or bowel movements unless otherwise stated above. Pertinent History Reviewed:  Reviewed past medical,surgical, social, obstetrical and family history.  Reviewed problem list, medications and allergies. Physical Assessment:   Vitals:   04/27/19 1046 04/27/19 1055  BP: 137/90 (!) 140/100  Pulse: 99   Weight: 238 lb (108 kg)   Body mass index is 36.19 kg/m.        Physical Examination:   General appearance: Well appearing, and in no distress  Mental status: Alert, oriented to person, place, and time  Skin: Warm & dry  Cardiovascular: Normal heart rate noted  Respiratory: Normal respiratory effort, no distress  Abdomen: Soft, gravid, nontender  Pelvic: Cervical exam performed  Dilation: Closed Effacement (%): 50 Station: -3  Extremities: Edema: Trace  Fetal Status: Fetal Heart Rate (bpm): 138   Movement: Present Presentation: Vertex  Results for orders placed or performed in visit on 04/27/19 (from the past 24 hour(s))  POC Urinalysis Dipstick OB   Collection Time: 04/27/19 10:49 AM  Result Value Ref Range   Color, UA     Clarity, UA     Glucose, UA Negative Negative   Bilirubin, UA     Ketones, UA neg    Spec Grav, UA     Blood, UA neg    pH, UA     POC,PROTEIN,UA Small (1+) Negative,  Trace, Small (1+), Moderate (2+), Large (3+), 4+   Urobilinogen, UA     Nitrite, UA neg    Leukocytes, UA Large (3+) (A) Negative   Appearance     Odor      Assessment & Plan:  1) Low-risk pregnancy G1P0000 at [redacted]w[redacted]d with an Estimated Date of Delivery: 05/09/19   2) New onset gHTN, sending to [facility] for IOL; rev'd IOL process  3) GBS pos, plan PCN for active labor/ROM   Meds: No orders of the defined types were placed in this encounter.  Labs/procedures today: none  Plan:  IOL   Follow-up: Return for 1-2wk BP check (virtual); 4-6wk PP visit- can be virtual, pt choice.  Orders Placed This Encounter  Procedures  . POC Urinalysis Dipstick OB   Arabella Merles [place] 04/27/2019 11:24 AM

## 2019-04-28 ENCOUNTER — Encounter (HOSPITAL_COMMUNITY): Payer: Self-pay | Admitting: Family Medicine

## 2019-04-28 ENCOUNTER — Inpatient Hospital Stay (HOSPITAL_COMMUNITY): Payer: PRIVATE HEALTH INSURANCE | Admitting: Anesthesiology

## 2019-04-28 DIAGNOSIS — Z3A38 38 weeks gestation of pregnancy: Secondary | ICD-10-CM

## 2019-04-28 LAB — CBC
HCT: 36.5 % (ref 36.0–46.0)
HCT: 36.6 % (ref 36.0–46.0)
Hemoglobin: 12 g/dL (ref 12.0–15.0)
Hemoglobin: 12.3 g/dL (ref 12.0–15.0)
MCH: 29 pg (ref 26.0–34.0)
MCH: 29.7 pg (ref 26.0–34.0)
MCHC: 32.8 g/dL (ref 30.0–36.0)
MCHC: 33.7 g/dL (ref 30.0–36.0)
MCV: 88.2 fL (ref 80.0–100.0)
MCV: 88.4 fL (ref 80.0–100.0)
Platelets: 204 10*3/uL (ref 150–400)
Platelets: 209 10*3/uL (ref 150–400)
RBC: 4.14 MIL/uL (ref 3.87–5.11)
RBC: 4.14 MIL/uL (ref 3.87–5.11)
RDW: 13 % (ref 11.5–15.5)
RDW: 13.1 % (ref 11.5–15.5)
WBC: 12.9 10*3/uL — ABNORMAL HIGH (ref 4.0–10.5)
WBC: 19.1 10*3/uL — ABNORMAL HIGH (ref 4.0–10.5)
nRBC: 0.2 % (ref 0.0–0.2)
nRBC: 0.2 % (ref 0.0–0.2)

## 2019-04-28 LAB — RPR: RPR Ser Ql: NONREACTIVE

## 2019-04-28 MED ORDER — FENTANYL CITRATE (PF) 100 MCG/2ML IJ SOLN
100.0000 ug | INTRAMUSCULAR | Status: DC | PRN
  Administered 2019-04-28 (×2): 100 ug via INTRAVENOUS
  Filled 2019-04-28 (×2): qty 2

## 2019-04-28 MED ORDER — SODIUM CHLORIDE (PF) 0.9 % IJ SOLN
INTRAMUSCULAR | Status: DC | PRN
  Administered 2019-04-28: 12 mL/h via EPIDURAL

## 2019-04-28 MED ORDER — PHENYLEPHRINE 40 MCG/ML (10ML) SYRINGE FOR IV PUSH (FOR BLOOD PRESSURE SUPPORT): 80.0000 ug | PREFILLED_SYRINGE | INTRAVENOUS | Status: DC | PRN

## 2019-04-28 MED ORDER — ZOLPIDEM TARTRATE 5 MG PO TABS
5.0000 mg | ORAL_TABLET | Freq: Every evening | ORAL | Status: DC | PRN
  Administered 2019-04-28: 5 mg via ORAL
  Filled 2019-04-28: qty 1

## 2019-04-28 MED ORDER — EPHEDRINE 5 MG/ML INJ: 10.0000 mg | INTRAVENOUS | Status: DC | PRN

## 2019-04-28 MED ORDER — LIDOCAINE-EPINEPHRINE (PF) 2 %-1:200000 IJ SOLN
INTRAMUSCULAR | Status: DC | PRN
  Administered 2019-04-28 (×2): 2 mL via EPIDURAL

## 2019-04-28 MED ORDER — TERBUTALINE SULFATE 1 MG/ML IJ SOLN: 0.2500 mg | Freq: Once | INTRAMUSCULAR | Status: DC | PRN

## 2019-04-28 MED ORDER — FENTANYL-BUPIVACAINE-NACL 0.5-0.125-0.9 MG/250ML-% EP SOLN
12.0000 mL/h | EPIDURAL | Status: DC | PRN
  Filled 2019-04-28: qty 250

## 2019-04-28 MED ORDER — OXYTOCIN 40 UNITS IN NORMAL SALINE INFUSION - SIMPLE MED
1.0000 m[IU]/min | INTRAVENOUS | Status: DC
  Administered 2019-04-28: 2 m[IU]/min via INTRAVENOUS

## 2019-04-28 MED ORDER — SODIUM CHLORIDE 0.9 % IV SOLN
2.0000 g | Freq: Once | INTRAVENOUS | Status: AC
  Administered 2019-04-28: 2 g via INTRAVENOUS
  Filled 2019-04-28: qty 2

## 2019-04-28 MED ORDER — ESCITALOPRAM OXALATE 10 MG PO TABS
5.0000 mg | ORAL_TABLET | Freq: Every day | ORAL | Status: DC
  Administered 2019-04-28: 11:00:00 5 mg via ORAL
  Filled 2019-04-28: qty 0.5
  Filled 2019-04-28: qty 1

## 2019-04-28 MED ORDER — LACTATED RINGERS IV SOLN
500.0000 mL | Freq: Once | INTRAVENOUS | Status: AC
  Administered 2019-04-28: 11:00:00 500 mL via INTRAVENOUS

## 2019-04-28 MED ORDER — DIPHENHYDRAMINE HCL 50 MG/ML IJ SOLN: 12.5000 mg | INTRAMUSCULAR | Status: DC | PRN

## 2019-04-28 NOTE — Anesthesia Preprocedure Evaluation (Signed)
Anesthesia Evaluation  Patient identified by MRN, date of birth, ID band Patient awake    Reviewed: Allergy & Precautions, NPO status , Patient's Chart, lab work & pertinent test results  Airway Mallampati: II  TM Distance: >3 FB Neck ROM: Full    Dental no notable dental hx.    Pulmonary neg pulmonary ROS, former smoker,    Pulmonary exam normal breath sounds clear to auscultation       Cardiovascular hypertension (gHTN), Normal cardiovascular exam Rhythm:Regular Rate:Normal     Neuro/Psych negative neurological ROS  negative psych ROS   GI/Hepatic Neg liver ROS, GERD  ,  Endo/Other  negative endocrine ROS  Renal/GU negative Renal ROS  negative genitourinary   Musculoskeletal negative musculoskeletal ROS (+)   Abdominal   Peds  Hematology negative hematology ROS (+)   Anesthesia Other Findings   Reproductive/Obstetrics (+) Pregnancy                             Anesthesia Physical Anesthesia Plan  ASA: III  Anesthesia Plan: Epidural   Post-op Pain Management:    Induction:   PONV Risk Score and Plan: Treatment may vary due to age or medical condition  Airway Management Planned: Natural Airway  Additional Equipment:   Intra-op Plan:   Post-operative Plan:   Informed Consent: I have reviewed the patients History and Physical, chart, labs and discussed the procedure including the risks, benefits and alternatives for the proposed anesthesia with the patient or authorized representative who has indicated his/her understanding and acceptance.       Plan Discussed with: Anesthesiologist  Anesthesia Plan Comments: (Patient identified. Risks, benefits, options discussed with patient including but not limited to bleeding, infection, nerve damage, paralysis, failed block, incomplete pain control, headache, blood pressure changes, nausea, vomiting, reactions to medication, itching,  and post partum back pain. Confirmed with bedside nurse the patient's most recent platelet count. Confirmed with the patient that they are not taking any anticoagulation, have any bleeding history or any family history of bleeding disorders. Patient expressed understanding and wishes to proceed. All questions were answered. )        Anesthesia Quick Evaluation

## 2019-04-28 NOTE — Progress Notes (Signed)
Labor Progress Note Falicity L Hirth is a 24 y.o. G1P0000 at [redacted]w[redacted]d presented for IOL for gHTN. S: Feeling cramping; would like to shower.  O:  BP 129/89   Pulse 64   Temp 98.7 F (37.1 C) (Oral)   Resp 16   Ht 5\' 8"  (1.727 m)   Wt 108 kg   LMP 08/02/2018 (Approximate)   BMI 36.20 kg/m  EFM: 135, moderate variability, pos accels, no decels, reactive TOCO: irritability  CVE: Dilation: Fingertip Effacement (%): 50 Cervical Position: Posterior Station: -3 Presentation: Vertex Exam by:: [email or web address], RN   A&P: 23 y.o. G1P0000 [redacted]w[redacted]d here for IOL for gHTN. #Labor: S/p Cytotec x5. Foley balloon placed with speculum at this exam and filled with 60 mL water. Will recheck when out. Plan to start Pit at next exam assuming FB out. Anticipate SVD. #Pain: per patient request #FWB: Cat I #GBS positive; PCN #gHTN: Pr/Cr ratio 0.12. CMP WNL. BP's normal to mild range.  [redacted]w[redacted]d, MD 8:50 AM

## 2019-04-28 NOTE — Anesthesia Procedure Notes (Signed)
Epidural Patient location during procedure: OB Start time: 04/28/2019 11:50 AM End time: 04/28/2019 12:05 PM  Staffing Anesthesiologist: Elmer Picker, MD Performed: anesthesiologist   Preanesthetic Checklist Completed: patient identified, IV checked, risks and benefits discussed, monitors and equipment checked, pre-op evaluation and timeout performed  Epidural Patient position: sitting Prep: DuraPrep and site prepped and draped Patient monitoring: continuous pulse ox, blood pressure, heart rate and cardiac monitor Approach: midline Location: L3-L4 Injection technique: LOR air  Needle:  Needle type: Tuohy  Needle gauge: 17 G Needle length: 9 cm Needle insertion depth: 7 cm Catheter type: closed end flexible Catheter size: 19 Gauge Catheter at skin depth: 13 cm Test dose: negative  Assessment Sensory level: T8 Events: blood not aspirated, injection not painful, no injection resistance, no paresthesia and negative IV test  Additional Notes Patient identified. Risks/Benefits/Options discussed with patient including but not limited to bleeding, infection, nerve damage, paralysis, failed block, incomplete pain control, headache, blood pressure changes, nausea, vomiting, reactions to medication both or allergic, itching and postpartum back pain. Confirmed with bedside nurse the patient's most recent platelet count. Confirmed with patient that they are not currently taking any anticoagulation, have any bleeding history or any family history of bleeding disorders. Patient expressed understanding and wished to proceed. All questions were answered. Sterile technique was used throughout the entire procedure. Please see nursing notes for vital signs. Test dose was given through epidural catheter and negative prior to continuing to dose epidural or start infusion. Warning signs of high block given to the patient including shortness of breath, tingling/numbness in hands, complete motor block,  or any concerning symptoms with instructions to call for help. Patient was given instructions on fall risk and not to get out of bed. All questions and concerns addressed with instructions to call with any issues or inadequate analgesia.  Reason for block:procedure for pain

## 2019-04-28 NOTE — Discharge Summary (Signed)
Postpartum Discharge Summary     Patient Name: Anna Andrews DOB: Feb 26, 1996 MRN: [id number]  Date of admission: 04/27/2019 Delivering Provider: Christin Fudge   Date of discharge: 04/30/2019  Admitting diagnosis: Gestational hypertension [O13.9] Intrauterine pregnancy: [redacted]w[redacted]d    Secondary diagnosis:  Principal Problem:   Gestational hypertension Active Problems:   Supervision of normal first pregnancy   Anxiety  Additional problems: None     Discharge diagnosis: Term Pregnancy Delivered                                                                                                Post partum procedures:curettage in labor room  Augmentation: Pitocin, Cytotec and Foley Balloon  Complications: Retained placenta w/manual removal/curettage under epidural anesthesia  Hospital course:  Induction of Labor With Vaginal Delivery   24y.o. yo G1P0000 at 327w3das admitted to the hospital 04/27/2019 for induction of labor.  Indication for induction: Gestational hypertension.  Patient had an uncomplicated labor course as follows: Membrane Rupture Time/Date: 6:15 PM ,04/28/2019   Intrapartum Procedures: Episiotomy: None [1]                                         Lacerations:  1st degree [2]  Patient had delivery of a Viable infant.  Information for the patient's newborn:  BoAreolaBoy Lonni [[id number]]Delivery Method: Vaginal, Spontaneous(Filed from Delivery Summary)    04/28/2019  Details of delivery can be found in separate delivery note.  Patient had a routine postpartum course. Norvasc 2.5 mg initiated due to normal to mild-range BP's. She will f/u with BP check in clinic. Patient is discharged home 04/30/19. Delivery time: 10:02 PM   Magnesium Sulfate received: No BMZ received: No Rhophylac:No MMR:No Transfusion:No  Physical exam  Vitals:   04/29/19 1015 04/29/19 1515 04/29/19 2042 04/30/19 0509  BP: (!) 131/96 131/90 132/88 132/86  Pulse: 82 77 81 76  Resp: 18  18 18 18   Temp: 97.8 F (36.6 C) 98.6 F (37 C) 98.2 F (36.8 C) 98.4 F (36.9 C)  TempSrc: Axillary Oral Oral Oral  SpO2: 100%  100% 99%  Weight:      Height:       General: alert, cooperative and no distress Lochia: appropriate Uterine Fundus: firm Incision: N/A DVT Evaluation: No evidence of DVT seen on physical exam. No significant calf/ankle edema. Labs: Lab Results  Component Value Date   WBC 19.1 (H) 04/28/2019   HGB 12.3 04/28/2019   HCT 36.5 04/28/2019   MCV 88.2 04/28/2019   PLT 209 04/28/2019   CMP Latest Ref Rng & Units 04/27/2019  Glucose 70 - 99 mg/dL 85  BUN 6 - 20 mg/dL 8  Creatinine 0.44 - 1.00 mg/dL 0.66  Sodium 135 - 145 mmol/L 137  Potassium 3.5 - 5.1 mmol/L 3.9  Chloride 98 - 111 mmol/L 106  CO2 22 - 32 mmol/L 19(L)  Calcium 8.9 - 10.3 mg/dL 9.1  Total Protein 6.5 - 8.1 g/dL 6.5  Total  Bilirubin 0.3 - 1.2 mg/dL 0.6  Alkaline Phos 38 - 126 U/L 120  AST 15 - 41 U/L 30  ALT 0 - 44 U/L 33   Edinburgh Score: Edinburgh Postnatal Depression Scale Screening Tool 04/29/2019  I have been able to laugh and see the funny side of things. 0  I have looked forward with enjoyment to things. 0  I have blamed myself unnecessarily when things went wrong. 0  I have been anxious or worried for no good reason. 0  I have felt scared or panicky for no good reason. 0  Things have been getting on top of me. 0  I have been so unhappy that I have had difficulty sleeping. 0  I have felt sad or miserable. 0  I have been so unhappy that I have been crying. 0  The thought of harming myself has occurred to me. 0  Edinburgh Postnatal Depression Scale Total 0    Discharge instruction: per After Visit Summary and "Baby and Me Booklet".  After visit meds:  Allergies as of 04/30/2019   No Known Allergies     Medication List    TAKE these medications   acetaminophen 325 MG tablet Commonly known as: Tylenol Take 2 tablets (650 mg total) by mouth every 4 (four) hours as  needed (for pain scale < 4).   amLODipine 2.5 MG tablet Commonly known as: NORVASC Take 1 tablet (2.5 mg total) by mouth daily.   Blood Pressure Monitor Misc For regular home bp monitoring during pregnancy   calcium carbonate 500 MG chewable tablet Commonly known as: TUMS - dosed in mg elemental calcium Chew 2 tablets by mouth 2 (two) times daily as needed for indigestion or heartburn.   docusate sodium 100 MG capsule Commonly known as: COLACE Take 1 capsule (100 mg total) by mouth 2 (two) times daily.   escitalopram 10 MG tablet Commonly known as: LEXAPRO TAKE ONE (1) TABLET BY MOUTH EVERY DAY What changed: See the new instructions.   hydrocortisone cream 1 % Apply 1 application topically 2 (two) times daily as needed for itching (For eczema.).   ibuprofen 600 MG tablet Commonly known as: ADVIL Take 1 tablet (600 mg total) by mouth every 6 (six) hours.   prenatal vitamin w/FE, FA 27-1 MG Tabs tablet Take 1 tablet by mouth daily at 12 noon.       Diet: routine diet  Activity: Advance as tolerated. Pelvic rest for 6 weeks.   Outpatient follow up:1 week for BP check (virtual) Follow up Appt: Future Appointments  Date Time Provider [place]  05/05/2019  9:50 AM [facility] NURSE [facility]  06/02/2019 10:10 AM Cresenzo-Dishmon, Joaquim Lai, CNM [facility]   Follow up Visit: Follow-up Information    [place]. Schedule an appointment as soon as possible for a visit on 05/02/2019.   Contact information: [address], [address] [place] [phone]            Please schedule this patient for Postpartum visit in: 4 weeks with the following provider: Any provider Virtual For C/S patients schedule nurse incision check in weeks 2 weeks:  Low risk pregnancy complicated by: HTN Delivery mode:  SVD Anticipated Birth Control:  OCPs PP Procedures needed: BP check in 1 week Schedule Integrated Backus visit:  no     Newborn Data: Live born female  Birth Weight: 7 lb 0.5 oz (3189 g) APGAR: 9, 9  Newborn Delivery   Birth date/time:  04/28/2019 22:02:00 Delivery type: Vaginal, Spontaneous      Baby Feeding: Bottle Disposition:home with mother   04/30/2019 Chauncey Mann, MD

## 2019-04-28 NOTE — Progress Notes (Signed)
Patient ID: Anna Andrews, female   DOB: 1995-11-15, 24 y.o.   MRN: [id number]  S/p cytotec x 4 doses; feeling a little crampy  BPs 140/90, 123/69, 146/93 FHR 120s, +accels, no decels Ctx irreg, mild Cx post/FT/50%  IUP@38 .3wks gHTN Cx unfavorable  Attempt to place cervical foley with speculum- unsuccessful Will give 5th dose of cytotec (buccal)  Arabella Merles [facility] 04/28/2019 7:46 AM

## 2019-04-28 NOTE — Progress Notes (Signed)
Labor Progress Note Anna Andrews is a 24 y.o. G1P0000 at [redacted]w[redacted]d presented for IOL for gHTN. S: Comfortable with epidural.  O:  BP (!) 148/83   Pulse 84   Temp 98.6 F (37 C) (Oral)   Resp 18   Ht 5\' 8"  (1.727 m)   Wt 108 kg   LMP 08/02/2018 (Approximate)   SpO2 100%   BMI 36.20 kg/m  EFM: 125, moderate variability, no accels currently, no decels, reactive TOCO: q2-19m  CVE: Dilation: Fingertip Effacement (%): 50 Cervical Position: Posterior Station: -3 Presentation: Vertex Exam by:: [email or web address] CNM   A&P: 24 y.o. G1P0000 [redacted]w[redacted]d here for IOL for gHTN. #Labor: S/p Cytotec x5 and FB. RN to check when foley catheter placed. Will start Pitocin. AROM as indicated. Anticipate SVD. #Pain: epidural  #FWB: Cat II; post-epidural, reassuring for moderate variability  #GBS positive; PCN #gHTN: Pr/Cr ratio 0.12. CMP WNL. BP's normal to mild range.  [redacted]w[redacted]d, MD 12:28 PM

## 2019-04-28 NOTE — Progress Notes (Signed)
Labor Progress Note Anna Andrews is a 24 y.o. G1P0000 at [redacted]w[redacted]d presented for IOL for gHTN. S: Comfortable with epidural.  O:  BP 129/88   Pulse 83   Temp 98.6 F (37 C) (Oral)   Resp 16   Ht 5\' 8"  (1.727 m)   Wt 108 kg   LMP 08/02/2018 (Approximate)   SpO2 100%   BMI 36.20 kg/m  EFM: 140, moderate variability, pos accesl, no decels, reactive TOCO: q2-43m  CVE: Dilation: 5 Effacement (%): 50 Cervical Position: Posterior Station: -2 Presentation: Vertex Exam by:: [email or web address], RN    A&P: 24 y.o. G1P0000 [redacted]w[redacted]d here for IOL for gHTN. #Labor: S/p Cytotec x5 and FB. Cont Pit titration as able. AROM as indicated. Anticipate SVD. #Pain: epidural  #FWB: Cat I #GBS positive; PCN #gHTN: Pr/Cr ratio 0.12. CMP WNL. BP's normal to mild range.  [redacted]w[redacted]d, MD 5:51 PM

## 2019-04-28 NOTE — Progress Notes (Signed)
LABOR PROGRESS NOTE  Anna Andrews is a 24 y.o. G1P0000 at [redacted]w[redacted]d  admitted for IOL for gHTN. EDD 05/09/2019.  Subjective: Patient seen resting in bed, reports fetal movement, denies LOF, bleeding.   Objective: BP 123/69   Pulse 70   Temp 98.7 F (37.1 C) (Oral)   Resp 16   Ht 5\' 8"  (1.727 m)   Wt 108 kg   LMP 08/02/2018 (Approximate)   BMI 36.20 kg/m  or  Vitals:   04/28/19 0438 04/28/19 0528 04/28/19 0529 04/28/19 0609  BP: 140/84  (!) 146/93 123/69  Pulse: 74  69 70  Resp: 17  17 16   Temp:  98.7 F (37.1 C)    TempSrc:  Oral    Weight:      Height:        Dilation: Fingertip Effacement (%): 50 Cervical Position: Posterior Station: -3 Presentation: Vertex Exam by:: , RN FHT: baseline rate 125, moderate varibility, 15x15 acel, 0 decel Toco: Cytotec given x4.   Labs: Lab Results  Component Value Date   WBC 9.5 04/27/2019   HGB 11.8 (L) 04/27/2019   HCT 35.2 (L) 04/27/2019   MCV 87.3 04/27/2019   PLT 209 04/27/2019    Patient Active Problem List   Diagnosis Date Noted  . Gestational hypertension 04/27/2019  . Supervision of normal first pregnancy 10/19/2018  . Anxiety 10/19/2018    Assessment / Plan: 24 y.o. G1P0000 at [redacted]w[redacted]d here for IOL for gHTN. EDD 05/09/2019.  Labor: Early, slowly making change with Cytotec (total of 150mg  in 13 hrs). Will re-evaluate at 8am, place FB if possible. Fetal Wellbeing:  Category 1 Pain Control:  None needed yet, has Tylenol ordered Anticipated MOD:  Vaginal   [redacted]w[redacted]d, DO [facility], PGY-2 04/28/2019 6:54 AM

## 2019-04-28 NOTE — Progress Notes (Signed)
Vitals:   04/28/19 1924 04/28/19 1931  BP:  132/79  Pulse:  68  Resp:  17  Temp: 97.9 F (36.6 C)   SpO2:     Comfortable w/epidural.  Intermittent pressure. Cx 4/90/-2. IUPC placed (SROM about an hour ago).  Pitocin at 14 mu/min.  MVUs 180-200.  Continue present mgt.

## 2019-04-29 ENCOUNTER — Encounter (HOSPITAL_COMMUNITY): Payer: Self-pay | Admitting: Family Medicine

## 2019-04-29 MED ORDER — ESCITALOPRAM OXALATE 10 MG PO TABS
5.0000 mg | ORAL_TABLET | Freq: Every day | ORAL | Status: DC
  Administered 2019-04-29 – 2019-04-30 (×2): 5 mg via ORAL
  Filled 2019-04-29 (×2): qty 1

## 2019-04-29 MED ORDER — TETANUS-DIPHTH-ACELL PERTUSSIS 5-2.5-18.5 LF-MCG/0.5 IM SUSP: 0.5000 mL | Freq: Once | INTRAMUSCULAR | Status: DC

## 2019-04-29 MED ORDER — ONDANSETRON HCL 4 MG/2ML IJ SOLN: 4.0000 mg | INTRAMUSCULAR | Status: DC | PRN

## 2019-04-29 MED ORDER — DIPHENHYDRAMINE HCL 25 MG PO CAPS: 25.0000 mg | ORAL_CAPSULE | Freq: Four times a day (QID) | ORAL | Status: DC | PRN

## 2019-04-29 MED ORDER — MEASLES, MUMPS & RUBELLA VAC IJ SOLR: 0.5000 mL | Freq: Once | INTRAMUSCULAR | Status: DC

## 2019-04-29 MED ORDER — COCONUT OIL OIL: 1.0000 "application " | TOPICAL_OIL | Status: DC | PRN

## 2019-04-29 MED ORDER — FERROUS SULFATE 325 (65 FE) MG PO TABS
325.0000 mg | ORAL_TABLET | Freq: Two times a day (BID) | ORAL | Status: DC
  Administered 2019-04-29 – 2019-04-30 (×3): 325 mg via ORAL
  Filled 2019-04-29 (×3): qty 1

## 2019-04-29 MED ORDER — IBUPROFEN 600 MG PO TABS
600.0000 mg | ORAL_TABLET | Freq: Four times a day (QID) | ORAL | Status: DC
  Administered 2019-04-29 – 2019-04-30 (×5): 600 mg via ORAL
  Filled 2019-04-29 (×5): qty 1

## 2019-04-29 MED ORDER — PRENATAL MULTIVITAMIN CH
1.0000 | ORAL_TABLET | Freq: Every day | ORAL | Status: DC
  Administered 2019-04-29: 14:00:00 1 via ORAL
  Filled 2019-04-29 (×2): qty 1

## 2019-04-29 MED ORDER — WITCH HAZEL-GLYCERIN EX PADS: 1.0000 "application " | MEDICATED_PAD | CUTANEOUS | Status: DC | PRN

## 2019-04-29 MED ORDER — SIMETHICONE 80 MG PO CHEW: 80.0000 mg | CHEWABLE_TABLET | ORAL | Status: DC | PRN

## 2019-04-29 MED ORDER — METHYLERGONOVINE MALEATE 0.2 MG/ML IJ SOLN: 0.2000 mg | INTRAMUSCULAR | Status: DC | PRN

## 2019-04-29 MED ORDER — BENZOCAINE-MENTHOL 20-0.5 % EX AERO
1.0000 "application " | INHALATION_SPRAY | CUTANEOUS | Status: DC | PRN
  Administered 2019-04-29: 1 via TOPICAL
  Filled 2019-04-29: qty 56

## 2019-04-29 MED ORDER — BISACODYL 10 MG RE SUPP: 10.0000 mg | Freq: Every day | RECTAL | Status: DC | PRN

## 2019-04-29 MED ORDER — ONDANSETRON HCL 4 MG PO TABS: 4.0000 mg | ORAL_TABLET | ORAL | Status: DC | PRN

## 2019-04-29 MED ORDER — ZOLPIDEM TARTRATE 5 MG PO TABS: 5.0000 mg | ORAL_TABLET | Freq: Every evening | ORAL | Status: DC | PRN

## 2019-04-29 MED ORDER — FLEET ENEMA 7-19 GM/118ML RE ENEM: 1.0000 | ENEMA | Freq: Every day | RECTAL | Status: DC | PRN

## 2019-04-29 MED ORDER — DIBUCAINE (PERIANAL) 1 % EX OINT: 1.0000 "application " | TOPICAL_OINTMENT | CUTANEOUS | Status: DC | PRN

## 2019-04-29 MED ORDER — DOCUSATE SODIUM 100 MG PO CAPS
100.0000 mg | ORAL_CAPSULE | Freq: Two times a day (BID) | ORAL | Status: DC
  Administered 2019-04-29 – 2019-04-30 (×3): 100 mg via ORAL
  Filled 2019-04-29 (×3): qty 1

## 2019-04-29 MED ORDER — ACETAMINOPHEN 325 MG PO TABS
650.0000 mg | ORAL_TABLET | ORAL | Status: DC | PRN
  Administered 2019-04-30: 04:00:00 650 mg via ORAL
  Filled 2019-04-29: qty 2

## 2019-04-29 MED ORDER — METHYLERGONOVINE MALEATE 0.2 MG PO TABS: 0.2000 mg | ORAL_TABLET | ORAL | Status: DC | PRN

## 2019-04-29 NOTE — Progress Notes (Signed)
Post Partum Day 1 Subjective: no complaints, up ad lib, voiding and tolerating PO, small lochia, plans to bottle feed, oral contraceptives (estrogen/progesterone)  Objective: Blood pressure (!) 131/92, pulse 83, temperature 99 F (37.2 C), temperature source Axillary, resp. rate 18, height 5\' 8"  (1.727 m), weight 108 kg, last menstrual period 08/02/2018, SpO2 100 %, unknown if currently breastfeeding.  Physical Exam:  General: alert, cooperative and no distress Lochia:normal flow Chest: CTAB Heart: RRR no m/r/g Abdomen: +BS, soft, nontender,  Uterine Fundus: firm DVT Evaluation: No evidence of DVT seen on physical exam. Extremities: no edema  Recent Labs    04/28/19 1034 04/28/19 2333  HGB 12.0 12.3  HCT 36.6 36.5    Assessment/Plan: Plan for discharge tomorrow   LOS: 2 days   04/30/19 04/29/2019, 7:36 AM

## 2019-04-29 NOTE — Progress Notes (Signed)
MOB was referred for history of depression/anxiety. * Referral screened out by Clinical Social Worker because none of the following criteria appear to apply: ~ History of anxiety/depression during this pregnancy, or of post-partum depression following prior delivery. ~ Diagnosis of anxiety and/or depression within last 3 years OR * MOB's symptoms currently being treated with medication and/or therapy. Per further chart review, MOB on Lexapro for anxiety/depression.   Please contact the Clinical Social Worker if needs arise, by MOB request, or if MOB scores greater than 9/yes to question 10 on Edinburgh Postpartum Depression Screen.    Japji Kok S. Lulamae Skorupski, MSW, LCSW [facility] [phone]    

## 2019-04-29 NOTE — Anesthesia Postprocedure Evaluation (Signed)
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  Procedure(s) Performed: AN AD HOC LABOR EPIDURAL     Patient location during evaluation: Mother Baby Anesthesia Type: Epidural Level of consciousness: awake and alert Pain management: pain level controlled Vital Signs Assessment: post-procedure vital signs reviewed and stable Respiratory status: spontaneous breathing, nonlabored ventilation and respiratory function stable Cardiovascular status: stable Postop Assessment: no headache, no backache and epidural receding Anesthetic complications: no    Last Vitals:  Vitals:   04/29/19 0237 04/29/19 0617  BP: 135/85 (!) 131/92  Pulse: 91 83  Resp: 16 18  Temp: 37.1 C 37.2 C  SpO2: 99% 100%    Last Pain:  Vitals:   04/29/19 0617  TempSrc: Axillary  PainSc:    Pain Goal:                   Anna Andrews

## 2019-04-30 MED ORDER — IBUPROFEN 600 MG PO TABS: 600.0000 mg | ORAL_TABLET | Freq: Four times a day (QID) | ORAL | 0 refills | Status: DC

## 2019-04-30 MED ORDER — ACETAMINOPHEN 325 MG PO TABS: 650.0000 mg | ORAL_TABLET | ORAL | 0 refills | Status: DC | PRN

## 2019-04-30 MED ORDER — DOCUSATE SODIUM 100 MG PO CAPS: 100.0000 mg | ORAL_CAPSULE | Freq: Two times a day (BID) | ORAL | 0 refills | Status: DC

## 2019-04-30 MED ORDER — AMLODIPINE BESYLATE 5 MG PO TABS
2.5000 mg | ORAL_TABLET | Freq: Every day | ORAL | Status: DC
  Administered 2019-04-30: 10:00:00 2.5 mg via ORAL
  Filled 2019-04-30: qty 1

## 2019-04-30 MED ORDER — AMLODIPINE BESYLATE 2.5 MG PO TABS: 2.5000 mg | ORAL_TABLET | Freq: Every day | ORAL | 0 refills | Status: DC

## 2019-04-30 NOTE — Progress Notes (Signed)
POSTPARTUM PROGRESS NOTE  Subjective: Taniesha L Balgobin is a 24 y.o. G1P1001 s/p SVD, IOL at [redacted]w[redacted]d.  She reports she doing well. No acute events overnight. She denies any problems with ambulating, voiding or po intake. Denies nausea or vomiting. Pain is well controlled.   Objective: Blood pressure 132/86, pulse 76, temperature 98.4 F (36.9 C), temperature source Oral, resp. rate 18, height 5\' 8"  (1.727 m), weight 108 kg, last menstrual period 08/02/2018, SpO2 99 %, unknown if currently breastfeeding.  Physical Exam:  General: alert, cooperative and no distress Chest: RRR, no respiratory distress Abdomen: soft, non-tender  Uterine Fundus: firm, appropriately tender Extremities: No calf swelling or tenderness  Recent Labs    04/28/19 1034 04/28/19 2333  HGB 12.0 12.3  HCT 36.6 36.5    Assessment/Plan: Sarina L Guillot is a 24 y.o. G1P1001 s/p SVD at [redacted]w[redacted]d for IOL for gHTN.  Routine Postpartum Care: Doing well, pain well-controlled.  -- Continue routine care, lactation support  -- Contraception: Plans to follow up with her OB -- Feeding: Bottle  Dispo: Plan for discharge today  [redacted]w[redacted]d, DO Resident, [facility] for [facility]   I saw and evaluated the patient. I agree with the findings and the plan of care as documented in the resident's note. Norvasc 2.5 mg initiated. Will DC home.   [facility], MD [facility] Fellow, [facility] for [facility]

## 2019-05-05 ENCOUNTER — Encounter: Payer: Self-pay | Admitting: Advanced Practice Midwife

## 2019-05-05 ENCOUNTER — Other Ambulatory Visit: Payer: Self-pay

## 2019-05-05 ENCOUNTER — Telehealth (INDEPENDENT_AMBULATORY_CARE_PROVIDER_SITE_OTHER): Payer: PRIVATE HEALTH INSURANCE | Admitting: *Deleted

## 2019-05-05 VITALS — BP 152/100 | HR 84

## 2019-05-05 DIAGNOSIS — O139 Gestational [pregnancy-induced] hypertension without significant proteinuria, unspecified trimester: Secondary | ICD-10-CM

## 2019-05-05 MED ORDER — ENALAPRIL MALEATE 5 MG PO TABS: 5.0000 mg | ORAL_TABLET | Freq: Every day | ORAL | 2 refills | Status: DC

## 2019-05-05 MED ORDER — ESCITALOPRAM OXALATE 10 MG PO TABS: ORAL_TABLET | ORAL | 5 refills | Status: DC

## 2019-05-05 MED ORDER — HYDROXYZINE PAMOATE 25 MG PO CAPS: 25.0000 mg | ORAL_CAPSULE | Freq: Three times a day (TID) | ORAL | 0 refills | Status: DC | PRN

## 2019-05-05 NOTE — Progress Notes (Addendum)
   NURSE VISIT- BLOOD PRESSURE CHECK  SUBJECTIVE:  Anna Andrews is a 24 y.o. G71P1001 female here for BP check. She is postpartum, delivery date 04/28/19    HYPERTENSION ROS:  Pregnant/postpartum:  . Severe headaches that don't go away with tylenol/other medicines: No  . Visual changes (seeing spots/double/blurred vision) No  . Severe pain under right breast breast or in center of upper chest No  . Severe nausea/vomiting No  . Taking medicines as instructed no   OBJECTIVE:  BP (!) 152/100 (BP Location: Right Wrist, Patient Position: Sitting, Cuff Size: Normal)   Pulse 84   Appearance alert, well appearing, and in no distress.  ASSESSMENT: Postpartum  blood pressure check  PLAN: Discussed with Cathie Beams, CNM   Recommendations:    Follow-up: as scheduled   Jadon Ressler, Faith Rogue  05/05/2019 9:51 AM  Not taking norvasc d/t feeling cold all over. Not breastfeeding.  Will rx vasotec 5mg  daily and f/t ASAP if >160/110, otherwise on Monday. Also, rx vistaril prn anxiety and increase lexapro back to 10mg  dailyl

## 2019-05-05 NOTE — Addendum Note (Signed)
Addended by: Jacklyn Shell on: 05/05/2019 10:04 AM   Modules accepted: Orders

## 2019-05-09 ENCOUNTER — Other Ambulatory Visit: Payer: Self-pay

## 2019-05-09 ENCOUNTER — Telehealth (INDEPENDENT_AMBULATORY_CARE_PROVIDER_SITE_OTHER): Payer: PRIVATE HEALTH INSURANCE

## 2019-05-09 VITALS — BP 127/80 | HR 85

## 2019-05-09 DIAGNOSIS — Z0130 Encounter for examination of blood pressure without abnormal findings: Secondary | ICD-10-CM

## 2019-05-09 NOTE — Progress Notes (Addendum)
   NURSE VISIT- BLOOD PRESSURE CHECK  SUBJECTIVE:  Anna Andrews is a 24 y.o. G65P1001 female here for BP check  OBJECTIVE:  BP 127/80 (BP Location: Right Arm, Patient Position: Sitting, Cuff Size: Normal)   Pulse 85   Breastfeeding No   ASSESSMENT: GYN  blood pressure check  PLAN: Discussed with Joellyn Haff, CNM, WHNP   Recommendations: no changes needed   Follow-up: in 4 weeks  Keep taking Vastotec and stop 2 days before pp visit. Will route kimberly booker Rennis Petty  05/09/2019 3:15 PM   Chart reviewed for nurse visit. Agree with plan of care.  Cheral Marker, [place] 05/10/2019 2:35 PM

## 2019-05-13 ENCOUNTER — Telehealth: Payer: PRIVATE HEALTH INSURANCE | Admitting: *Deleted

## 2019-05-13 ENCOUNTER — Other Ambulatory Visit: Payer: Self-pay

## 2019-05-13 ENCOUNTER — Telehealth: Payer: PRIVATE HEALTH INSURANCE

## 2019-05-18 ENCOUNTER — Encounter: Payer: Self-pay | Admitting: Advanced Practice Midwife

## 2019-06-02 ENCOUNTER — Other Ambulatory Visit (HOSPITAL_COMMUNITY)
Admission: RE | Admit: 2019-06-02 | Discharge: 2019-06-02 | Disposition: A | Discharge status: Home / Self Care | Payer: PRIVATE HEALTH INSURANCE | Source: Ambulatory Visit | Attending: Advanced Practice Midwife | Admitting: Advanced Practice Midwife

## 2019-06-02 ENCOUNTER — Other Ambulatory Visit: Payer: Self-pay

## 2019-06-02 ENCOUNTER — Encounter: Payer: Self-pay | Admitting: Advanced Practice Midwife

## 2019-06-02 ENCOUNTER — Ambulatory Visit (INDEPENDENT_AMBULATORY_CARE_PROVIDER_SITE_OTHER): Payer: PRIVATE HEALTH INSURANCE | Admitting: Advanced Practice Midwife

## 2019-06-02 DIAGNOSIS — Z01419 Encounter for gynecological examination (general) (routine) without abnormal findings: Secondary | ICD-10-CM

## 2019-06-02 DIAGNOSIS — Z124 Encounter for screening for malignant neoplasm of cervix: Secondary | ICD-10-CM

## 2019-06-02 MED ORDER — NORETHIN-ETH ESTRAD-FE BIPHAS 1 MG-10 MCG / 10 MCG PO TABS: 1.0000 | ORAL_TABLET | Freq: Every day | ORAL | 11 refills | Status: DC

## 2019-06-02 MED ORDER — NORETHIN ACE-ETH ESTRAD-FE 1-20 MG-MCG(24) PO TABS: 1.0000 | ORAL_TABLET | Freq: Every day | ORAL | 11 refills | Status: DC

## 2019-06-02 NOTE — Patient Instructions (Signed)

## 2019-06-02 NOTE — Progress Notes (Signed)
Anna Andrews is a 24 y.o. who presents for a postpartum visit. She is 6 weeks postpartum following a spontaneous vaginal delivery. I have fully reviewed the prenatal and intrapartum course. The delivery was at 38.3 gestational weeks.  Anesthesia: epidural. She had IOL for GHTN, sent home on vasotec .Last dose 2 days ago.  Postpartum course has been uneventful. Baby's course has been uneventful. Baby is feeding by bottle. Bleeding: at end of period. . Bowel function is normal. Bladder function is normal. Patient is not sexually active. Contraception method is abstinence. Postpartum depression screening: negative. Last pap normal, almost 3 years ago. Requests pap today.   Current Outpatient Medications:  .  acetaminophen (TYLENOL) 325 MG tablet, Take 2 tablets (650 mg total) by mouth every 4 (four) hours as needed (for pain scale < 4)., Disp: 30 tablet, Rfl: 0 .  Blood Pressure Monitor MISC, For regular home bp monitoring during pregnancy, Disp: 1 each, Rfl: 0 .  calcium carbonate (TUMS - DOSED IN MG ELEMENTAL CALCIUM) 500 MG chewable tablet, Chew 2 tablets by mouth 2 (two) times daily as needed for indigestion or heartburn., Disp: , Rfl:  .  escitalopram (LEXAPRO) 10 MG tablet, TAKE ONE (1) TABLET BY MOUTH EVERY DAY, Disp: 30 tablet, Rfl: 5 .  hydrocortisone cream 1 %, Apply 1 application topically 2 (two) times daily as needed for itching (For eczema.)., Disp: , Rfl:  .  prenatal vitamin w/FE, FA (PRENATAL 1 + 1) 27-1 MG TABS tablet, Take 1 tablet by mouth daily at 12 noon., Disp: 30 tablet, Rfl: 12 .  docusate sodium (COLACE) 100 MG capsule, Take 1 capsule (100 mg total) by mouth 2 (two) times daily. (Patient not taking: Reported on 05/09/2019), Disp: 10 capsule, Rfl: 0 .  enalapril (VASOTEC) 5 MG tablet, Take 1 tablet (5 mg total) by mouth daily. (Patient not taking: Reported on 06/02/2019), Disp: 30 tablet, Rfl: 2 .  hydrOXYzine (VISTARIL) 25 MG capsule, Take 1 capsule (25 mg total) by mouth 3 (three)  times daily as needed. (Patient not taking: Reported on 05/09/2019), Disp: 30 capsule, Rfl: 0  Review of Systems   Constitutional: Negative for fever and chills Eyes: Negative for visual disturbances Respiratory: Negative for shortness of breath, dyspnea Cardiovascular: Negative for chest pain or palpitations  Gastrointestinal: Negative for vomiting, diarrhea and constipation Genitourinary: Negative for dysuria and urgency Musculoskeletal: Negative for back pain, joint pain, myalgias  Neurological: Negative for dizziness and headaches    Objective:     Vitals:   06/02/19 1021  BP: 128/82  Pulse: 85   General:  alert, cooperative and no distress   Breasts:  negative  Lungs: Normal respiratory effort  Heart:  regular rate and rhythm  Abdomen: Soft, nontender   Vulva:  normal  Vagina: normal vagina  Cervix:  closed  Corpus: Well involuted     Rectal Exam: no hemorrhoids        Assessment:    Normal postpartum exam. GHTN: resolved. DC meds Pap smear screening Plan:   1. Contraception: abstinence 2. Follow up in:   or as needed.

## 2019-06-06 LAB — CYTOLOGY - PAP
Comment: NEGATIVE
Diagnosis: NEGATIVE
High risk HPV: NEGATIVE

## 2019-07-04 ENCOUNTER — Other Ambulatory Visit: Payer: Self-pay | Admitting: Women's Health

## 2019-07-04 MED ORDER — NORETHIN ACE-ETH ESTRAD-FE 1-20 MG-MCG PO TABS: 1.0000 | ORAL_TABLET | Freq: Every day | ORAL | 11 refills | Status: DC

## 2019-09-08 DIAGNOSIS — Z419 Encounter for procedure for purposes other than remedying health state, unspecified: Secondary | ICD-10-CM | POA: Diagnosis not present

## 2019-10-09 DIAGNOSIS — Z419 Encounter for procedure for purposes other than remedying health state, unspecified: Secondary | ICD-10-CM | POA: Diagnosis not present

## 2019-11-02 ENCOUNTER — Ambulatory Visit
Admission: EM | Admit: 2019-11-02 | Discharge: 2019-11-02 | Disposition: A | Discharge status: Home / Self Care | Payer: 59

## 2019-11-02 ENCOUNTER — Emergency Department (HOSPITAL_COMMUNITY)
Admission: EM | Admit: 2019-11-02 | Discharge: 2019-11-02 | Disposition: A | Discharge status: Home / Self Care | Payer: 59 | Attending: Emergency Medicine | Admitting: Emergency Medicine

## 2019-11-02 ENCOUNTER — Other Ambulatory Visit: Payer: Self-pay

## 2019-11-02 ENCOUNTER — Encounter (HOSPITAL_COMMUNITY): Payer: Self-pay | Admitting: *Deleted

## 2019-11-02 ENCOUNTER — Emergency Department (HOSPITAL_COMMUNITY): Payer: 59

## 2019-11-02 DIAGNOSIS — R1011 Right upper quadrant pain: Secondary | ICD-10-CM

## 2019-11-02 DIAGNOSIS — Z87891 Personal history of nicotine dependence: Secondary | ICD-10-CM | POA: Insufficient documentation

## 2019-11-02 DIAGNOSIS — M549 Dorsalgia, unspecified: Secondary | ICD-10-CM | POA: Diagnosis not present

## 2019-11-02 DIAGNOSIS — K219 Gastro-esophageal reflux disease without esophagitis: Secondary | ICD-10-CM | POA: Diagnosis not present

## 2019-11-02 DIAGNOSIS — Z79899 Other long term (current) drug therapy: Secondary | ICD-10-CM | POA: Insufficient documentation

## 2019-11-02 LAB — CBC
HCT: 39 % (ref 36.0–46.0)
Hemoglobin: 12.2 g/dL (ref 12.0–15.0)
MCH: 25.8 pg — ABNORMAL LOW (ref 26.0–34.0)
MCHC: 31.3 g/dL (ref 30.0–36.0)
MCV: 82.6 fL (ref 80.0–100.0)
Platelets: 403 10*3/uL — ABNORMAL HIGH (ref 150–400)
RBC: 4.72 MIL/uL (ref 3.87–5.11)
RDW: 14.8 % (ref 11.5–15.5)
WBC: 10.8 10*3/uL — ABNORMAL HIGH (ref 4.0–10.5)
nRBC: 0 % (ref 0.0–0.2)

## 2019-11-02 LAB — COMPREHENSIVE METABOLIC PANEL
ALT: 16 U/L (ref 0–44)
AST: 14 U/L — ABNORMAL LOW (ref 15–41)
Albumin: 4.4 g/dL (ref 3.5–5.0)
Alkaline Phosphatase: 58 U/L (ref 38–126)
Anion gap: 11 (ref 5–15)
BUN: 12 mg/dL (ref 6–20)
CO2: 25 mmol/L (ref 22–32)
Calcium: 9.3 mg/dL (ref 8.9–10.3)
Chloride: 102 mmol/L (ref 98–111)
Creatinine, Ser: 0.74 mg/dL (ref 0.44–1.00)
GFR calc Af Amer: >60 mL/min (ref >60)
GFR calc non Af Amer: >60 mL/min (ref >60)
Glucose, Bld: 90 mg/dL (ref 70–99)
Potassium: 4 mmol/L (ref 3.5–5.1)
Sodium: 138 mmol/L (ref 135–145)
Total Bilirubin: 0.6 mg/dL (ref 0.3–1.2)
Total Protein: 8.3 g/dL — ABNORMAL HIGH (ref 6.5–8.1)

## 2019-11-02 LAB — URINALYSIS, ROUTINE W REFLEX MICROSCOPIC
Bilirubin Urine: NEGATIVE
Glucose, UA: NEGATIVE mg/dL
Hgb urine dipstick: NEGATIVE
Ketones, ur: NEGATIVE mg/dL
Nitrite: NEGATIVE
Protein, ur: NEGATIVE mg/dL
Specific Gravity, Urine: 1.018 (ref 1.005–1.030)
pH: 5 (ref 5.0–8.0)

## 2019-11-02 LAB — LIPASE, BLOOD: Lipase: 21 U/L (ref 11–51)

## 2019-11-02 LAB — POC URINE PREG, ED: Preg Test, Ur: NEGATIVE

## 2019-11-02 MED ORDER — LANSOPRAZOLE 30 MG PO CPDR: 30.0000 mg | DELAYED_RELEASE_CAPSULE | Freq: Every day | ORAL | 0 refills | Status: DC

## 2019-11-02 NOTE — ED Provider Notes (Signed)
[facility] EMERGENCY DEPARTMENT Provider Note   CSN: [id number] Arrival date & time: 11/02/19  1212     History Chief Complaint  Patient presents with  . Abdominal Pain    Anna Andrews is a 24 y.o. female with a history of acid reflux  6 months postpartum (not breast feeding)  presenting with right upper quadrant abdominal pain which started approximately 2 weeks ago.  She describes a constant dull pressure-like sensation which radiates into her back between her shoulder blades.  She has increased symptoms after meals, not particularly fatty meals however and has found relief by applying pressure to the site.  Additionally belching improves her symptoms.  In the past she has been on Prevacid which was helpful, but she was recommended to stop this medication and seek GI evaluation.  She has not done this yet but states her symptoms have worsened since stopping this medication.  She denies fevers or chills, no nausea or vomiting, she has had several episodes of diarrhea will stools, nonbloody.  She denies shortness of breath or cough but experiences pressure across her chest and localizing to her mid back with deep inspiration.  The history is provided by the patient.       Past Medical History:  Diagnosis Date  . Acid reflux   . Anxiety   . Eczema     Patient Active Problem List   Diagnosis Date Noted  . Gestational hypertension 04/27/2019  . Supervision of normal first pregnancy 10/19/2018  . Anxiety 10/19/2018    Past Surgical History:  Procedure Laterality Date  . NO PAST SURGERIES       OB History    Gravida  1   Para  1   Term  1   Preterm  0   AB  0   Living  1     SAB  0   TAB  0   Ectopic  0   Multiple  0   Live Births  1           Family History  Problem Relation Age of Onset  . Hypertension Paternal Grandfather   . CAD Paternal Grandmother   . Hypertension Mother     Social History   Tobacco Use  . Smoking status: Former Smoker     Packs/day: 0.50    Types: Cigarettes  . Smokeless tobacco: Never Used  . Tobacco comment: occ  Vaping Use  . Vaping Use: Never used  Substance Use Topics  . Alcohol use: No  . Drug use: No    Home Medications Prior to Admission medications   Medication Sig Start Date End Date Taking? Authorizing Provider  acetaminophen (TYLENOL) 325 MG tablet Take 2 tablets (650 mg total) by mouth every 4 (four) hours as needed (for pain scale < 4). 04/30/19   Fair, Hoyle Sauer, MD  Blood Pressure Monitor MISC For regular home bp monitoring during pregnancy 10/19/18   Cheral Marker, CNM  calcium carbonate (TUMS - DOSED IN MG ELEMENTAL CALCIUM) 500 MG chewable tablet Chew 2 tablets by mouth 2 (two) times daily as needed for indigestion or heartburn.    [provider]  escitalopram (LEXAPRO) 10 MG tablet TAKE ONE (1) TABLET BY MOUTH EVERY DAY 05/05/19   Cresenzo-Dishmon, Scarlette Calico, CNM  hydrocortisone cream 1 % Apply 1 application topically 2 (two) times daily as needed for itching (For eczema.).    [provider]  lansoprazole (PREVACID) 30 MG capsule Take 1 capsule (30 mg  total) by mouth daily at 12 noon. 11/02/19   Burgess Amor, PA-C  norethindrone-ethinyl estradiol (JUNEL FE 1/20) 1-20 MG-MCG tablet Take 1 tablet by mouth daily. 07/04/19   Cheral Marker, CNM  Norethindrone-Ethinyl Estradiol-Fe Biphas (LO LOESTRIN FE) 1 MG-10 MCG / 10 MCG tablet Take 1 tablet by mouth daily. 06/02/19   Cresenzo-Dishmon, Scarlette Calico, CNM  prenatal vitamin w/FE, FA (PRENATAL 1 + 1) 27-1 MG TABS tablet Take 1 tablet by mouth daily at 12 noon. 09/13/18   Adline Potter, NP  enalapril (VASOTEC) 5 MG tablet Take 1 tablet (5 mg total) by mouth daily. Patient not taking: Reported on 06/02/2019 05/05/19 11/02/19  Jacklyn Shell, CNM    Allergies    Patient has no known allergies.  Review of Systems   Review of Systems  Constitutional: Negative for chills and fever.  HENT: Negative for congestion  and sore throat.   Eyes: Negative.   Respiratory: Positive for chest tightness. Negative for shortness of breath.   Cardiovascular: Negative for chest pain.  Gastrointestinal: Positive for abdominal pain and diarrhea. Negative for abdominal distention, constipation, nausea and vomiting.       Negative except as mentioned in HPI.   Genitourinary: Negative.   Musculoskeletal: Positive for back pain. Negative for arthralgias, joint swelling and neck pain.  Skin: Negative.  Negative for rash and wound.  Neurological: Negative for dizziness, weakness, light-headedness, numbness and headaches.  Psychiatric/Behavioral: Negative.     Physical Exam Updated Vital Signs BP 132/88 (BP Location: Right Arm)   Pulse 85   Temp 98.3 F (36.8 C) (Oral)   Resp 20   Ht 5\' 9"  (1.753 m)   Wt 105.2 kg   LMP 10/13/2019   SpO2 100%   BMI 34.26 kg/m   Physical Exam Vitals and nursing note reviewed.  Constitutional:      Appearance: She is well-developed.  HENT:     Head: Normocephalic and atraumatic.  Eyes:     Conjunctiva/sclera: Conjunctivae normal.  Cardiovascular:     Rate and Rhythm: Normal rate and regular rhythm.     Heart sounds: Normal heart sounds.  Pulmonary:     Effort: Pulmonary effort is normal.     Breath sounds: Normal breath sounds. No wheezing or rhonchi.  Chest:     Chest wall: No tenderness.  Abdominal:     General: Bowel sounds are normal.     Palpations: Abdomen is soft.     Tenderness: There is no abdominal tenderness. There is no guarding or rebound. Negative signs include Murphy's sign.  Musculoskeletal:        General: No swelling or tenderness. Normal range of motion.     Cervical back: Normal range of motion.     Right lower leg: No edema.     Left lower leg: No edema.  Skin:    General: Skin is warm and dry.  Neurological:     Mental Status: She is alert.     ED Results / Procedures / Treatments   Labs (all labs ordered are listed, but only abnormal  results are displayed) Labs Reviewed  COMPREHENSIVE METABOLIC PANEL - Abnormal; Notable for the following components:      Result Value   Total Protein 8.3 (*)    AST 14 (*)    All other components within normal limits  CBC - Abnormal; Notable for the following components:   WBC 10.8 (*)    MCH 25.8 (*)    Platelets 403 (*)  All other components within normal limits  URINALYSIS, ROUTINE W REFLEX MICROSCOPIC - Abnormal; Notable for the following components:   APPearance HAZY (*)    Leukocytes,Ua TRACE (*)    Bacteria, UA RARE (*)    All other components within normal limits  LIPASE, BLOOD  POC URINE PREG, ED    EKG None  Radiology DG Chest 2 View  Result Date: 11/02/2019 CLINICAL DATA:  Abdominal pain, pleuritic back pain EXAM: CHEST - 2 VIEW COMPARISON:  None. FINDINGS: The heart size and mediastinal contours are within normal limits. Both lungs are clear. The visualized skeletal structures are unremarkable. IMPRESSION: Negative Electronically Signed   By: Charlett Nose M.D.   On: 11/02/2019 17:10   US Abdomen Limited RUQ  Result Date: 11/02/2019 CLINICAL DATA:  Right upper quadrant abdominal pain over the last week. EXAM: ULTRASOUND ABDOMEN LIMITED RIGHT UPPER QUADRANT COMPARISON:  None. FINDINGS: Gallbladder: No gallstones or wall thickening visualized. No sonographic Murphy sign noted by sonographer. Common bile duct: Diameter: 2.9 mm common normal. Liver: No focal lesion identified. Within normal limits in parenchymal echogenicity. Portal vein is patent on color Doppler imaging with normal direction of blood flow towards the liver. Other: None. IMPRESSION: Normal right upper quadrant ultrasound. No abnormality seen to explain the presenting symptoms. Electronically Signed   By: Paulina Fusi M.D.   On: 11/02/2019 15:58    Procedures Procedures (including critical care time)  Medications Ordered in ED Medications - No data to display  ED Course  I have reviewed the triage  vital signs and the nursing notes.  Pertinent labs & imaging results that were available during my care of the patient were reviewed by me and considered in my medical decision making (see chart for details).    MDM Rules/Calculators/A&P                          Exam and history suggesting acid reflux disease, given symptoms were improved while on a PPI and now worsened.  However with the right upper quadrant pain that radiates into her mid back, this could also be gallbladder dysfunction.  Her ultrasound today is negative for stones or acute cholecystitis but she may benefit from an outpatient HIDA scan.  Also may need to consider upper endoscopy.  Chest x-ray is clear.  Vital signs are normal, doubt PE.  No complaints of shortness of breath.  She was placed back on a PPI since this medication has been helpful, however she was strongly encouraged to follow-up with gastroenterology for further evaluation of her symptoms.  She was referred to Dr. Karilyn Cota for this.   In the interim, advised to avoid intake of fatty foods in the event this is truly a gallbladder issue. Final Clinical Impression(s) / ED Diagnoses Final diagnoses:  RUQ abdominal pain  Right upper quadrant abdominal pain  Gastroesophageal reflux disease, unspecified whether esophagitis present    Rx / DC Orders ED Discharge Orders         Ordered    lansoprazole (PREVACID) 30 MG capsule  Daily        11/02/19 1715           Burgess Amor, PA-C 11/02/19 1717    Derwood Kaplan, MD 11/03/19 1555

## 2019-11-02 NOTE — ED Triage Notes (Signed)
Pt c/o abdominal pain under right breast and between shoulder blades for the last 2 weeks; pt was seen at urgent care and was told to come here

## 2019-11-02 NOTE — Discharge Instructions (Signed)
Your lab tests, ultrasound and chest x-ray are all normal today.  However as discussed you may need to undergo a HIDA scan for further evaluation of the function of your gallbladder.  While you are waiting for evaluation by gastroenterologist I recommend continuing to take the Prevacid which has been prescribed you since this seemed to help your symptoms in the past.  Refer to the instructions below for pointers for helping to minimize acid reflux.  In the interim, return here for any worsening symptoms.

## 2019-11-02 NOTE — ED Triage Notes (Signed)
Pt presents with upper right quadrant pain and pain between shoulder blades with excessive belching, has had diarrhea for past week, pt also has left ear pain

## 2019-11-02 NOTE — Discharge Instructions (Signed)
Deferred to ED for gallbladder disease rule out.

## 2019-11-02 NOTE — ED Provider Notes (Signed)
  [facility] CARE CENTER   [id number] 11/02/19 Arrival Time: 1127 Abbreviate note: CC: ABDOMINAL DISCOMFORT  SUBJECTIVE:  Anna Andrews is a 24 y.o. female who presents with complaint of abdominal discomfort that began 2 weeks ago.  Denies a precipitating event, trauma, close contacts with similar symptoms, recent travel or antibiotic use.  Localizes pain to RUQ.  Describes as worsening, constant and dull in character.  Has tried OTC tylenol without relief.  Worse with eating. Denies similar symptoms in the past.  Complains of associated diarrhea, dizziness, and belching.     Patient's last menstrual period was 10/13/2019.  ROS: As per HPI.  All other pertinent ROS negative.      OBJECTIVE:  Vitals:   11/02/19 1143  BP: 127/87  Pulse: 84  Resp: 20  Temp: 98.4 F (36.9 C)  SpO2: 98%    General appearance: Alert; NAD HEENT: NCAT.  Oropharynx clear.  Lungs: normal respiratory effort Heart: regular rate and rhythm.   Skin: warm and dry Neurologic: normal gait Psychological: alert and cooperative; normal mood and affect  ASSESSMENT & PLAN:  1. RUQ abdominal pain    Deferred to ED for gallbladder disease rule out.     Rennis Harding, PA-C 11/02/19 1204

## 2019-11-02 NOTE — ED Triage Notes (Signed)
Patient is being discharged from the Urgent Care and sent to the Emergency Department via private vehicle . Per B Wurst , patient is in need of higher level of care due to possible cholecystitis. Patient is aware and verbalizes understanding of plan of care.  Vitals:   11/02/19 1143  BP: 127/87  Pulse: 84  Resp: 20  Temp: 98.4 F (36.9 C)  SpO2: 98%

## 2019-11-07 ENCOUNTER — Other Ambulatory Visit: Payer: Self-pay | Admitting: Obstetrics & Gynecology

## 2019-11-09 DIAGNOSIS — Z419 Encounter for procedure for purposes other than remedying health state, unspecified: Secondary | ICD-10-CM | POA: Diagnosis not present

## 2019-12-09 DIAGNOSIS — Z419 Encounter for procedure for purposes other than remedying health state, unspecified: Secondary | ICD-10-CM | POA: Diagnosis not present

## 2020-01-03 ENCOUNTER — Other Ambulatory Visit: Payer: Self-pay | Admitting: Advanced Practice Midwife

## 2020-01-09 DIAGNOSIS — Z419 Encounter for procedure for purposes other than remedying health state, unspecified: Secondary | ICD-10-CM | POA: Diagnosis not present

## 2020-02-08 DIAGNOSIS — Z419 Encounter for procedure for purposes other than remedying health state, unspecified: Secondary | ICD-10-CM | POA: Diagnosis not present

## 2020-03-10 DIAGNOSIS — Z419 Encounter for procedure for purposes other than remedying health state, unspecified: Secondary | ICD-10-CM | POA: Diagnosis not present

## 2020-04-10 DIAGNOSIS — Z419 Encounter for procedure for purposes other than remedying health state, unspecified: Secondary | ICD-10-CM | POA: Diagnosis not present

## 2020-05-03 ENCOUNTER — Other Ambulatory Visit: Payer: Self-pay | Admitting: Advanced Practice Midwife

## 2020-05-03 MED ORDER — SERTRALINE HCL 50 MG PO TABS: 50.0000 mg | ORAL_TABLET | Freq: Every day | ORAL | 11 refills | Status: DC

## 2020-05-03 NOTE — Progress Notes (Signed)
Changed to zoloft, feels like lexapro is making her anxious

## 2020-05-08 DIAGNOSIS — Z419 Encounter for procedure for purposes other than remedying health state, unspecified: Secondary | ICD-10-CM | POA: Diagnosis not present

## 2020-06-08 DIAGNOSIS — Z419 Encounter for procedure for purposes other than remedying health state, unspecified: Secondary | ICD-10-CM | POA: Diagnosis not present

## 2020-07-08 DIAGNOSIS — Z419 Encounter for procedure for purposes other than remedying health state, unspecified: Secondary | ICD-10-CM | POA: Diagnosis not present

## 2020-08-08 DIAGNOSIS — Z419 Encounter for procedure for purposes other than remedying health state, unspecified: Secondary | ICD-10-CM | POA: Diagnosis not present

## 2020-09-07 DIAGNOSIS — Z419 Encounter for procedure for purposes other than remedying health state, unspecified: Secondary | ICD-10-CM | POA: Diagnosis not present

## 2020-09-13 ENCOUNTER — Encounter: Payer: Self-pay | Admitting: Emergency Medicine

## 2020-09-13 ENCOUNTER — Ambulatory Visit
Admission: EM | Admit: 2020-09-13 | Discharge: 2020-09-13 | Disposition: A | Discharge status: Home / Self Care | Payer: 59 | Attending: Emergency Medicine | Admitting: Emergency Medicine

## 2020-09-13 DIAGNOSIS — M542 Cervicalgia: Secondary | ICD-10-CM | POA: Diagnosis not present

## 2020-09-13 DIAGNOSIS — M549 Dorsalgia, unspecified: Secondary | ICD-10-CM

## 2020-09-13 MED ORDER — DEXAMETHASONE SODIUM PHOSPHATE 10 MG/ML IJ SOLN
10.0000 mg | Freq: Once | INTRAMUSCULAR | Status: AC
  Administered 2020-09-13: 10 mg via INTRAMUSCULAR

## 2020-09-13 MED ORDER — CYCLOBENZAPRINE HCL 10 MG PO TABS: 10.0000 mg | ORAL_TABLET | Freq: Two times a day (BID) | ORAL | 0 refills | Status: DC | PRN

## 2020-09-13 MED ORDER — PREDNISONE 20 MG PO TABS: 20.0000 mg | ORAL_TABLET | Freq: Two times a day (BID) | ORAL | 0 refills | Status: AC

## 2020-09-13 NOTE — ED Triage Notes (Addendum)
Neck pain to LT side of neck/ shoulder that is shooting to RT side of neck with movement for past 2 days.  Took meloxicam  and tylenol with no relief.

## 2020-09-13 NOTE — Discharge Instructions (Addendum)
Steroid shot given in office Continue conservative management of rest, ice, and gentle stretches Prednisone prescribed Take cyclobenzaprine at nighttime for symptomatic relief. Avoid driving or operating heavy machinery while using medication. Follow up with PCP if symptoms persist Return or go to the ER if you have any new or worsening symptoms (fever, chills, chest pain, abdominal pain, changes in bowel or bladder habits, pain radiating into lower legs, etc...)

## 2020-09-13 NOTE — ED Provider Notes (Signed)
[facility] CARE CENTER   [id number] 09/13/20 Arrival Time: 1412  CC: Neck PAIN  SUBJECTIVE: History from: patient. Anna Andrews is a 25 y.o. female complains of LT sided neck/ back pain x 2 days.  Denies a precipitating event or specific injury.  However, does hair and bartends.  Localizes the pain to the LT upper back.  Describes the pain as intermittent and sharp  in character.  Has tried OTC medications without relief.  Symptoms are made worse with looking over RT shoulder.  Denies similar symptoms in the past.  Denies fever, chills, erythema, ecchymosis, effusion, weakness, numbness and tingling  ROS: As per HPI.  All other pertinent ROS negative.     Past Medical History:  Diagnosis Date   Acid reflux    Anxiety    Eczema    Past Surgical History:  Procedure Laterality Date   NO PAST SURGERIES     No Known Allergies No current facility-administered medications on file prior to encounter.   Current Outpatient Medications on File Prior to Encounter  Medication Sig Dispense Refill   acetaminophen (TYLENOL) 325 MG tablet Take 2 tablets (650 mg total) by mouth every 4 (four) hours as needed (for pain scale < 4). 30 tablet 0   Blood Pressure Monitor MISC For regular home bp monitoring during pregnancy 1 each 0   calcium carbonate (TUMS - DOSED IN MG ELEMENTAL CALCIUM) 500 MG chewable tablet Chew 2 tablets by mouth 2 (two) times daily as needed for indigestion or heartburn.     hydrocortisone cream 1 % Apply 1 application topically 2 (two) times daily as needed for itching (For eczema.).     lansoprazole (PREVACID) 30 MG capsule Take 1 capsule (30 mg total) by mouth daily at 12 noon. 30 capsule 0   norethindrone-ethinyl estradiol (JUNEL FE 1/20) 1-20 MG-MCG tablet Take 1 tablet by mouth daily. 1 Package 11   Norethindrone-Ethinyl Estradiol-Fe Biphas (LO LOESTRIN FE) 1 MG-10 MCG / 10 MCG tablet Take 1 tablet by mouth daily. 1 Package 11   prenatal vitamin w/FE, FA (PRENATAL 1 + 1) 27-1  MG TABS tablet Take 1 tablet by mouth daily at 12 noon. 30 tablet 12   sertraline (ZOLOFT) 50 MG tablet Take 1 tablet (50 mg total) by mouth daily. 30 tablet 11   [DISCONTINUED] enalapril (VASOTEC) 5 MG tablet Take 1 tablet (5 mg total) by mouth daily. (Patient not taking: Reported on 06/02/2019) 30 tablet 2   Social History   Socioeconomic History   Marital status: Significant Other    Spouse name: Riley Lam   Number of children: Not on file   Years of education: Not on file   Highest education level: Professional school degree (e.g., MD, DDS, DVM, JD)  Occupational History   Not on file  Tobacco Use   Smoking status: Former    Packs/day: 0.50    Pack years: 0.00    Types: Cigarettes   Smokeless tobacco: Never   Tobacco comments:    occ  Vaping Use   Vaping Use: Never used  Substance and Sexual Activity   Alcohol use: No   Drug use: No   Sexual activity: Not Currently    Birth control/protection: Condom  Other Topics Concern   Not on file  Social History Narrative   Not on file   Social Determinants of Health   Financial Resource Strain: Not on file  Food Insecurity: Not on file  Transportation Needs: Not on file  Physical Activity: Not on  file  Stress: Not on file  Social Connections: Not on file  Intimate Partner Violence: Not on file   Family History  Problem Relation Age of Onset   Hypertension Paternal Grandfather    CAD Paternal Grandmother    Hypertension Mother     OBJECTIVE:  Vitals:   09/13/20 1515  BP: (!) 134/93  Pulse: (!) 107  Resp: 16  Temp: 98.3 F (36.8 C)  TempSrc: Oral  SpO2: 98%    General appearance: ALERT; in no acute distress.  Head: NCAT Lungs: Normal respiratory effort Musculoskeletal: Neck Inspection: Skin warm, dry, clear and intact without obvious erythema, effusion, or ecchymosis.  Palpation: TTP over LT upper back over paravertebral muscles and LT side of cervical muscles ROM: FROM active and passive Strength: 5/5 shld  abduction, 5/5 shld adduction, 5/5 elbow flexion, 5/5 elbow extension, 5/5 grip strength Skin: warm and dry Neurologic: Ambulates without difficulty; Sensation intact about the upper extremities Psychological: alert and cooperative; normal mood and affect   ASSESSMENT & PLAN:  1. Neck pain   2. Upper back pain on left side     Meds ordered this encounter  Medications   predniSONE (DELTASONE) 20 MG tablet    Sig: Take 1 tablet (20 mg total) by mouth 2 (two) times daily with a meal for 5 days.    Dispense:  10 tablet    Refill:  0    Order Specific Question:   Supervising Provider    Answer:   Eustace Moore [[id number]]   cyclobenzaprine (FLEXERIL) 10 MG tablet    Sig: Take 1 tablet (10 mg total) by mouth 2 (two) times daily as needed for muscle spasms.    Dispense:  20 tablet    Refill:  0    Order Specific Question:   Supervising Provider    Answer:   Eustace Moore [[id number]]   dexamethasone (DECADRON) injection 10 mg   Steroid shot given in office Continue conservative management of rest, ice, and gentle stretches Prednisone prescribed Take cyclobenzaprine at nighttime for symptomatic relief. Avoid driving or operating heavy machinery while using medication. Follow up with PCP if symptoms persist Return or go to the ER if you have any new or worsening symptoms (fever, chills, chest pain, abdominal pain, changes in bowel or bladder habits, pain radiating into lower legs, etc...)   Reviewed expectations re: course of current medical issues. Questions answered. Outlined signs and symptoms indicating need for more acute intervention. Patient verbalized understanding. After Visit Summary given.     Rennis Harding, PA-C 09/13/20 1537

## 2020-10-04 ENCOUNTER — Ambulatory Visit
Admission: EM | Admit: 2020-10-04 | Discharge: 2020-10-04 | Disposition: A | Discharge status: Home / Self Care | Payer: 59 | Attending: Emergency Medicine | Admitting: Emergency Medicine

## 2020-10-04 ENCOUNTER — Encounter: Payer: Self-pay | Admitting: Emergency Medicine

## 2020-10-04 ENCOUNTER — Other Ambulatory Visit: Payer: Self-pay

## 2020-10-04 DIAGNOSIS — K219 Gastro-esophageal reflux disease without esophagitis: Secondary | ICD-10-CM | POA: Diagnosis not present

## 2020-10-04 DIAGNOSIS — R110 Nausea: Secondary | ICD-10-CM

## 2020-10-04 DIAGNOSIS — R1013 Epigastric pain: Secondary | ICD-10-CM

## 2020-10-04 MED ORDER — ALUM & MAG HYDROXIDE-SIMETH 200-200-20 MG/5ML PO SUSP
30.0000 mL | Freq: Once | ORAL | Status: AC
  Administered 2020-10-04: 30 mL via ORAL

## 2020-10-04 MED ORDER — OMEPRAZOLE 20 MG PO CPDR: 20.0000 mg | DELAYED_RELEASE_CAPSULE | Freq: Every day | ORAL | 0 refills | Status: DC

## 2020-10-04 MED ORDER — LIDOCAINE VISCOUS HCL 2 % MT SOLN
15.0000 mL | Freq: Once | OROMUCOSAL | Status: AC
  Administered 2020-10-04: 15 mL via ORAL

## 2020-10-04 MED ORDER — ONDANSETRON HCL 4 MG PO TABS: 4.0000 mg | ORAL_TABLET | Freq: Four times a day (QID) | ORAL | 0 refills | Status: DC

## 2020-10-04 NOTE — ED Provider Notes (Signed)
[facility]   [id number] 10/04/20 Arrival Time: 5465  CC: ABDOMINAL DISCOMFORT  SUBJECTIVE:  Anna Andrews is a 25 y.o. female who presents with complaint of abdominal discomfort that began 1 week with acute on chronic acid reflux.  Reports recent covid diagnosis.  Localizes pain to epigastric region.  Describes intermittent and burning.  Has not tried OTC medications  Worse with eating red sauce.  Reports similar symptoms in the past.   Denies fever, chills, nausea, vomiting, chest pain, SOB, diarrhea, constipation, hematochezia, melena, dysuria, difficulty urinating, increased frequency or urgency, flank pain, loss of bowel or bladder function.  Patient's last menstrual period was 09/03/2020 (approximate).  ROS: As per HPI.  All other pertinent ROS negative.     Past Medical History:  Diagnosis Date   Acid reflux    Anxiety    Eczema    Past Surgical History:  Procedure Laterality Date   NO PAST SURGERIES     No Known Allergies No current facility-administered medications on file prior to encounter.   Current Outpatient Medications on File Prior to Encounter  Medication Sig Dispense Refill   acetaminophen (TYLENOL) 325 MG tablet Take 2 tablets (650 mg total) by mouth every 4 (four) hours as needed (for pain scale < 4). 30 tablet 0   Blood Pressure Monitor MISC For regular home bp monitoring during pregnancy 1 each 0   calcium carbonate (TUMS - DOSED IN MG ELEMENTAL CALCIUM) 500 MG chewable tablet Chew 2 tablets by mouth 2 (two) times daily as needed for indigestion or heartburn.     cyclobenzaprine (FLEXERIL) 10 MG tablet Take 1 tablet (10 mg total) by mouth 2 (two) times daily as needed for muscle spasms. 20 tablet 0   hydrocortisone cream 1 % Apply 1 application topically 2 (two) times daily as needed for itching (For eczema.).     norethindrone-ethinyl estradiol (JUNEL FE 1/20) 1-20 MG-MCG tablet Take 1 tablet by mouth daily. 1 Package 11   Norethindrone-Ethinyl  Estradiol-Fe Biphas (LO LOESTRIN FE) 1 MG-10 MCG / 10 MCG tablet Take 1 tablet by mouth daily. 1 Package 11   prenatal vitamin w/FE, FA (PRENATAL 1 + 1) 27-1 MG TABS tablet Take 1 tablet by mouth daily at 12 noon. 30 tablet 12   sertraline (ZOLOFT) 50 MG tablet Take 1 tablet (50 mg total) by mouth daily. 30 tablet 11   [DISCONTINUED] enalapril (VASOTEC) 5 MG tablet Take 1 tablet (5 mg total) by mouth daily. (Patient not taking: Reported on 06/02/2019) 30 tablet 2   Social History   Socioeconomic History   Marital status: Significant Other    Spouse name: Riley Lam   Number of children: Not on file   Years of education: Not on file   Highest education level: Professional school degree (e.g., MD, DDS, DVM, JD)  Occupational History   Not on file  Tobacco Use   Smoking status: Former    Packs/day: 0.50    Types: Cigarettes   Smokeless tobacco: Never   Tobacco comments:    occ  Vaping Use   Vaping Use: Never used  Substance and Sexual Activity   Alcohol use: No   Drug use: No   Sexual activity: Not Currently    Birth control/protection: Condom  Other Topics Concern   Not on file  Social History Narrative   Not on file   Social Determinants of Health   Financial Resource Strain: Not on file  Food Insecurity: Not on file  Transportation Needs: Not  on file  Physical Activity: Not on file  Stress: Not on file  Social Connections: Not on file  Intimate Partner Violence: Not on file   Family History  Problem Relation Age of Onset   Hypertension Paternal Grandfather    CAD Paternal Grandmother    Hypertension Mother    OBJECTIVE:  Vitals:   10/04/20 1900  BP: (!) 145/88  Pulse: (!) 103  Resp: 16  Temp: 99.4 F (37.4 C)  TempSrc: Oral  SpO2: 98%    General appearance: Alert; NAD HEENT: NCAT.  Oropharynx clear.  Lungs: clear to auscultation bilaterally without adventitious breath sounds Heart: regular rate and rhythm.   Abdomen: soft, non-distended; normal active  bowel sounds; mildly TTP over epigastrum; nontender at McBurney's point; no guarding Back: no CVA tenderness Extremities: no edema; symmetrical with no gross deformities Skin: warm and dry Neurologic: normal gait Psychological: alert and cooperative; normal mood and affect  ASSESSMENT & PLAN:  1. Gastroesophageal reflux disease without esophagitis   2. Nausea without vomiting   3. Epigastric discomfort     Meds ordered this encounter  Medications   omeprazole (PRILOSEC) 20 MG capsule    Sig: Take 1 capsule (20 mg total) by mouth daily.    Dispense:  30 capsule    Refill:  0    Order Specific Question:   Supervising Provider    Answer:   Eustace Moore [[id number]]   ondansetron (ZOFRAN) 4 MG tablet    Sig: Take 1 tablet (4 mg total) by mouth every 6 (six) hours.    Dispense:  12 tablet    Refill:  0    Order Specific Question:   Supervising Provider    Answer:   Eustace Moore [[id number]]   AND Linked Order Group    alum & mag hydroxide-simeth (MAALOX/MYLANTA) 200-200-20 MG/5ML suspension 30 mL    lidocaine (XYLOCAINE) 2 % viscous mouth solution 15 mL   Unable to rule out blood clot in urgent care setting.  Offered patient further evaluation and management in the ED.  Patient declines at this time and would like to try outpatient therapy first.  Aware of the risk associated with this decision including missed diagnosis, organ damage, organ failure, and/or death.  Patient aware and in agreement.     GI cocktail given office Omeprazole prescribed take daily Zofran prescribed.  Take as needed for nausea.   Avoid eating 2-3 hours before bed Elevate head of bed.  Avoid chocolate, caffeine, alcohol, onion, and mint prior to bed.  This relaxes the bottom part of your esophagus and can make your symptoms worse.  Follow up with PCP if symptoms persists Return or go to the ED if you have any new or worsening symptoms fever, chills, nausea, vomiting, abdominal pain, changes in bowel  or bladder, etc...  Reviewed expectations re: course of current medical issues. Questions answered. Outlined signs and symptoms indicating need for more acute intervention. Patient verbalized understanding. After Visit Summary given.   Rennis Harding, PA-C 10/04/20 1936

## 2020-10-04 NOTE — ED Triage Notes (Signed)
Mid back pain and hand numbness.  States she is having a problem with acid reflux.  States it gets worse when she lays down.  Tested covid positive 5 days ago.

## 2020-10-04 NOTE — Discharge Instructions (Addendum)
Unable to rule out blood clot in urgent care setting.  Offered patient further evaluation and management in the ED.  Patient declines at this time and would like to try outpatient therapy first.  Aware of the risk associated with this decision including missed diagnosis, organ damage, organ failure, and/or death.  Patient aware and in agreement.     GI cocktail given office Omeprazole prescribed take daily Zofran prescribed.  Take as needed for nausea.   Avoid eating 2-3 hours before bed Elevate head of bed.  Avoid chocolate, caffeine, alcohol, onion, and mint prior to bed.  This relaxes the bottom part of your esophagus and can make your symptoms worse.  Follow up with PCP if symptoms persists Return or go to the ED if you have any new or worsening symptoms fever, chills, nausea, vomiting, abdominal pain, changes in bowel or bladder, etc..Marland Kitchen

## 2020-10-05 ENCOUNTER — Encounter (HOSPITAL_COMMUNITY): Payer: Self-pay | Admitting: Emergency Medicine

## 2020-10-05 ENCOUNTER — Other Ambulatory Visit: Payer: Self-pay

## 2020-10-05 ENCOUNTER — Emergency Department (HOSPITAL_COMMUNITY)
Admission: EM | Admit: 2020-10-05 | Discharge: 2020-10-05 | Disposition: A | Discharge status: Home / Self Care | Payer: 59 | Attending: Emergency Medicine | Admitting: Emergency Medicine

## 2020-10-05 ENCOUNTER — Emergency Department (HOSPITAL_COMMUNITY): Payer: 59

## 2020-10-05 DIAGNOSIS — Z8759 Personal history of other complications of pregnancy, childbirth and the puerperium: Secondary | ICD-10-CM | POA: Diagnosis not present

## 2020-10-05 DIAGNOSIS — Z79899 Other long term (current) drug therapy: Secondary | ICD-10-CM | POA: Diagnosis not present

## 2020-10-05 DIAGNOSIS — U071 COVID-19: Secondary | ICD-10-CM | POA: Diagnosis not present

## 2020-10-05 DIAGNOSIS — K2100 Gastro-esophageal reflux disease with esophagitis, without bleeding: Secondary | ICD-10-CM | POA: Diagnosis not present

## 2020-10-05 DIAGNOSIS — Z87891 Personal history of nicotine dependence: Secondary | ICD-10-CM | POA: Diagnosis not present

## 2020-10-05 DIAGNOSIS — F419 Anxiety disorder, unspecified: Secondary | ICD-10-CM | POA: Diagnosis not present

## 2020-10-05 NOTE — ED Provider Notes (Signed)
[facility] EMERGENCY DEPARTMENT Provider Note   CSN: [id number] Arrival date & time: 10/05/20  9357     History Chief Complaint  Patient presents with   Anxiety    Anna Andrews is a 25 y.o. female.  Patient is COVID-positive and is complaining of GERD symptoms she was seen recently and given a GI cocktail that helped and a prescription for potent pulm inhaler.  She has not got the prescription yet.  Patient complains of epigastric discomfort  The history is provided by the patient and medical records. No language interpreter was used.  Anxiety This is a recurrent problem. The problem occurs constantly. The problem has not changed since onset.Associated symptoms include abdominal pain. Pertinent negatives include no chest pain and no headaches. Nothing aggravates the symptoms. She has tried nothing for the symptoms. The treatment provided no relief.      Past Medical History:  Diagnosis Date   Acid reflux    Anxiety    Eczema     Patient Active Problem List   Diagnosis Date Noted   Gestational hypertension 04/27/2019   Supervision of normal first pregnancy 10/19/2018   Anxiety 10/19/2018    Past Surgical History:  Procedure Laterality Date   NO PAST SURGERIES       OB History     Gravida  1   Para  1   Term  1   Preterm  0   AB  0   Living  1      SAB  0   IAB  0   Ectopic  0   Multiple  0   Live Births  1           Family History  Problem Relation Age of Onset   Hypertension Paternal Grandfather    CAD Paternal Grandmother    Hypertension Mother     Social History   Tobacco Use   Smoking status: Former    Packs/day: 0.50    Types: Cigarettes   Smokeless tobacco: Never   Tobacco comments:    occ  Vaping Use   Vaping Use: Never used  Substance Use Topics   Alcohol use: No   Drug use: No    Home Medications Prior to Admission medications   Medication Sig Start Date End Date Taking? Authorizing Provider  acetaminophen  (TYLENOL) 325 MG tablet Take 2 tablets (650 mg total) by mouth every 4 (four) hours as needed (for pain scale < 4). 04/30/19   Fair, Hoyle Sauer, MD  Blood Pressure Monitor MISC For regular home bp monitoring during pregnancy 10/19/18   Cheral Marker, CNM  calcium carbonate (TUMS - DOSED IN MG ELEMENTAL CALCIUM) 500 MG chewable tablet Chew 2 tablets by mouth 2 (two) times daily as needed for indigestion or heartburn.    [provider]  cyclobenzaprine (FLEXERIL) 10 MG tablet Take 1 tablet (10 mg total) by mouth 2 (two) times daily as needed for muscle spasms. 09/13/20   Wurst, [place], PA-C  hydrocortisone cream 1 % Apply 1 application topically 2 (two) times daily as needed for itching (For eczema.).    [provider]  norethindrone-ethinyl estradiol (JUNEL FE 1/20) 1-20 MG-MCG tablet Take 1 tablet by mouth daily. 07/04/19   Cheral Marker, CNM  Norethindrone-Ethinyl Estradiol-Fe Biphas (LO LOESTRIN FE) 1 MG-10 MCG / 10 MCG tablet Take 1 tablet by mouth daily. 06/02/19   Cresenzo-Dishmon, Scarlette Calico, CNM  omeprazole (PRILOSEC) 20 MG capsule Take 1 capsule (20 mg total)  by mouth daily. 10/04/20   Wurst, [place], PA-C  ondansetron (ZOFRAN) 4 MG tablet Take 1 tablet (4 mg total) by mouth every 6 (six) hours. 10/04/20   Wurst, [place], PA-C  prenatal vitamin w/FE, FA (PRENATAL 1 + 1) 27-1 MG TABS tablet Take 1 tablet by mouth daily at 12 noon. 09/13/18   Adline Potter, NP  sertraline (ZOLOFT) 50 MG tablet Take 1 tablet (50 mg total) by mouth daily. 05/03/20   Cresenzo-Dishmon, Scarlette Calico, CNM  enalapril (VASOTEC) 5 MG tablet Take 1 tablet (5 mg total) by mouth daily. Patient not taking: Reported on 06/02/2019 05/05/19 11/02/19  Jacklyn Shell, CNM    Allergies    Patient has no known allergies.  Review of Systems   Review of Systems  Constitutional:  Negative for appetite change and fatigue.  HENT:  Negative for congestion, ear discharge and sinus pressure.   Eyes:   Negative for discharge.  Respiratory:  Negative for cough.   Cardiovascular:  Negative for chest pain.  Gastrointestinal:  Positive for abdominal pain. Negative for diarrhea.  Genitourinary:  Negative for frequency and hematuria.  Musculoskeletal:  Negative for back pain.  Skin:  Negative for rash.  Neurological:  Negative for seizures and headaches.  Psychiatric/Behavioral:  Positive for agitation. Negative for hallucinations.    Physical Exam Updated Vital Signs BP 132/83   Pulse 82   Temp 99.3 F (37.4 C)   Resp 16   Ht 5\' 9"  (1.753 m)   Wt 105.2 kg   LMP 08/29/2020 (Approximate) Comment: shielded  SpO2 100%   BMI 34.25 kg/m   Physical Exam Vitals reviewed.  Constitutional:      Appearance: She is well-developed.  HENT:     Head: Normocephalic.     Nose: Nose normal.  Eyes:     General: No scleral icterus.    Conjunctiva/sclera: Conjunctivae normal.  Neck:     Thyroid: No thyromegaly.  Cardiovascular:     Rate and Rhythm: Normal rate and regular rhythm.     Heart sounds: No murmur heard.   No friction rub. No gallop.  Pulmonary:     Breath sounds: No stridor. No wheezing or rales.  Chest:     Chest wall: No tenderness.  Abdominal:     General: There is no distension.     Tenderness: There is abdominal tenderness. There is no rebound.  Musculoskeletal:        General: Normal range of motion.     Cervical back: Neck supple.  Lymphadenopathy:     Cervical: No cervical adenopathy.  Skin:    Findings: No erythema or rash.  Neurological:     Mental Status: She is alert and oriented to person, place, and time.     Motor: No abnormal muscle tone.     Coordination: Coordination normal.  Psychiatric:     Comments: Mild anxiety     ED Results / Procedures / Treatments   Labs (all labs ordered are listed, but only abnormal results are displayed) Labs Reviewed - No data to display  EKG None  Radiology DG Chest [facility] 1 View  Result Date:  10/05/2020 CLINICAL DATA:  25 year old female positive for COVID-19. 4 days ago. Chest pain. EXAM: PORTABLE CHEST 1 VIEW COMPARISON:  Chest radiographs 11/02/2019. FINDINGS: Portable AP upright view at 0914 hours. Lung volumes and mediastinal contours remain normal. Visualized tracheal air column is within normal limits. Allowing for portable technique the lungs are clear. No pneumothorax or pleural effusion. No osseous  abnormality identified. Negative visible bowel gas pattern. IMPRESSION: Negative portable chest. Electronically Signed   By: Odessa Fleming M.D.   On: 10/05/2020 09:51    Procedures Procedures   Medications Ordered in ED Medications - No data to display  ED Course  I have reviewed the triage vital signs and the nursing notes.  Pertinent labs & imaging results that were available during my care of the patient were reviewed by me and considered in my medical decision making (see chart for details).    MDM Rules/Calculators/A&P                           Patient with mild anxiety and GERD.  She is going to start a proton pump inhibitor.  EKG and chest x-ray are unremarkable. Final Clinical Impression(s) / ED Diagnoses Final diagnoses:  Anxiety  Gastroesophageal reflux disease with esophagitis without hemorrhage    Rx / DC Orders ED Discharge Orders     None        Bethann Berkshire, MD 10/05/20 1023

## 2020-10-05 NOTE — ED Notes (Addendum)
Son has been diagnosed with covid and flu yesterday. Pt positive for covid on last sat.

## 2020-10-05 NOTE — Discharge Instructions (Addendum)
Follow-up with Dr. Marletta Lor in the next month.  Make sure you take your stomach medicine that I gave you.

## 2020-10-05 NOTE — ED Triage Notes (Signed)
Pt she was seen yesterday at [facility] and dx with acid reflux. Pt states last night she began having chills and tingling in her hands and has been worried ever since.

## 2020-10-08 DIAGNOSIS — Z419 Encounter for procedure for purposes other than remedying health state, unspecified: Secondary | ICD-10-CM | POA: Diagnosis not present

## 2020-10-13 ENCOUNTER — Other Ambulatory Visit: Payer: Self-pay

## 2020-10-13 ENCOUNTER — Emergency Department (HOSPITAL_COMMUNITY)
Admission: EM | Admit: 2020-10-13 | Discharge: 2020-10-13 | Disposition: A | Discharge status: Home / Self Care | Payer: 59 | Attending: Emergency Medicine | Admitting: Emergency Medicine

## 2020-10-13 DIAGNOSIS — M791 Myalgia, unspecified site: Secondary | ICD-10-CM | POA: Diagnosis not present

## 2020-10-13 DIAGNOSIS — Z87891 Personal history of nicotine dependence: Secondary | ICD-10-CM | POA: Diagnosis not present

## 2020-10-13 LAB — CBC WITH DIFFERENTIAL/PLATELET
Abs Immature Granulocytes: 0.07 10*3/uL (ref 0.00–0.07)
Basophils Absolute: 0.1 10*3/uL (ref 0.0–0.1)
Basophils Relative: 1 %
Eosinophils Absolute: 0.2 10*3/uL (ref 0.0–0.5)
Eosinophils Relative: 2 %
HCT: 39.6 % (ref 36.0–46.0)
Hemoglobin: 12.4 g/dL (ref 12.0–15.0)
Immature Granulocytes: 1 %
Lymphocytes Relative: 20 %
Lymphs Abs: 2.2 10*3/uL (ref 0.7–4.0)
MCH: 26 pg (ref 26.0–34.0)
MCHC: 31.3 g/dL (ref 30.0–36.0)
MCV: 83 fL (ref 80.0–100.0)
Monocytes Absolute: 0.5 10*3/uL (ref 0.1–1.0)
Monocytes Relative: 5 %
Neutro Abs: 8 10*3/uL — ABNORMAL HIGH (ref 1.7–7.7)
Neutrophils Relative %: 71 %
Platelets: 412 10*3/uL — ABNORMAL HIGH (ref 150–400)
RBC: 4.77 MIL/uL (ref 3.87–5.11)
RDW: 14.6 % (ref 11.5–15.5)
WBC: 11 10*3/uL — ABNORMAL HIGH (ref 4.0–10.5)
nRBC: 0 % (ref 0.0–0.2)

## 2020-10-13 LAB — COMPREHENSIVE METABOLIC PANEL
ALT: 20 U/L (ref 0–44)
AST: 15 U/L (ref 15–41)
Albumin: 4.3 g/dL (ref 3.5–5.0)
Alkaline Phosphatase: 71 U/L (ref 38–126)
Anion gap: 7 (ref 5–15)
BUN: 15 mg/dL (ref 6–20)
CO2: 27 mmol/L (ref 22–32)
Calcium: 9.3 mg/dL (ref 8.9–10.3)
Chloride: 103 mmol/L (ref 98–111)
Creatinine, Ser: 0.72 mg/dL (ref 0.44–1.00)
GFR, Estimated: >60 mL/min (ref >60)
Glucose, Bld: 108 mg/dL — ABNORMAL HIGH (ref 70–99)
Potassium: 3.6 mmol/L (ref 3.5–5.1)
Sodium: 137 mmol/L (ref 135–145)
Total Bilirubin: 0.3 mg/dL (ref 0.3–1.2)
Total Protein: 8.3 g/dL — ABNORMAL HIGH (ref 6.5–8.1)

## 2020-10-13 LAB — CK: Total CK: 119 U/L (ref 38–234)

## 2020-10-13 LAB — I-STAT BETA HCG BLOOD, ED (MC, WL, AP ONLY): I-stat hCG, quantitative: <5 m[IU]/mL (ref <5)

## 2020-10-13 MED ORDER — KETOROLAC TROMETHAMINE 30 MG/ML IJ SOLN
30.0000 mg | Freq: Once | INTRAMUSCULAR | Status: AC
  Administered 2020-10-13: 30 mg via INTRAVENOUS
  Filled 2020-10-13: qty 1

## 2020-10-13 MED ORDER — KETOROLAC TROMETHAMINE 60 MG/2ML IM SOLN: 60.0000 mg | Freq: Once | INTRAMUSCULAR | Status: DC

## 2020-10-13 NOTE — Discharge Instructions (Addendum)
Drink plenty of fluids.  Take either ibuprofen (Advil, Motrin) or naproxen (Aleve) as needed for pain.  For additional pain relief, you may add acetaminophen (Tylenol).  Combining acetaminophen with either ibuprofen or naproxen gives you better pain relief and with either medication by itself.

## 2020-10-13 NOTE — ED Provider Notes (Signed)
[facility] EMERGENCY DEPARTMENT Provider Note   CSN: [id number] Arrival date & time: 10/13/20  0046     History Chief Complaint  Patient presents with   Generalized Body Aches    Anna Andrews is a 25 y.o. female.  The history is provided by the patient.  She has history of eczema, gestational hypertension and comes in complaining of some intermittent aching in her arms and legs.  She was diagnosed with COVID 2 weeks ago, and started having intermittent pains in her arms and legs 3 days ago.  There is also pain across the subcostal region in her abdomen.  Pain has been as severe as 8/10.  She states that she has felt generally fatigued as well.  She has taken acetaminophen without relief.  She denies fever or chills and denies nausea or vomiting.  She denies any trauma or unusual activity.   Past Medical History:  Diagnosis Date   Acid reflux    Anxiety    Eczema     Patient Active Problem List   Diagnosis Date Noted   Gestational hypertension 04/27/2019   Supervision of normal first pregnancy 10/19/2018   Anxiety 10/19/2018    Past Surgical History:  Procedure Laterality Date   NO PAST SURGERIES       OB History     Gravida  1   Para  1   Term  1   Preterm  0   AB  0   Living  1      SAB  0   IAB  0   Ectopic  0   Multiple  0   Live Births  1           Family History  Problem Relation Age of Onset   Hypertension Paternal Grandfather    CAD Paternal Grandmother    Hypertension Mother     Social History   Tobacco Use   Smoking status: Former    Packs/day: 0.50    Types: Cigarettes   Smokeless tobacco: Never   Tobacco comments:    occ  Vaping Use   Vaping Use: Never used  Substance Use Topics   Alcohol use: No   Drug use: No    Home Medications Prior to Admission medications   Medication Sig Start Date End Date Taking? Authorizing Provider  acetaminophen (TYLENOL) 325 MG tablet Take 2 tablets (650 mg total) by mouth every 4  (four) hours as needed (for pain scale < 4). 04/30/19   Fair, Hoyle Sauer, MD  Blood Pressure Monitor MISC For regular home bp monitoring during pregnancy 10/19/18   Cheral Marker, CNM  calcium carbonate (TUMS - DOSED IN MG ELEMENTAL CALCIUM) 500 MG chewable tablet Chew 2 tablets by mouth 2 (two) times daily as needed for indigestion or heartburn.    [provider]  cyclobenzaprine (FLEXERIL) 10 MG tablet Take 1 tablet (10 mg total) by mouth 2 (two) times daily as needed for muscle spasms. 09/13/20   Wurst, [place], PA-C  hydrocortisone cream 1 % Apply 1 application topically 2 (two) times daily as needed for itching (For eczema.).    [provider]  norethindrone-ethinyl estradiol (JUNEL FE 1/20) 1-20 MG-MCG tablet Take 1 tablet by mouth daily. 07/04/19   Cheral Marker, CNM  Norethindrone-Ethinyl Estradiol-Fe Biphas (LO LOESTRIN FE) 1 MG-10 MCG / 10 MCG tablet Take 1 tablet by mouth daily. 06/02/19   Cresenzo-Dishmon, Scarlette Calico, CNM  omeprazole (PRILOSEC) 20 MG capsule Take 1 capsule (20  mg total) by mouth daily. 10/04/20   Wurst, [place], PA-C  ondansetron (ZOFRAN) 4 MG tablet Take 1 tablet (4 mg total) by mouth every 6 (six) hours. 10/04/20   Wurst, [place], PA-C  prenatal vitamin w/FE, FA (PRENATAL 1 + 1) 27-1 MG TABS tablet Take 1 tablet by mouth daily at 12 noon. 09/13/18   Adline Potter, NP  sertraline (ZOLOFT) 50 MG tablet Take 1 tablet (50 mg total) by mouth daily. 05/03/20   Cresenzo-Dishmon, Scarlette Calico, CNM  enalapril (VASOTEC) 5 MG tablet Take 1 tablet (5 mg total) by mouth daily. Patient not taking: Reported on 06/02/2019 05/05/19 11/02/19  Jacklyn Shell, CNM    Allergies    Patient has no known allergies.  Review of Systems   Review of Systems  All other systems reviewed and are negative.  Physical Exam Updated Vital Signs BP (!) 143/99   Pulse 74   Temp 98.9 F (37.2 C)   Resp 18   Ht 5\' 9"  (1.753 m)   Wt 104.3 kg   SpO2 99%   BMI 33.97  kg/m   Physical Exam Vitals and nursing note reviewed.  25 year old female, resting comfortably and in no acute distress. Vital signs are significant for elevated blood pressure. Oxygen saturation is 99%, which is normal. Head is normocephalic and atraumatic. PERRLA, EOMI. Oropharynx is clear. Neck is nontender and supple without adenopathy or JVD. Back is nontender and there is no CVA tenderness. Lungs are clear without rales, wheezes, or rhonchi. Chest is nontender. Heart has regular rate and rhythm without murmur. Abdomen is soft, flat, nontender without masses or hepatosplenomegaly and peristalsis is normoactive. Extremities have no cyanosis or edema, full range of motion is present. Skin is warm and dry without rash. Neurologic: Mental status is normal, cranial nerves are intact, there are no motor or sensory deficits.  ED Results / Procedures / Treatments   Labs (all labs ordered are listed, but only abnormal results are displayed) Labs Reviewed  CBC WITH DIFFERENTIAL/PLATELET - Abnormal; Notable for the following components:      Result Value   WBC 11.0 (*)    Platelets 412 (*)    Neutro Abs 8.0 (*)    All other components within normal limits  COMPREHENSIVE METABOLIC PANEL - Abnormal; Notable for the following components:   Glucose, Bld 108 (*)    Total Protein 8.3 (*)    All other components within normal limits  CK  I-STAT BETA HCG BLOOD, ED ([facility], [facility], [facility] ONLY)   Procedures Procedures   Medications Ordered in ED Medications  ketorolac (TORADOL) 30 MG/ML injection 30 mg (30 mg Intravenous Given 10/13/20 0243)    ED Course  I have reviewed the triage vital signs and the nursing notes.  Pertinent lab results that were available during my care of the patient were reviewed by me and considered in my medical decision making (see chart for details).   MDM Rules/Calculators/A&P                         Myalgias following COVID infection.  Myalgias and fatigue are likely  manifestations of long COVID.  Exam is benign.  Old records are reviewed, and it is noted that she was recently diagnosed with gastroesophageal reflux disease and started on omeprazole.  COVID test result is not in our records.  We will check screening labs including CK and give therapeutic trial of ketorolac.     Labs are reassuring.  CK is normal.  She got excellent pain relief with ketorolac.  She is discharged with instructions to use over-the-counter NSAIDs as needed, add acetaminophen as needed.  Follow-up with PCP.  Final Clinical Impression(s) / ED Diagnoses Final diagnoses:  Myalgia    Rx / DC Orders ED Discharge Orders     None        Dione Booze, MD 10/13/20 [phone]

## 2020-10-13 NOTE — ED Triage Notes (Signed)
Pt c/o body aches all over since being dx with covid 1 week ago. States she's tried OTC meds with no relief.

## 2020-10-14 ENCOUNTER — Other Ambulatory Visit: Payer: Self-pay

## 2020-10-14 DIAGNOSIS — R202 Paresthesia of skin: Secondary | ICD-10-CM | POA: Diagnosis not present

## 2020-10-14 DIAGNOSIS — Z87891 Personal history of nicotine dependence: Secondary | ICD-10-CM | POA: Insufficient documentation

## 2020-10-14 DIAGNOSIS — M79661 Pain in right lower leg: Secondary | ICD-10-CM | POA: Insufficient documentation

## 2020-10-14 DIAGNOSIS — X58XXXA Exposure to other specified factors, initial encounter: Secondary | ICD-10-CM | POA: Diagnosis not present

## 2020-10-14 DIAGNOSIS — S7012XA Contusion of left thigh, initial encounter: Secondary | ICD-10-CM | POA: Insufficient documentation

## 2020-10-14 DIAGNOSIS — S299XXA Unspecified injury of thorax, initial encounter: Secondary | ICD-10-CM | POA: Diagnosis present

## 2020-10-14 DIAGNOSIS — S29012A Strain of muscle and tendon of back wall of thorax, initial encounter: Secondary | ICD-10-CM | POA: Insufficient documentation

## 2020-10-15 ENCOUNTER — Encounter (HOSPITAL_COMMUNITY): Payer: Self-pay | Admitting: Emergency Medicine

## 2020-10-15 ENCOUNTER — Emergency Department (HOSPITAL_COMMUNITY)
Admission: EM | Admit: 2020-10-15 | Discharge: 2020-10-15 | Disposition: A | Discharge status: Home / Self Care | Payer: 59 | Attending: Emergency Medicine | Admitting: Emergency Medicine

## 2020-10-15 DIAGNOSIS — S29019A Strain of muscle and tendon of unspecified wall of thorax, initial encounter: Secondary | ICD-10-CM

## 2020-10-15 DIAGNOSIS — M79661 Pain in right lower leg: Secondary | ICD-10-CM

## 2020-10-15 DIAGNOSIS — S29012A Strain of muscle and tendon of back wall of thorax, initial encounter: Secondary | ICD-10-CM | POA: Diagnosis not present

## 2020-10-15 LAB — BASIC METABOLIC PANEL
Anion gap: 9 (ref 5–15)
BUN: 14 mg/dL (ref 6–20)
CO2: 25 mmol/L (ref 22–32)
Calcium: 9.1 mg/dL (ref 8.9–10.3)
Chloride: 103 mmol/L (ref 98–111)
Creatinine, Ser: 0.7 mg/dL (ref 0.44–1.00)
GFR, Estimated: >60 mL/min (ref >60)
Glucose, Bld: 110 mg/dL — ABNORMAL HIGH (ref 70–99)
Potassium: 3.8 mmol/L (ref 3.5–5.1)
Sodium: 137 mmol/L (ref 135–145)

## 2020-10-15 LAB — CBC
HCT: 36.9 % (ref 36.0–46.0)
Hemoglobin: 11.9 g/dL — ABNORMAL LOW (ref 12.0–15.0)
MCH: 26.5 pg (ref 26.0–34.0)
MCHC: 32.2 g/dL (ref 30.0–36.0)
MCV: 82.2 fL (ref 80.0–100.0)
Platelets: 412 10*3/uL — ABNORMAL HIGH (ref 150–400)
RBC: 4.49 MIL/uL (ref 3.87–5.11)
RDW: 14.6 % (ref 11.5–15.5)
WBC: 10.9 10*3/uL — ABNORMAL HIGH (ref 4.0–10.5)
nRBC: 0 % (ref 0.0–0.2)

## 2020-10-15 LAB — D-DIMER, QUANTITATIVE: D-Dimer, Quant: 0.34 ug/mL-FEU (ref 0.00–0.50)

## 2020-10-15 MED ORDER — IBUPROFEN 800 MG PO TABS: 800.0000 mg | ORAL_TABLET | Freq: Four times a day (QID) | ORAL | 0 refills | Status: DC | PRN

## 2020-10-15 NOTE — ED Provider Notes (Signed)
[facility] EMERGENCY DEPARTMENT Provider Note   CSN: [id number] Arrival date & time: 10/14/20  2341     History Chief Complaint  Patient presents with   Multiple complaints    Anna Andrews is a 25 y.o. female.  Patient presents to the emergency department with complaints of swelling of her right leg.  Patient has noticed swelling over the right calf area compared to the left.  Occasionally she gets numbness and tingling in the toes on that leg as well.  Denies any injury.  Patient also complaining of a pain in the center of her mid upper back that comes and goes.      Past Medical History:  Diagnosis Date   Acid reflux    Anxiety    Eczema     Patient Active Problem List   Diagnosis Date Noted   Gestational hypertension 04/27/2019   Supervision of normal first pregnancy 10/19/2018   Anxiety 10/19/2018    Past Surgical History:  Procedure Laterality Date   NO PAST SURGERIES       OB History     Gravida  1   Para  1   Term  1   Preterm  0   AB  0   Living  1      SAB  0   IAB  0   Ectopic  0   Multiple  0   Live Births  1           Family History  Problem Relation Age of Onset   Hypertension Paternal Grandfather    CAD Paternal Grandmother    Hypertension Mother     Social History   Tobacco Use   Smoking status: Former    Packs/day: 0.50    Types: Cigarettes   Smokeless tobacco: Never   Tobacco comments:    occ  Vaping Use   Vaping Use: Never used  Substance Use Topics   Alcohol use: No   Drug use: No    Home Medications Prior to Admission medications   Medication Sig Start Date End Date Taking? Authorizing Provider  ibuprofen (ADVIL) 800 MG tablet Take 1 tablet (800 mg total) by mouth every 6 (six) hours as needed for moderate pain. 10/15/20  Yes Jalyah Weinheimer, Canary Brim, MD  acetaminophen (TYLENOL) 325 MG tablet Take 2 tablets (650 mg total) by mouth every 4 (four) hours as needed (for pain scale < 4). 04/30/19   Fair,  Hoyle Sauer, MD  Blood Pressure Monitor MISC For regular home bp monitoring during pregnancy 10/19/18   Cheral Marker, CNM  calcium carbonate (TUMS - DOSED IN MG ELEMENTAL CALCIUM) 500 MG chewable tablet Chew 2 tablets by mouth 2 (two) times daily as needed for indigestion or heartburn.    [provider]  cyclobenzaprine (FLEXERIL) 10 MG tablet Take 1 tablet (10 mg total) by mouth 2 (two) times daily as needed for muscle spasms. 09/13/20   Wurst, [place], PA-C  hydrocortisone cream 1 % Apply 1 application topically 2 (two) times daily as needed for itching (For eczema.).    [provider]  norethindrone-ethinyl estradiol (JUNEL FE 1/20) 1-20 MG-MCG tablet Take 1 tablet by mouth daily. 07/04/19   Cheral Marker, CNM  Norethindrone-Ethinyl Estradiol-Fe Biphas (LO LOESTRIN FE) 1 MG-10 MCG / 10 MCG tablet Take 1 tablet by mouth daily. 06/02/19   Cresenzo-Dishmon, Scarlette Calico, CNM  omeprazole (PRILOSEC) 20 MG capsule Take 1 capsule (20 mg total) by mouth daily. 10/04/20   Wurst,  [place], PA-C  ondansetron (ZOFRAN) 4 MG tablet Take 1 tablet (4 mg total) by mouth every 6 (six) hours. 10/04/20   Wurst, [place], PA-C  prenatal vitamin w/FE, FA (PRENATAL 1 + 1) 27-1 MG TABS tablet Take 1 tablet by mouth daily at 12 noon. 09/13/18   Adline Potter, NP  sertraline (ZOLOFT) 50 MG tablet Take 1 tablet (50 mg total) by mouth daily. 05/03/20   Cresenzo-Dishmon, Scarlette Calico, CNM  enalapril (VASOTEC) 5 MG tablet Take 1 tablet (5 mg total) by mouth daily. Patient not taking: Reported on 06/02/2019 05/05/19 11/02/19  Jacklyn Shell, CNM    Allergies    Patient has no known allergies.  Review of Systems   Review of Systems  Cardiovascular:  Positive for leg swelling.  Musculoskeletal:  Positive for back pain.  Skin:  Positive for color change (bruise, left thigh).  All other systems reviewed and are negative.  Physical Exam Updated Vital Signs BP (!) 143/101 (BP Location: Right Arm)    Pulse 84   Temp 98.2 F (36.8 C) (Oral)   Resp 18   Ht 5\' 9"  (1.753 m)   Wt 104 kg   SpO2 98%   BMI 33.86 kg/m   Physical Exam Vitals and nursing note reviewed.  Constitutional:      General: She is not in acute distress.    Appearance: Normal appearance. She is well-developed.  HENT:     Head: Normocephalic and atraumatic.     Right Ear: Hearing normal.     Left Ear: Hearing normal.     Nose: Nose normal.  Eyes:     Conjunctiva/sclera: Conjunctivae normal.     Pupils: Pupils are equal, round, and reactive to light.  Cardiovascular:     Rate and Rhythm: Regular rhythm.     Pulses:          Dorsalis pedis pulses are 2+ on the right side and 2+ on the left side.     Heart sounds: S1 normal and S2 normal. No murmur heard.   No friction rub. No gallop.  Pulmonary:     Effort: Pulmonary effort is normal. No respiratory distress.     Breath sounds: Normal breath sounds.  Chest:     Chest wall: No tenderness.  Abdominal:     General: Bowel sounds are normal.     Palpations: Abdomen is soft.     Tenderness: There is no abdominal tenderness. There is no guarding or rebound. Negative signs include Murphy's sign and McBurney's sign.     Hernia: No hernia is present.  Musculoskeletal:        General: Normal range of motion.     Cervical back: Normal range of motion and neck supple.  Skin:    General: Skin is warm and dry.     Findings: Bruising (2cm bruise left thigh) present. No rash.  Neurological:     Mental Status: She is alert and oriented to person, place, and time.     GCS: GCS eye subscore is 4. GCS verbal subscore is 5. GCS motor subscore is 6.     Cranial Nerves: No cranial nerve deficit.     Sensory: No sensory deficit.     Coordination: Coordination normal.  Psychiatric:        Speech: Speech normal.        Behavior: Behavior normal.        Thought Content: Thought content normal.    ED Results / Procedures / Treatments   Labs (  all labs ordered are listed,  but only abnormal results are displayed) Labs Reviewed  CBC - Abnormal; Notable for the following components:      Result Value   WBC 10.9 (*)    Hemoglobin 11.9 (*)    Platelets 412 (*)    All other components within normal limits  BASIC METABOLIC PANEL - Abnormal; Notable for the following components:   Glucose, Bld 110 (*)    All other components within normal limits  D-DIMER, QUANTITATIVE    EKG None  Radiology No results found.  Procedures Procedures   Medications Ordered in ED Medications - No data to display  ED Course  I have reviewed the triage vital signs and the nursing notes.  Pertinent labs & imaging results that were available during my care of the patient were reviewed by me and considered in my medical decision making (see chart for details).    MDM Rules/Calculators/A&P                           Patient presents with multiple complaints.  Patient was recently (3 weeks ago) diagnosed with COVID.  Since then she has been having intermittent pain in the midthoracic region.  This occurs with some movements but also happens when she takes a deep breath.  It has not been persistent.  No cough or shortness of breath.  Patient also noticing some swelling of her right calf area.  Examination, however, does not show any significant swelling compared to the left side.  Negative Homans' sign.  She is concerned about a very small bruise she has on her thigh.  No thrombocytopenia, CBC is normal.  D-dimer is also normal, adequate to rule out PE, DVT.  Patient reassured, no further work-up necessary.  Final Clinical Impression(s) / ED Diagnoses Final diagnoses:  Thoracic myofascial strain, initial encounter  Right calf pain    Rx / DC Orders ED Discharge Orders          Ordered    ibuprofen (ADVIL) 800 MG tablet  Every 6 hours PRN        10/15/20 0521             Gilda Crease, MD 10/15/20 [id number]

## 2020-10-15 NOTE — ED Triage Notes (Signed)
Pt with c/o R. Lower leg swelling and numbness to toes occasionally, Pain under ribcage bilaterally, pain in middle of back, and bruising "without any reason", pain to shoulders since being diagnosed with Covid 3 weeks ago.

## 2020-10-16 ENCOUNTER — Ambulatory Visit (HOSPITAL_BASED_OUTPATIENT_CLINIC_OR_DEPARTMENT_OTHER): Payer: 59 | Admitting: Family Medicine

## 2020-10-25 ENCOUNTER — Encounter: Payer: Self-pay | Admitting: Emergency Medicine

## 2020-10-25 ENCOUNTER — Ambulatory Visit
Admission: EM | Admit: 2020-10-25 | Discharge: 2020-10-25 | Disposition: A | Discharge status: Home / Self Care | Payer: 59 | Attending: Emergency Medicine | Admitting: Emergency Medicine

## 2020-10-25 ENCOUNTER — Other Ambulatory Visit: Payer: Self-pay

## 2020-10-25 DIAGNOSIS — J029 Acute pharyngitis, unspecified: Secondary | ICD-10-CM

## 2020-10-25 DIAGNOSIS — H9202 Otalgia, left ear: Secondary | ICD-10-CM

## 2020-10-25 MED ORDER — ACETIC ACID 2 % OT SOLN: 4.0000 [drp] | Freq: Three times a day (TID) | OTIC | 0 refills | Status: DC

## 2020-10-25 NOTE — ED Triage Notes (Signed)
LT ear fullness, headache and sore throat.    Pt thinks she may have a sinus infection.

## 2020-10-25 NOTE — Discharge Instructions (Addendum)
Rest and drink plenty of fluids Prescribed acetic acid ear drops Take medications as directed and to completion Continue to use OTC ibuprofen and/ or tylenol as needed for pain control Follow up with PCP if symptoms persists Return here or go to the ER if you have any new or worsening symptoms

## 2020-10-25 NOTE — ED Provider Notes (Signed)
[facility] CARE CENTER   [id number] 10/25/20 Arrival Time: 1841  CC:EAR PAIN  SUBJECTIVE: History from: patient.  Anna Andrews is a 25 y.o. female who presents with of LT ear pain and fullness x few days.  Denies a precipitating event, such as swimming or wearing ear plugs.  Reports having covid recently.  Patient has tried OTC medications without relief.  Denies similar symptoms in the past.  Reports LT sided sore throat and congestion as well.   Denies fever, chills, fatigue, rhinorrhea, ear discharge, SOB, wheezing, chest pain, nausea, changes in bowel or bladder habits.    ROS: As per HPI.  All other pertinent ROS negative.     Past Medical History:  Diagnosis Date   Acid reflux    Anxiety    Eczema    Past Surgical History:  Procedure Laterality Date   NO PAST SURGERIES     No Known Allergies No current facility-administered medications on file prior to encounter.   Current Outpatient Medications on File Prior to Encounter  Medication Sig Dispense Refill   acetaminophen (TYLENOL) 325 MG tablet Take 2 tablets (650 mg total) by mouth every 4 (four) hours as needed (for pain scale < 4). 30 tablet 0   Blood Pressure Monitor MISC For regular home bp monitoring during pregnancy 1 each 0   calcium carbonate (TUMS - DOSED IN MG ELEMENTAL CALCIUM) 500 MG chewable tablet Chew 2 tablets by mouth 2 (two) times daily as needed for indigestion or heartburn.     cyclobenzaprine (FLEXERIL) 10 MG tablet Take 1 tablet (10 mg total) by mouth 2 (two) times daily as needed for muscle spasms. 20 tablet 0   hydrocortisone cream 1 % Apply 1 application topically 2 (two) times daily as needed for itching (For eczema.).     ibuprofen (ADVIL) 800 MG tablet Take 1 tablet (800 mg total) by mouth every 6 (six) hours as needed for moderate pain. 20 tablet 0   norethindrone-ethinyl estradiol (JUNEL FE 1/20) 1-20 MG-MCG tablet Take 1 tablet by mouth daily. 1 Package 11   Norethindrone-Ethinyl Estradiol-Fe  Biphas (LO LOESTRIN FE) 1 MG-10 MCG / 10 MCG tablet Take 1 tablet by mouth daily. 1 Package 11   omeprazole (PRILOSEC) 20 MG capsule Take 1 capsule (20 mg total) by mouth daily. 30 capsule 0   ondansetron (ZOFRAN) 4 MG tablet Take 1 tablet (4 mg total) by mouth every 6 (six) hours. 12 tablet 0   prenatal vitamin w/FE, FA (PRENATAL 1 + 1) 27-1 MG TABS tablet Take 1 tablet by mouth daily at 12 noon. 30 tablet 12   sertraline (ZOLOFT) 50 MG tablet Take 1 tablet (50 mg total) by mouth daily. 30 tablet 11   [DISCONTINUED] enalapril (VASOTEC) 5 MG tablet Take 1 tablet (5 mg total) by mouth daily. (Patient not taking: Reported on 06/02/2019) 30 tablet 2   Social History   Socioeconomic History   Marital status: Significant Other    Spouse name: Riley Lam   Number of children: Not on file   Years of education: Not on file   Highest education level: Professional school degree (e.g., MD, DDS, DVM, JD)  Occupational History   Not on file  Tobacco Use   Smoking status: Former    Packs/day: 0.50    Types: Cigarettes   Smokeless tobacco: Never   Tobacco comments:    occ  Vaping Use   Vaping Use: Never used  Substance and Sexual Activity   Alcohol use: No  Drug use: No   Sexual activity: Not Currently    Birth control/protection: Condom  Other Topics Concern   Not on file  Social History Narrative   Not on file   Social Determinants of Health   Financial Resource Strain: Not on file  Food Insecurity: Not on file  Transportation Needs: Not on file  Physical Activity: Not on file  Stress: Not on file  Social Connections: Not on file  Intimate Partner Violence: Not on file   Family History  Problem Relation Age of Onset   Hypertension Paternal Grandfather    CAD Paternal Grandmother    Hypertension Mother     OBJECTIVE:  Vitals:   10/25/20 1943  BP: 131/86  Pulse: 88  Resp: 18  Temp: 98.4 F (36.9 C)  TempSrc: Oral  SpO2: 99%     General appearance: alert;  well-appearing, nontoxic; speaking in full sentences and tolerating own secretions HEENT: NCAT; Ears: EACs clear, TMs pearly gray; Eyes: PERRL.  EOM grossly intact.Nose: nares patent without rhinorrhea, Throat: oropharynx clear, tonsils non erythematous or enlarged, uvula midline  Neck: supple without LAD Lungs: unlabored respirations, symmetrical air entry; cough: absent; no respiratory distress; CTAB Heart: regular rate and rhythm.  Skin: warm and dry Psychological: alert and cooperative; normal mood and affect    ASSESSMENT & PLAN:  1. Left ear pain   2. Sore throat     Meds ordered this encounter  Medications   acetic acid 2 % otic solution    Sig: Place 4 drops into the left ear 3 (three) times daily.    Dispense:  15 mL    Refill:  0    Order Specific Question:   Supervising Provider    Answer:   Eustace Moore [[id number]]    Rest and drink plenty of fluids Prescribed acetic acid ear drops Take medications as directed and to completion Continue to use OTC ibuprofen and/ or tylenol as needed for pain control Follow up with PCP if symptoms persists Return here or go to the ER if you have any new or worsening symptoms   Reviewed expectations re: course of current medical issues. Questions answered. Outlined signs and symptoms indicating need for more acute intervention. Patient verbalized understanding. After Visit Summary given.          Rennis Harding, PA-C 10/25/20 2027

## 2020-11-08 DIAGNOSIS — Z419 Encounter for procedure for purposes other than remedying health state, unspecified: Secondary | ICD-10-CM | POA: Diagnosis not present

## 2020-11-15 ENCOUNTER — Ambulatory Visit: Payer: 59 | Admitting: Adult Health

## 2020-11-21 ENCOUNTER — Ambulatory Visit
Admission: EM | Admit: 2020-11-21 | Discharge: 2020-11-21 | Disposition: A | Discharge status: Home / Self Care | Payer: 59 | Attending: Family Medicine | Admitting: Family Medicine

## 2020-11-21 ENCOUNTER — Other Ambulatory Visit: Payer: Self-pay

## 2020-11-21 ENCOUNTER — Encounter: Payer: Self-pay | Admitting: Emergency Medicine

## 2020-11-21 DIAGNOSIS — J0100 Acute maxillary sinusitis, unspecified: Secondary | ICD-10-CM

## 2020-11-21 MED ORDER — AMOXICILLIN 875 MG PO TABS: 875.0000 mg | ORAL_TABLET | Freq: Two times a day (BID) | ORAL | 0 refills | Status: AC

## 2020-11-21 NOTE — ED Provider Notes (Signed)
[facility] CARE CENTER   [id number] 11/21/20 Arrival Time: 0947  ASSESSMENT & PLAN:  1. Acute non-recurrent maxillary sinusitis    Begin: Meds ordered this encounter  Medications   amoxicillin (AMOXIL) 875 MG tablet    Sig: Take 1 tablet (875 mg total) by mouth 2 (two) times daily for 10 days.    Dispense:  20 tablet    Refill:  0   Discussed typical duration of symptoms. OTC symptom care as needed. Ensure adequate fluid intake and rest.   Follow-up Information     [place], Buren Kos, MD.   Specialty: Family Medicine Why: If worsening or failing to improve as anticipated. Contact information: [address] [place] [address] [phone]                 Reviewed expectations re: course of current medical issues. Questions answered. Outlined signs and symptoms indicating need for more acute intervention. Patient verbalized understanding. After Visit Summary given.   SUBJECTIVE: History from: patient.  Anna Andrews is a 25 y.o. female who presents with complaint of nasal congestion, post-nasal drainage, and sinus pain. Onset gradual,  1.5 w ago . Respiratory symptoms: mild cough; improving. Fever: absent. Overall normal PO intake without n/v. OTC treatment: NyQuil without much relief. History of frequent sinus infections: no. No specific aggravating or alleviating factors reported. Social History   Tobacco Use  Smoking Status Former   Packs/day: 0.50   Types: Cigarettes  Smokeless Tobacco Never  Tobacco Comments   occ    ROS: As per HPI.  OBJECTIVE:  Vitals:   11/21/20 1059  BP: 119/85  Pulse: 75  Resp: 16  Temp: 98 F (36.7 C)  TempSrc: Oral  SpO2: 97%     General appearance: alert; no distress HEENT: nasal congestion; clear runny nose; throat irritation secondary to post-nasal drainage; bilateral maxillary tenderness to palpation (L>R); turbinates boggy Neck: supple without LAD; trachea midline Lungs: unlabored respirations,  symmetrical air entry; cough: absent; no respiratory distress Skin: warm and dry Psychological: alert and cooperative; normal mood and affect  No Known Allergies  Past Medical History:  Diagnosis Date   Acid reflux    Anxiety    Eczema    Family History  Problem Relation Age of Onset   Hypertension Paternal Grandfather    CAD Paternal Grandmother    Hypertension Mother    Social History   Socioeconomic History   Marital status: Significant Other    Spouse name: Riley Lam   Number of children: Not on file   Years of education: Not on file   Highest education level: Professional school degree (e.g., MD, DDS, DVM, JD)  Occupational History   Not on file  Tobacco Use   Smoking status: Former    Packs/day: 0.50    Types: Cigarettes   Smokeless tobacco: Never   Tobacco comments:    occ  Vaping Use   Vaping Use: Never used  Substance and Sexual Activity   Alcohol use: No   Drug use: No   Sexual activity: Not Currently    Birth control/protection: Condom  Other Topics Concern   Not on file  Social History Narrative   Not on file   Social Determinants of Health   Financial Resource Strain: Not on file  Food Insecurity: Not on file  Transportation Needs: Not on file  Physical Activity: Not on file  Stress: Not on file  Social Connections: Not on file  Intimate Partner Violence: Not on file  Mardella Layman, MD 11/21/20 1126

## 2020-11-21 NOTE — ED Triage Notes (Signed)
Nasal and chest congestion, productive cough with green sputum x 1.5 weeks.  Left ear feel clogged.  At home covid test was negative x 2.  History of covid 1 month ago

## 2020-11-27 ENCOUNTER — Ambulatory Visit: Payer: 59 | Admitting: Adult Health

## 2020-12-08 DIAGNOSIS — Z419 Encounter for procedure for purposes other than remedying health state, unspecified: Secondary | ICD-10-CM | POA: Diagnosis not present

## 2020-12-11 ENCOUNTER — Ambulatory Visit: Payer: 59 | Admitting: Adult Health

## 2020-12-15 ENCOUNTER — Encounter: Payer: Self-pay | Admitting: Emergency Medicine

## 2020-12-15 ENCOUNTER — Ambulatory Visit
Admission: EM | Admit: 2020-12-15 | Discharge: 2020-12-15 | Disposition: A | Discharge status: Home / Self Care | Payer: 59 | Attending: Family Medicine | Admitting: Family Medicine

## 2020-12-15 ENCOUNTER — Ambulatory Visit (INDEPENDENT_AMBULATORY_CARE_PROVIDER_SITE_OTHER): Payer: 59

## 2020-12-15 DIAGNOSIS — R059 Cough, unspecified: Secondary | ICD-10-CM

## 2020-12-15 DIAGNOSIS — R0981 Nasal congestion: Secondary | ICD-10-CM

## 2020-12-15 MED ORDER — PREDNISONE 20 MG PO TABS: 40.0000 mg | ORAL_TABLET | Freq: Every day | ORAL | 0 refills | Status: DC

## 2020-12-15 NOTE — ED Provider Notes (Signed)
[facility] CARE CENTER   [id number] 12/15/20 Arrival Time: 1005  ASSESSMENT & PLAN:  1. Nasal congestion    I have personally viewed the imaging studies ordered this visit. Normal CXR.  Discussed typical duration of viral illnesses.  Ques allergy component. OTC symptom care as needed. Would like trial of: Meds ordered this encounter  Medications   predniSONE (DELTASONE) 20 MG tablet    Sig: Take 2 tablets (40 mg total) by mouth daily.    Dispense:  10 tablet    Refill:  0     Follow-up Information     [place], Buren Kos, MD.   Specialty: Family Medicine Why: As needed. Contact information: [address] [place] [address] [phone]                 Reviewed expectations re: course of current medical issues. Questions answered. Outlined signs and symptoms indicating need for more acute intervention. Understanding verbalized. After Visit Summary given.   SUBJECTIVE: History from: patient. Anna Andrews is a 25 y.o. female who reports chest congestion; x sev weeks; treated for sinus infection mid Sept; improved; lingering coughing that is dry. Denies: fever and difficulty breathing. Normal PO intake without n/v/d.   OBJECTIVE:  Vitals:   12/15/20 1017  BP: 132/63  Pulse: 90  Resp: 16  Temp: 98 F (36.7 C)  TempSrc: Oral  SpO2: 98%    General appearance: alert; no distress Eyes: PERRLA; EOMI; conjunctiva normal HENT: [place]; AT; with nasal congestion Neck: supple  Lungs: speaks full sentences without difficulty; unlabored Extremities: no edema Skin: warm and dry Neurologic: normal gait Psychological: alert and cooperative; normal mood and affect    Imaging: DG Chest 2 View  Result Date: 12/15/2020 CLINICAL DATA:  [age]-year-old female with cough. Chest congestion and ear pain for 2 weeks. EXAM: CHEST - 2 VIEW COMPARISON:  10/05/2020 and earlier. FINDINGS: Lung volumes and mediastinal contours remain normal. Visualized tracheal  air column is within normal limits. Both lungs remain clear. No pneumothorax or pleural effusion. No osseous abnormality identified. Negative visible bowel gas. IMPRESSION: Negative.  No cardiopulmonary abnormality. Electronically Signed   By: Odessa Fleming M.D.   On: 12/15/2020 10:40    No Known Allergies  Past Medical History:  Diagnosis Date   Acid reflux    Anxiety    Eczema    Social History   Socioeconomic History   Marital status: Significant Other    Spouse name: Riley Lam   Number of children: Not on file   Years of education: Not on file   Highest education level: Professional school degree (e.g., MD, DDS, DVM, JD)  Occupational History   Not on file  Tobacco Use   Smoking status: Former    Packs/day: 0.50    Types: Cigarettes   Smokeless tobacco: Never   Tobacco comments:    occ  Vaping Use   Vaping Use: Never used  Substance and Sexual Activity   Alcohol use: No   Drug use: No   Sexual activity: Not Currently    Birth control/protection: Condom  Other Topics Concern   Not on file  Social History Narrative   Not on file   Social Determinants of Health   Financial Resource Strain: Not on file  Food Insecurity: Not on file  Transportation Needs: Not on file  Physical Activity: Not on file  Stress: Not on file  Social Connections: Not on file  Intimate Partner Violence: Not on file   Family History  Problem Relation Age of Onset   Hypertension Paternal Grandfather    CAD Paternal Grandmother    Hypertension Mother    Past Surgical History:  Procedure Laterality Date   NO PAST SURGERIES       Mardella Layman, MD 12/15/20 1245

## 2020-12-15 NOTE — ED Triage Notes (Signed)
Pt presents today with continued complaints of chest congestion, productive cough and bilateral ear pain x 2 weeks. She recently completed a course of antibiotics.

## 2021-01-08 DIAGNOSIS — Z419 Encounter for procedure for purposes other than remedying health state, unspecified: Secondary | ICD-10-CM | POA: Diagnosis not present

## 2021-01-09 ENCOUNTER — Ambulatory Visit
Admission: EM | Admit: 2021-01-09 | Discharge: 2021-01-09 | Disposition: A | Discharge status: Home / Self Care | Payer: 59 | Attending: Urgent Care | Admitting: Urgent Care

## 2021-01-09 ENCOUNTER — Encounter: Payer: Self-pay | Admitting: Emergency Medicine

## 2021-01-09 ENCOUNTER — Other Ambulatory Visit: Payer: Self-pay

## 2021-01-09 DIAGNOSIS — J069 Acute upper respiratory infection, unspecified: Secondary | ICD-10-CM

## 2021-01-09 DIAGNOSIS — H938X3 Other specified disorders of ear, bilateral: Secondary | ICD-10-CM

## 2021-01-09 DIAGNOSIS — Z20822 Contact with and (suspected) exposure to covid-19: Secondary | ICD-10-CM

## 2021-01-09 MED ORDER — PSEUDOEPHEDRINE HCL 60 MG PO TABS: 60.0000 mg | ORAL_TABLET | Freq: Three times a day (TID) | ORAL | 0 refills | Status: DC | PRN

## 2021-01-09 MED ORDER — BENZONATATE 100 MG PO CAPS: 100.0000 mg | ORAL_CAPSULE | Freq: Three times a day (TID) | ORAL | 0 refills | Status: DC | PRN

## 2021-01-09 MED ORDER — CETIRIZINE HCL 10 MG PO TABS: 10.0000 mg | ORAL_TABLET | Freq: Every day | ORAL | 0 refills | Status: DC

## 2021-01-09 MED ORDER — PROMETHAZINE-DM 6.25-15 MG/5ML PO SYRP: 5.0000 mL | ORAL_SOLUTION | Freq: Every evening | ORAL | 0 refills | Status: DC | PRN

## 2021-01-09 NOTE — ED Triage Notes (Signed)
Exposure to flu last week.   Cough and stuffy ears x 2 days

## 2021-01-09 NOTE — Discharge Instructions (Addendum)

## 2021-01-09 NOTE — ED Provider Notes (Signed)
[facility]   MRN: [id number] DOB: 12-May-1995  Subjective:   Anna Andrews is a 25 y.o. female presenting for exposure to influenza last week.  Has been having intermittent stuffy ears, coughing mostly at night.  The cough can sometimes make her chest or back hurt.  Wants to make sure she does not have pneumonia.  No body aches, fevers, throat pain, painful swallowing.  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  No current facility-administered medications for this encounter.  Current Outpatient Medications:    acetaminophen (TYLENOL) 325 MG tablet, Take 2 tablets (650 mg total) by mouth every 4 (four) hours as needed (for pain scale < 4)., Disp: 30 tablet, Rfl: 0   acetic acid 2 % otic solution, Place 4 drops into the left ear 3 (three) times daily., Disp: 15 mL, Rfl: 0   Blood Pressure Monitor MISC, For regular home bp monitoring during pregnancy, Disp: 1 each, Rfl: 0   calcium carbonate (TUMS - DOSED IN MG ELEMENTAL CALCIUM) 500 MG chewable tablet, Chew 2 tablets by mouth 2 (two) times daily as needed for indigestion or heartburn., Disp: , Rfl:    cyclobenzaprine (FLEXERIL) 10 MG tablet, Take 1 tablet (10 mg total) by mouth 2 (two) times daily as needed for muscle spasms., Disp: 20 tablet, Rfl: 0   hydrocortisone cream 1 %, Apply 1 application topically 2 (two) times daily as needed for itching (For eczema.)., Disp: , Rfl:    ibuprofen (ADVIL) 800 MG tablet, Take 1 tablet (800 mg total) by mouth every 6 (six) hours as needed for moderate pain., Disp: 20 tablet, Rfl: 0   norethindrone-ethinyl estradiol (JUNEL FE 1/20) 1-20 MG-MCG tablet, Take 1 tablet by mouth daily., Disp: 1 Package, Rfl: 11   Norethindrone-Ethinyl Estradiol-Fe Biphas (LO LOESTRIN FE) 1 MG-10 MCG / 10 MCG tablet, Take 1 tablet by mouth daily., Disp: 1 Package, Rfl: 11   omeprazole (PRILOSEC) 20 MG capsule, Take 1 capsule (20  mg total) by mouth daily., Disp: 30 capsule, Rfl: 0   ondansetron (ZOFRAN) 4 MG tablet, Take 1 tablet (4 mg total) by mouth every 6 (six) hours., Disp: 12 tablet, Rfl: 0   predniSONE (DELTASONE) 20 MG tablet, Take 2 tablets (40 mg total) by mouth daily., Disp: 10 tablet, Rfl: 0   prenatal vitamin w/FE, FA (PRENATAL 1 + 1) 27-1 MG TABS tablet, Take 1 tablet by mouth daily at 12 noon., Disp: 30 tablet, Rfl: 12   sertraline (ZOLOFT) 50 MG tablet, Take 1 tablet (50 mg total) by mouth daily., Disp: 30 tablet, Rfl: 11   No Known Allergies  Past Medical History:  Diagnosis Date   Acid reflux    Anxiety    Eczema      Past Surgical History:  Procedure Laterality Date   NO PAST SURGERIES      Family History  Problem Relation Age of Onset   Hypertension Paternal Grandfather    CAD Paternal Grandmother    Hypertension Mother     Social History   Tobacco Use   Smoking status: Former    Packs/day: 0.50    Types: Cigarettes   Smokeless tobacco: Never   Tobacco comments:    occ  Vaping Use   Vaping Use: Never used  Substance Use Topics   Alcohol use: No   Drug use: No    ROS   Objective:   Vitals:  BP 137/83 (BP Location: Right Arm)   Pulse 83   Temp 98.7 F (37.1 C) (Oral)   Resp 18   LMP 01/01/2021 (Exact Date)   SpO2 97%   Physical Exam Constitutional:      General: She is not in acute distress.    Appearance: Normal appearance. She is well-developed. She is not ill-appearing, toxic-appearing or diaphoretic.  HENT:     Head: Normocephalic and atraumatic.     Right Ear: Tympanic membrane, ear canal and external ear normal. No drainage or tenderness. No middle ear effusion. Tympanic membrane is not erythematous.     Left Ear: Tympanic membrane, ear canal and external ear normal. No drainage or tenderness.  No middle ear effusion. Tympanic membrane is not erythematous.     Nose: Nose normal. No congestion or rhinorrhea.     Mouth/Throat:     Mouth: Mucous membranes  are moist. No oral lesions.     Pharynx: Oropharynx is clear. No pharyngeal swelling, oropharyngeal exudate, posterior oropharyngeal erythema or uvula swelling.     Tonsils: No tonsillar exudate or tonsillar abscesses.  Eyes:     General: No scleral icterus.       Right eye: No discharge.        Left eye: No discharge.     Extraocular Movements: Extraocular movements intact.     Right eye: Normal extraocular motion.     Left eye: Normal extraocular motion.     Conjunctiva/sclera: Conjunctivae normal.     Pupils: Pupils are equal, round, and reactive to light.  Cardiovascular:     Rate and Rhythm: Normal rate and regular rhythm.     Pulses: Normal pulses.     Heart sounds: Normal heart sounds. No murmur heard.   No friction rub. No gallop.  Pulmonary:     Effort: Pulmonary effort is normal. No respiratory distress.     Breath sounds: Normal breath sounds. No stridor. No wheezing, rhonchi or rales.  Musculoskeletal:     Cervical back: Normal range of motion and neck supple.  Lymphadenopathy:     Cervical: No cervical adenopathy.  Skin:    General: Skin is warm and dry.     Findings: No rash.  Neurological:     General: No focal deficit present.     Mental Status: She is alert and oriented to person, place, and time.  Psychiatric:        Mood and Affect: Mood normal.        Behavior: Behavior normal.        Thought Content: Thought content normal.        Judgment: Judgment normal.    Assessment and Plan :   PDMP not reviewed this encounter.  1. Viral URI with cough   2. Exposure to COVID-19 virus   3. Ear fullness, bilateral    Deferred imaging given clear cardiopulmonary exam, hemodynamically stable vital signs. COVID and flu test pending.  We will otherwise manage for viral upper respiratory infection.  Physical exam findings reassuring and vital signs stable for discharge. Advised supportive care, offered symptomatic relief. Counseled patient on potential for adverse  effects with medications prescribed/recommended today, ER and return-to-clinic precautions discussed, patient verbalized understanding.      Wallis Bamberg, PA-C 01/09/21 1414

## 2021-01-10 LAB — COVID-19, FLU A+B AND RSV
Influenza A, NAA: NOT DETECTED
Influenza B, NAA: NOT DETECTED
RSV, NAA: NOT DETECTED
SARS-CoV-2, NAA: NOT DETECTED

## 2021-01-17 ENCOUNTER — Other Ambulatory Visit: Payer: Self-pay

## 2021-01-17 ENCOUNTER — Emergency Department (HOSPITAL_COMMUNITY)
Admission: EM | Admit: 2021-01-17 | Discharge: 2021-01-17 | Disposition: A | Discharge status: Home / Self Care | Payer: 59 | Attending: Emergency Medicine | Admitting: Emergency Medicine

## 2021-01-17 ENCOUNTER — Encounter: Payer: Self-pay | Admitting: Emergency Medicine

## 2021-01-17 ENCOUNTER — Encounter (HOSPITAL_COMMUNITY): Payer: Self-pay | Admitting: *Deleted

## 2021-01-17 ENCOUNTER — Ambulatory Visit
Admission: EM | Admit: 2021-01-17 | Discharge: 2021-01-17 | Disposition: A | Discharge status: Home / Self Care | Payer: 59 | Attending: Family Medicine | Admitting: Family Medicine

## 2021-01-17 ENCOUNTER — Emergency Department (HOSPITAL_COMMUNITY): Payer: 59

## 2021-01-17 DIAGNOSIS — M545 Low back pain, unspecified: Secondary | ICD-10-CM | POA: Diagnosis not present

## 2021-01-17 DIAGNOSIS — R109 Unspecified abdominal pain: Secondary | ICD-10-CM

## 2021-01-17 DIAGNOSIS — J3489 Other specified disorders of nose and nasal sinuses: Secondary | ICD-10-CM | POA: Diagnosis not present

## 2021-01-17 DIAGNOSIS — Z79899 Other long term (current) drug therapy: Secondary | ICD-10-CM | POA: Insufficient documentation

## 2021-01-17 DIAGNOSIS — R7309 Other abnormal glucose: Secondary | ICD-10-CM | POA: Diagnosis not present

## 2021-01-17 DIAGNOSIS — D72829 Elevated white blood cell count, unspecified: Secondary | ICD-10-CM | POA: Diagnosis not present

## 2021-01-17 DIAGNOSIS — N3000 Acute cystitis without hematuria: Secondary | ICD-10-CM | POA: Diagnosis not present

## 2021-01-17 DIAGNOSIS — R0789 Other chest pain: Secondary | ICD-10-CM | POA: Insufficient documentation

## 2021-01-17 DIAGNOSIS — R051 Acute cough: Secondary | ICD-10-CM

## 2021-01-17 DIAGNOSIS — Z87891 Personal history of nicotine dependence: Secondary | ICD-10-CM | POA: Diagnosis not present

## 2021-01-17 DIAGNOSIS — K5900 Constipation, unspecified: Secondary | ICD-10-CM | POA: Diagnosis not present

## 2021-01-17 LAB — POC URINE PREG, ED: Preg Test, Ur: NEGATIVE

## 2021-01-17 MED ORDER — NAPROXEN 500 MG PO TABS: 500.0000 mg | ORAL_TABLET | Freq: Two times a day (BID) | ORAL | 0 refills | Status: DC

## 2021-01-17 MED ORDER — AZITHROMYCIN 250 MG PO TABS: 250.0000 mg | ORAL_TABLET | Freq: Every day | ORAL | 0 refills | Status: DC

## 2021-01-17 NOTE — ED Triage Notes (Signed)
Seen this am at urgent care for same symptoms, concerned she may have pneumonia. Pain in upper chest area

## 2021-01-17 NOTE — Discharge Instructions (Signed)
Please follow-up with your doctor, your x-ray was totally normal, you may take ibuprofen up to 600 mg 3 times a day or alternatively I have prescribed Naprosyn  Emergency department for worsening symptoms

## 2021-01-17 NOTE — ED Notes (Signed)
Pt not able to void at this time.

## 2021-01-17 NOTE — ED Notes (Signed)
Began speaking to pt about discharge planning, pt requests having blood work and UA performed prior to leaving, pt concerns shared with MD

## 2021-01-17 NOTE — ED Triage Notes (Signed)
Patient c/o back pain , nonproductive cough, and sinus pressure x 10 days.   Patient denies fever.   Patient states she was seen at this clinic last week and tested for multiple "things with negative results".   Patient endorses fatigue.   Patient has taken Zyrtec with no relief of symptoms.

## 2021-01-17 NOTE — ED Provider Notes (Signed)
[facility] EMERGENCY DEPARTMENT Provider Note   CSN: [id number] Arrival date & time: 01/17/21  1428     History No chief complaint on file.   Anna Andrews is a 25 y.o. female.  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  Sometimes gets worse when she lays down or when she moves her left side, it is not associated with a rash there is no coughing no shortness of breath no fevers or chills no nausea or vomiting.  She has been to the urgent care this morning where she was given Zithromax, she states that she took it and it made her sleepy so she decided to come here for further evaluation.  The patient denies any other symptoms whatsoever and specifically denies having any upper respiratory symptoms, sinus symptoms or coughing.  Again she denies having any cough  Past Medical History:  Diagnosis Date   Acid reflux    Anxiety    Eczema     Patient Active Problem List   Diagnosis Date Noted   Gestational hypertension 04/27/2019   Supervision of normal first pregnancy 10/19/2018   Anxiety 10/19/2018    Past Surgical History:  Procedure Laterality Date   NO PAST SURGERIES       OB History     Gravida  1   Para  1   Term  1   Preterm  0   AB  0   Living  1      SAB  0   IAB  0   Ectopic  0   Multiple  0   Live Births  1           Family History  Problem Relation Age of Onset   Hypertension Paternal Grandfather    CAD Paternal Grandmother    Hypertension Mother     Social History   Tobacco Use   Smoking status: Former    Packs/day: 0.50    Types: Cigarettes   Smokeless tobacco: Never   Tobacco comments:    occ  Vaping Use   Vaping Use: Never used   Substance Use Topics   Alcohol use: No   Drug use: No    Home Medications Prior to Admission medications   Medication Sig Start Date End Date Taking? Authorizing Provider  naproxen (NAPROSYN) 500 MG tablet Take 1 tablet (500 mg total) by mouth 2 (two) times daily with a meal. 01/17/21  Yes Eber Hong, MD  acetaminophen (TYLENOL) 325 MG tablet Take 2 tablets (650 mg total) by mouth every 4 (four) hours as needed (for pain scale < 4). 04/30/19   Fair, Hoyle Sauer, MD  acetic acid 2 % otic solution Place 4 drops into the left ear 3 (three) times daily. 10/25/20   Wurst, [place], PA-C  azithromycin (ZITHROMAX) 250 MG tablet Take 1 tablet (250 mg total) by mouth daily. Take first 2 tablets together, then 1 every day until finished. 01/17/21   Mardella Layman, MD  benzonatate (TESSALON) 100 MG capsule Take 1-2 capsules (100-200 mg total) by mouth 3 (three) times daily as needed for cough. 01/09/21   Wallis Bamberg, PA-C  Blood Pressure Monitor MISC For regular home bp  monitoring during pregnancy 10/19/18   Cheral Marker, CNM  calcium carbonate (TUMS - DOSED IN MG ELEMENTAL CALCIUM) 500 MG chewable tablet Chew 2 tablets by mouth 2 (two) times daily as needed for indigestion or heartburn.    [provider]  cetirizine (ZYRTEC ALLERGY) 10 MG tablet Take 1 tablet (10 mg total) by mouth daily. 01/09/21   Wallis Bamberg, PA-C  cyclobenzaprine (FLEXERIL) 10 MG tablet Take 1 tablet (10 mg total) by mouth 2 (two) times daily as needed for muscle spasms. 09/13/20   Wurst, [place], PA-C  hydrocortisone cream 1 % Apply 1 application topically 2 (two) times daily as needed for itching (For eczema.).    [provider]  ibuprofen (ADVIL) 800 MG tablet Take 1 tablet (800 mg total) by mouth every 6 (six) hours as needed for moderate pain. 10/15/20   Gilda Crease, MD  norethindrone-ethinyl estradiol (JUNEL FE 1/20) 1-20 MG-MCG tablet Take 1 tablet by mouth daily. 07/04/19   Cheral Marker, CNM   Norethindrone-Ethinyl Estradiol-Fe Biphas (LO LOESTRIN FE) 1 MG-10 MCG / 10 MCG tablet Take 1 tablet by mouth daily. 06/02/19   Cresenzo-Dishmon, Scarlette Calico, CNM  omeprazole (PRILOSEC) 20 MG capsule Take 1 capsule (20 mg total) by mouth daily. 10/04/20   Wurst, [place], PA-C  ondansetron (ZOFRAN) 4 MG tablet Take 1 tablet (4 mg total) by mouth every 6 (six) hours. 10/04/20   Wurst, [place], PA-C  predniSONE (DELTASONE) 20 MG tablet Take 2 tablets (40 mg total) by mouth daily. 12/15/20   Mardella Layman, MD  prenatal vitamin w/FE, FA (PRENATAL 1 + 1) 27-1 MG TABS tablet Take 1 tablet by mouth daily at 12 noon. 09/13/18   Adline Potter, NP  promethazine-dextromethorphan (PROMETHAZINE-DM) 6.25-15 MG/5ML syrup Take 5 mLs by mouth at bedtime as needed for cough. 01/09/21   Wallis Bamberg, PA-C  pseudoephedrine (SUDAFED) 60 MG tablet Take 1 tablet (60 mg total) by mouth every 8 (eight) hours as needed for congestion. 01/09/21   Wallis Bamberg, PA-C  sertraline (ZOLOFT) 50 MG tablet Take 1 tablet (50 mg total) by mouth daily. 05/03/20   Cresenzo-Dishmon, Scarlette Calico, CNM  enalapril (VASOTEC) 5 MG tablet Take 1 tablet (5 mg total) by mouth daily. Patient not taking: Reported on 06/02/2019 05/05/19 11/02/19  Jacklyn Shell, CNM    Allergies    Patient has no known allergies.  Review of Systems   Review of Systems  All other systems reviewed and are negative.  Physical Exam Updated Vital Signs BP 124/88   Pulse 90   Temp 98.2 F (36.8 C) (Oral)   Resp 16   LMP 01/09/2021 (Approximate)   SpO2 100%   Physical Exam Vitals and nursing note reviewed.  Constitutional:      General: She is not in acute distress.    Appearance: She is well-developed.  HENT:     Head: Normocephalic and atraumatic.     Nose: Nose normal. No congestion or rhinorrhea.     Mouth/Throat:     Mouth: Mucous membranes are moist.     Pharynx: No oropharyngeal exudate or posterior oropharyngeal erythema.  Eyes:     General: No  scleral icterus.       Right eye: No discharge.        Left eye: No discharge.     Conjunctiva/sclera: Conjunctivae normal.     Pupils: Pupils are equal, round, and reactive to light.  Neck:     Thyroid: No thyromegaly.     Vascular: No  JVD.  Cardiovascular:     Rate and Rhythm: Normal rate and regular rhythm.     Heart sounds: Normal heart sounds. No murmur heard.   No friction rub. No gallop.  Pulmonary:     Effort: Pulmonary effort is normal. No respiratory distress.     Breath sounds: Normal breath sounds. No wheezing or rales.  Abdominal:     General: Bowel sounds are normal. There is no distension.     Palpations: Abdomen is soft. There is no mass.     Tenderness: There is no abdominal tenderness.     Comments: No abdominal tenderness to palpation  Musculoskeletal:        General: No tenderness. Normal range of motion.     Cervical back: Normal range of motion and neck supple.  Lymphadenopathy:     Cervical: No cervical adenopathy.  Skin:    General: Skin is warm and dry.     Findings: No erythema or rash.     Comments: No rash on the left chest inframammary side or posterior thorax.  Neurological:     Mental Status: She is alert.     Coordination: Coordination normal.  Psychiatric:        Behavior: Behavior normal.    ED Results / Procedures / Treatments   Labs (all labs ordered are listed, but only abnormal results are displayed) Labs Reviewed  POC URINE PREG, ED    EKG EKG Interpretation  Date/Time:  Thursday January 17 2021 15:04:00 EST Ventricular Rate:  92 PR Interval:  162 QRS Duration: 88 QT Interval:  372 QTC Calculation: 460 R Axis:   79 Text Interpretation: Normal sinus rhythm Normal ECG Confirmed by Eber Hong ([id number]) on 01/17/2021 3:30:15 PM  Radiology DG Chest 2 View  Result Date: 01/17/2021 CLINICAL DATA:  Chest pain EXAM: CHEST - 2 VIEW COMPARISON:  Chest radiograph dated December 15, 2020 FINDINGS: The heart size and mediastinal  contours are within normal limits. Both lungs are clear. The visualized skeletal structures are unremarkable. IMPRESSION: No active cardiopulmonary disease. Electronically Signed   By: Larose Hires D.O.   On: 01/17/2021 15:50    Procedures Procedures   Medications Ordered in ED Medications - No data to display  ED Course  I have reviewed the triage vital signs and the nursing notes.  Pertinent labs & imaging results that were available during my care of the patient were reviewed by me and considered in my medical decision making (see chart for details).    MDM Rules/Calculators/A&P                           Strangely this patient is now stating that she has no cough whatsoever, her exam is totally unremarkable, she is able to make her side hurts when she leans away from that side to the right, there is no signs of zoster, agreed to do a chest x-ray to make sure there is no underlying pathology seen however her vital signs reflect only minimal hypertension no fever or tachycardia and no hypoxia.  The EKG has 92 bpm with normal axis intervals and ST segments, there is no signs of ischemia and this is a normal EKG.  Xray negative  Final Clinical Impression(s) / ED Diagnoses Final diagnoses:  Side pain    Rx / DC Orders ED Discharge Orders          Ordered    naproxen (NAPROSYN) 500 MG tablet  2  times daily with meals        01/17/21 1617             Eber Hong, MD 01/17/21 [phone]

## 2021-01-17 NOTE — ED Provider Notes (Signed)
[facility]   [id number] 01/17/21 Arrival Time: 0807  ASSESSMENT & PLAN:  1. Sinus pressure   2. Acute cough    Given duration will tx with: Meds ordered this encounter  Medications   azithromycin (ZITHROMAX) 250 MG tablet    Sig: Take 1 tablet (250 mg total) by mouth daily. Take first 2 tablets together, then 1 every day until finished.    Dispense:  6 tablet    Refill:  0   OTC care as needed.   Follow-up Information     [place], Buren Kos, MD.   Specialty: Family Medicine Why: As needed. Contact information: [address] [place] [address] [phone]                 Reviewed expectations re: course of current medical issues. Questions answered. Outlined signs and symptoms indicating need for more acute intervention. Understanding verbalized. After Visit Summary given.   SUBJECTIVE: History from: patient. Anna Andrews is a 25 y.o. female who reports: persistent sinus pressure and cough; > 10 days. Denies: fever. Normal PO intake without n/v/d.  Social History   Tobacco Use  Smoking Status Former   Packs/day: 0.50   Types: Cigarettes  Smokeless Tobacco Never  Tobacco Comments   occ     OBJECTIVE:  Vitals:   01/17/21 0815  BP: (!) 135/94  Pulse: 97  Resp: 16  Temp: 98.1 F (36.7 C)  TempSrc: Oral  SpO2: 97%    General appearance: alert; no distress Eyes: PERRLA; EOMI; conjunctiva normal HENT: [place]; AT; with nasal congestion Neck: supple  Lungs: speaks full sentences without difficulty; unlabored; slightly coarse breath sounds bilaterally Extremities: no edema Skin: warm and dry Neurologic: normal gait Psychological: alert and cooperative; normal mood and affect  Labs: Results for orders placed or performed during the hospital encounter of 01/09/21  COVID-19, Flu A+B and RSV ([facility])   Specimen: Nasal Swab  Result Value Ref Range   SARS-CoV-2, NAA Not Detected Not Detected   Influenza A, NAA Not Detected  Not Detected   Influenza B, NAA Not Detected Not Detected   RSV, NAA Not Detected Not Detected   Test Information: Comment      No Known Allergies  Past Medical History:  Diagnosis Date   Acid reflux    Anxiety    Eczema    Social History   Socioeconomic History   Marital status: Significant Other    Spouse name: Anna Andrews   Number of children: Not on file   Years of education: Not on file   Highest education level: Professional school degree (e.g., MD, DDS, DVM, JD)  Occupational History   Not on file  Tobacco Use   Smoking status: Former    Packs/day: 0.50    Types: Cigarettes   Smokeless tobacco: Never   Tobacco comments:    occ  Vaping Use   Vaping Use: Never used  Substance and Sexual Activity   Alcohol use: No   Drug use: No   Sexual activity: Not Currently    Birth control/protection: Condom  Other Topics Concern   Not on file  Social History Narrative   Not on file   Social Determinants of Health   Financial Resource Strain: Not on file  Food Insecurity: Not on file  Transportation Needs: Not on file  Physical Activity: Not on file  Stress: Not on file  Social Connections: Not on file  Intimate Partner Violence: Not on file   Family History  Problem Relation Age of Onset   Hypertension Paternal Grandfather    CAD Paternal Grandmother    Hypertension Mother    Past Surgical History:  Procedure Laterality Date   NO PAST SURGERIES       Mardella Layman, MD 01/17/21 [phone]

## 2021-01-18 ENCOUNTER — Emergency Department (HOSPITAL_COMMUNITY)
Admission: EM | Admit: 2021-01-18 | Discharge: 2021-01-18 | Disposition: A | Discharge status: Home / Self Care | Payer: 59 | Attending: Emergency Medicine | Admitting: Emergency Medicine

## 2021-01-18 ENCOUNTER — Other Ambulatory Visit: Payer: Self-pay

## 2021-01-18 ENCOUNTER — Encounter (HOSPITAL_COMMUNITY): Payer: Self-pay | Admitting: *Deleted

## 2021-01-18 DIAGNOSIS — M5459 Other low back pain: Secondary | ICD-10-CM | POA: Diagnosis not present

## 2021-01-18 DIAGNOSIS — Z87891 Personal history of nicotine dependence: Secondary | ICD-10-CM | POA: Diagnosis not present

## 2021-01-18 DIAGNOSIS — M545 Low back pain, unspecified: Secondary | ICD-10-CM | POA: Insufficient documentation

## 2021-01-18 DIAGNOSIS — R7309 Other abnormal glucose: Secondary | ICD-10-CM | POA: Insufficient documentation

## 2021-01-18 DIAGNOSIS — N3000 Acute cystitis without hematuria: Secondary | ICD-10-CM

## 2021-01-18 DIAGNOSIS — D72829 Elevated white blood cell count, unspecified: Secondary | ICD-10-CM | POA: Diagnosis not present

## 2021-01-18 LAB — I-STAT CHEM 8, ED
BUN: 8 mg/dL (ref 6–20)
Calcium, Ion: 1.13 mmol/L — ABNORMAL LOW (ref 1.15–1.40)
Chloride: 100 mmol/L (ref 98–111)
Creatinine, Ser: 0.7 mg/dL (ref 0.44–1.00)
Glucose, Bld: 80 mg/dL (ref 70–99)
HCT: 41 % (ref 36.0–46.0)
Hemoglobin: 13.9 g/dL (ref 12.0–15.0)
Potassium: 3.5 mmol/L (ref 3.5–5.1)
Sodium: 139 mmol/L (ref 135–145)
TCO2: 28 mmol/L (ref 22–32)

## 2021-01-18 LAB — URINALYSIS, ROUTINE W REFLEX MICROSCOPIC
Bilirubin Urine: NEGATIVE
Glucose, UA: NEGATIVE mg/dL
Ketones, ur: NEGATIVE mg/dL
Nitrite: NEGATIVE
Protein, ur: NEGATIVE mg/dL
Specific Gravity, Urine: 1.003 — ABNORMAL LOW (ref 1.005–1.030)
pH: 6 (ref 5.0–8.0)

## 2021-01-18 LAB — CBG MONITORING, ED: Glucose-Capillary: 71 mg/dL (ref 70–99)

## 2021-01-18 LAB — PREGNANCY, URINE: Preg Test, Ur: NEGATIVE

## 2021-01-18 MED ORDER — CEPHALEXIN 500 MG PO CAPS: 500.0000 mg | ORAL_CAPSULE | Freq: Two times a day (BID) | ORAL | 0 refills | Status: DC

## 2021-01-18 MED ORDER — CEPHALEXIN 500 MG PO CAPS
500.0000 mg | ORAL_CAPSULE | Freq: Once | ORAL | Status: AC
  Administered 2021-01-18: 500 mg via ORAL
  Filled 2021-01-18: qty 1

## 2021-01-18 NOTE — Discharge Instructions (Signed)
Lab work is consistent with UTI, started you on antibiotics please take as prescribed.  Also recommend stay hydrated, please drink plenty of fluids this will help with your urinary symptoms.  You may also take over-the-counter pain medications like ibuprofen and or Tylenol every 6 as needed for pain.  Please follow-up with your PCP in weeks time for reevaluation of UTI.  Come back to the emergency department if you develop chest pain, shortness of breath, severe abdominal pain, uncontrolled nausea, vomiting, diarrhea.

## 2021-01-18 NOTE — ED Provider Notes (Signed)
[facility] EMERGENCY DEPARTMENT Provider Note   CSN: [id number] Arrival date & time: 01/18/21  1730     History Chief Complaint  Patient presents with   Back Pain    Anna Andrews is a 25 y.o. female.  HPI  Patient with no significant medical history presents to the emergency department with chief complaint of back pain.  Patient states back pain started about a week ago, came on suddenly, she feels pain mid upper back pain does not radiate, remains in her back, is worsened with movements, bending or twisting or walking, describes it as a stretching like sensation in her back.  She also notes that she has been having more urinary frequency denies dysuria hematuria flank pain, stomach pain, nausea, vomit, fevers or chills.  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   She denies paresthesia or weakness in the lower extremities, saddle paresthesias, urinary incontinency, retention, difficulty with bowel movements.  She denies chest pain, shortness of breath, peripheral edema, no history of IV drug use.  She also notes that she took her blood sugar today she is a nondiabetic states her blood sugar was 99.  Patient seen here yesterday for similar presentation chest x-ray was unremarkable was later discharged home.    Past Medical History:  Diagnosis Date   Acid reflux    Anxiety    Eczema     Patient Active Problem List   Diagnosis Date Noted   Gestational hypertension 04/27/2019   Supervision of normal first pregnancy 10/19/2018   Anxiety 10/19/2018    Past Surgical History:  Procedure Laterality Date   NO PAST SURGERIES       OB History     Gravida  1   Para  1   Term  1   Preterm  0   AB  0   Living  1      SAB  0   IAB  0   Ectopic  0   Multiple  0   Live Births  1           Family History  Problem Relation Age of Onset   Hypertension Paternal Grandfather     CAD Paternal Grandmother    Hypertension Mother     Social History   Tobacco Use   Smoking status: Former    Packs/day: 0.50    Types: Cigarettes   Smokeless tobacco: Never   Tobacco comments:    occ  Vaping Use   Vaping Use: Never used  Substance Use Topics   Alcohol use: No   Drug use: No    Home Medications Prior to Admission medications   Medication Sig Start Date End Date Taking? Authorizing Provider  cephALEXin (KEFLEX) 500 MG capsule Take 1 capsule (500 mg total) by mouth 2 (two) times daily for 7 days. 01/18/21 01/25/21 Yes Marcello Fennel, PA-C  acetaminophen (TYLENOL) 325 MG tablet Take 2 tablets (650 mg total) by mouth every 4 (four) hours as needed (for pain scale < 4). 04/30/19   Fair, Marin Shutter, MD  acetic acid 2 % otic solution Place 4 drops into the left ear 3 (three) times daily. 10/25/20   Wurst, [place], PA-C  azithromycin (ZITHROMAX) 250 MG tablet Take 1 tablet (250 mg total) by mouth daily. Take first 2 tablets together, then 1 every day until finished. 01/17/21   Vanessa Kick, MD  benzonatate (TESSALON) 100 MG capsule Take 1-2 capsules (100-200 mg total) by mouth 3 (three) times daily as needed for cough. 01/09/21   Wallis Bamberg, PA-C  Blood Pressure Monitor MISC For regular home bp monitoring during pregnancy 10/19/18   Cheral Marker, CNM  calcium carbonate (TUMS - DOSED IN MG ELEMENTAL CALCIUM) 500 MG chewable tablet Chew 2 tablets by mouth 2 (two) times daily as needed for indigestion or heartburn.    [provider]  cetirizine (ZYRTEC ALLERGY) 10 MG tablet Take 1 tablet (10 mg total) by mouth daily. 01/09/21   Wallis Bamberg, PA-C  cyclobenzaprine (FLEXERIL) 10 MG tablet Take 1 tablet (10 mg total) by mouth 2 (two) times daily as needed for muscle spasms. 09/13/20   Wurst, [place], PA-C  hydrocortisone cream 1 % Apply 1 application topically 2 (two) times daily as needed for itching (For eczema.).    [provider]  ibuprofen (ADVIL)  800 MG tablet Take 1 tablet (800 mg total) by mouth every 6 (six) hours as needed for moderate pain. 10/15/20   Gilda Crease, MD  naproxen (NAPROSYN) 500 MG tablet Take 1 tablet (500 mg total) by mouth 2 (two) times daily with a meal. 01/17/21   Eber Hong, MD  norethindrone-ethinyl estradiol (JUNEL FE 1/20) 1-20 MG-MCG tablet Take 1 tablet by mouth daily. 07/04/19   Cheral Marker, CNM  Norethindrone-Ethinyl Estradiol-Fe Biphas (LO LOESTRIN FE) 1 MG-10 MCG / 10 MCG tablet Take 1 tablet by mouth daily. 06/02/19   Cresenzo-Dishmon, Scarlette Calico, CNM  omeprazole (PRILOSEC) 20 MG capsule Take 1 capsule (20 mg total) by mouth daily. 10/04/20   Wurst, [place], PA-C  ondansetron (ZOFRAN) 4 MG tablet Take 1 tablet (4 mg total) by mouth every 6 (six) hours. 10/04/20   Wurst, [place], PA-C  predniSONE (DELTASONE) 20 MG tablet Take 2 tablets (40 mg total) by mouth daily. 12/15/20   Mardella Layman, MD  prenatal vitamin w/FE, FA (PRENATAL 1 + 1) 27-1 MG TABS tablet Take 1 tablet by mouth daily at 12 noon. 09/13/18   Adline Potter, NP  promethazine-dextromethorphan (PROMETHAZINE-DM) 6.25-15 MG/5ML syrup Take 5 mLs by mouth at bedtime as needed for cough. 01/09/21   Wallis Bamberg, PA-C  pseudoephedrine (SUDAFED) 60 MG tablet Take 1 tablet (60 mg total) by mouth every 8 (eight) hours as needed for congestion. 01/09/21   Wallis Bamberg, PA-C  sertraline (ZOLOFT) 50 MG tablet Take 1 tablet (50 mg total) by mouth daily. 05/03/20   Cresenzo-Dishmon, Scarlette Calico, CNM  enalapril (VASOTEC) 5 MG tablet Take 1 tablet (5 mg total) by mouth daily. Patient not taking: Reported on 06/02/2019 05/05/19 11/02/19  Jacklyn Shell, CNM    Allergies    Patient has no known allergies.  Review of Systems   Review of Systems  Constitutional:  Negative for chills and fever.  HENT:  Negative for congestion.   Respiratory:  Negative for shortness of breath.   Cardiovascular:  Negative for chest pain.  Gastrointestinal:   Negative for abdominal pain.  Genitourinary:  Positive for frequency. Negative for difficulty urinating, dysuria and enuresis.  Musculoskeletal:  Positive for back pain.  Skin:  Negative for rash.  Neurological:  Negative for dizziness.  Hematological:  Does not bruise/bleed easily.   Physical Exam Updated Vital Signs BP (!) 137/97 (BP Location: Right Arm)   Pulse 88   Temp 98 F (36.7 C) (Oral)   Resp 17   Ht 5\' 8"  (1.727 m)   Wt 106.1 kg  LMP 01/02/2021 (Approximate)   SpO2 100%   BMI 35.58 kg/m   Physical Exam Vitals and nursing note reviewed.  Constitutional:      General: She is not in acute distress.    Appearance: She is not ill-appearing.  HENT:     Head: Normocephalic and atraumatic.     Nose: No congestion.  Eyes:     Conjunctiva/sclera: Conjunctivae normal.  Cardiovascular:     Rate and Rhythm: Normal rate and regular rhythm.     Pulses: Normal pulses.     Heart sounds: No murmur heard.   No friction rub. No gallop.  Pulmonary:     Effort: No respiratory distress.     Breath sounds: No wheezing, rhonchi or rales.  Abdominal:     Palpations: Abdomen is soft.     Tenderness: There is no abdominal tenderness. There is no right CVA tenderness or left CVA tenderness.  Musculoskeletal:     Comments: Spine was visualized there is no overlying skin changes, spine was palpated nontender to palpation, no step-off or deformities present.  She does have noted tenderness along the paraspinal muscles on the left thoracic spine.  She has full range of motion lower extremities, 5 5 strength, neurovascularly intact.  Skin:    General: Skin is warm and dry.  Neurological:     Mental Status: She is alert.  Psychiatric:        Mood and Affect: Mood normal.    ED Results / Procedures / Treatments   Labs (all labs ordered are listed, but only abnormal results are displayed) Labs Reviewed  URINALYSIS, ROUTINE W REFLEX MICROSCOPIC - Abnormal; Notable for the following  components:      Result Value   Color, Urine STRAW (*)    Specific Gravity, Urine 1.003 (*)    Hgb urine dipstick SMALL (*)    Leukocytes,Ua TRACE (*)    Bacteria, UA MANY (*)    All other components within normal limits  I-STAT CHEM 8, ED - Abnormal; Notable for the following components:   Calcium, Ion 1.13 (*)    All other components within normal limits  URINE CULTURE  PREGNANCY, URINE  CBG MONITORING, ED    EKG None  Radiology DG Chest 2 View  Result Date: 01/17/2021 CLINICAL DATA:  Chest pain EXAM: CHEST - 2 VIEW COMPARISON:  Chest radiograph dated December 15, 2020 FINDINGS: The heart size and mediastinal contours are within normal limits. Both lungs are clear. The visualized skeletal structures are unremarkable. IMPRESSION: No active cardiopulmonary disease. Electronically Signed   By: Keane Police D.O.   On: 01/17/2021 15:50    Procedures Procedures   Medications Ordered in ED Medications - No data to display  ED Course  I have reviewed the triage vital signs and the nursing notes.  Pertinent labs & imaging results that were available during my care of the patient were reviewed by me and considered in my medical decision making (see chart for details).    MDM Rules/Calculators/A&P                          Initial impression-presents with back pain.  She is alert, does not appear acute stress, vital signs reassuring.  Likely this is a muscular strain will obtain UA for rule out of UTI, will also obtain i-STAT and CBG to reassure the patient.  Work-up-i-STAT Chem-8 unremarkable, urine pregnancy unremarkable, CBG 71, UA shows trace leukocytes, many bacteria.  Reassessment-patient  is endorsing urinary frequency with many bacteria seen her UA will culture urine and start her on antibiotics as I feel she likely has a UTI.  Rule out-I have low suspicion for spinal fracture or spinal cord abnormality as patient denies urinary incontinency, retention, difficulty with bowel  movements, denies saddle paresthesias.  Spine was palpated there is no step-off, crepitus or gross deformities felt, patient had 5/5 strength, full range of motion, neurovascular fully intact in the lower extremities.  Will defer imaging as there is no traumatic injury associate with this pain. Low suspicion for septic arthritis as patient denies IV drug use, skin exam was performed no erythematous, edema or warm joints noted.  Low suspicion for systemic infection as patient nontoxic-appearing, vital signs reassuring.  Low suspicion for kidney stone, [place] she has no flank tenderness, she is nontoxic-appearing, no CVA tenderness present my exam.  Low suspicion for dissection of the aorta as presentation is atypical of etiology.   Plan-  UTI-likely patient has a UTI as she is endorsing urinary frequency with an abnormal UA cultures are pending, will start her on antibiotics, follow-up with PCP for further evaluation. Back pain-likely this is a muscular strain as it is worsened with movement,  will have her continue with over-the-counter pain medication follow-up with PCP for further evaluation.  Vital signs have remained stable, no indication for hospital admission.   Patient given at home care as well strict return precautions.  Patient verbalized that they understood agreed to said plan.  Final Clinical Impression(s) / ED Diagnoses Final diagnoses:  Acute left-sided low back pain without sciatica  Acute cystitis without hematuria    Rx / DC Orders ED Discharge Orders          Ordered    cephALEXin (KEFLEX) 500 MG capsule  2 times daily        01/18/21 2045             Aron Baba 01/18/21 2046    Daleen Bo, MD 01/19/21 1228

## 2021-01-18 NOTE — ED Triage Notes (Signed)
Pt c/o lower back pain, increased urination, dry mouth x 1 week. Pt took her blood sugar at home and it was 99 when she woke up. No hx of diabetes.

## 2021-01-20 LAB — URINE CULTURE: Culture: NO GROWTH

## 2021-01-21 ENCOUNTER — Encounter: Payer: Self-pay | Admitting: Family

## 2021-01-22 ENCOUNTER — Emergency Department (HOSPITAL_COMMUNITY): Payer: 59

## 2021-01-22 ENCOUNTER — Encounter (HOSPITAL_COMMUNITY): Payer: Self-pay | Admitting: *Deleted

## 2021-01-22 ENCOUNTER — Other Ambulatory Visit: Payer: Self-pay

## 2021-01-22 ENCOUNTER — Emergency Department (HOSPITAL_COMMUNITY)
Admission: EM | Admit: 2021-01-22 | Discharge: 2021-01-22 | Disposition: A | Discharge status: Home / Self Care | Payer: 59 | Attending: Emergency Medicine | Admitting: Emergency Medicine

## 2021-01-22 DIAGNOSIS — M549 Dorsalgia, unspecified: Secondary | ICD-10-CM | POA: Diagnosis not present

## 2021-01-22 DIAGNOSIS — R1011 Right upper quadrant pain: Secondary | ICD-10-CM | POA: Insufficient documentation

## 2021-01-22 DIAGNOSIS — R109 Unspecified abdominal pain: Secondary | ICD-10-CM

## 2021-01-22 DIAGNOSIS — Z87891 Personal history of nicotine dependence: Secondary | ICD-10-CM | POA: Insufficient documentation

## 2021-01-22 DIAGNOSIS — M545 Low back pain, unspecified: Secondary | ICD-10-CM | POA: Diagnosis not present

## 2021-01-22 DIAGNOSIS — R1031 Right lower quadrant pain: Secondary | ICD-10-CM | POA: Diagnosis not present

## 2021-01-22 DIAGNOSIS — R197 Diarrhea, unspecified: Secondary | ICD-10-CM | POA: Diagnosis not present

## 2021-01-22 DIAGNOSIS — R102 Pelvic and perineal pain: Secondary | ICD-10-CM

## 2021-01-22 LAB — COMPREHENSIVE METABOLIC PANEL
ALT: 17 U/L (ref 0–44)
AST: 19 U/L (ref 15–41)
Albumin: 4.6 g/dL (ref 3.5–5.0)
Alkaline Phosphatase: 69 U/L (ref 38–126)
Anion gap: 9 (ref 5–15)
BUN: 10 mg/dL (ref 6–20)
CO2: 26 mmol/L (ref 22–32)
Calcium: 9.4 mg/dL (ref 8.9–10.3)
Chloride: 102 mmol/L (ref 98–111)
Creatinine, Ser: 0.69 mg/dL (ref 0.44–1.00)
GFR, Estimated: >60 mL/min (ref >60)
Glucose, Bld: 114 mg/dL — ABNORMAL HIGH (ref 70–99)
Potassium: 3.9 mmol/L (ref 3.5–5.1)
Sodium: 137 mmol/L (ref 135–145)
Total Bilirubin: 0.5 mg/dL (ref 0.3–1.2)
Total Protein: 8.5 g/dL — ABNORMAL HIGH (ref 6.5–8.1)

## 2021-01-22 LAB — CBC WITH DIFFERENTIAL/PLATELET
Abs Immature Granulocytes: 0.03 10*3/uL (ref 0.00–0.07)
Basophils Absolute: 0.1 10*3/uL (ref 0.0–0.1)
Basophils Relative: 1 %
Eosinophils Absolute: 0.1 10*3/uL (ref 0.0–0.5)
Eosinophils Relative: 1 %
HCT: 41.7 % (ref 36.0–46.0)
Hemoglobin: 13.4 g/dL (ref 12.0–15.0)
Immature Granulocytes: 0 %
Lymphocytes Relative: 15 %
Lymphs Abs: 1.3 10*3/uL (ref 0.7–4.0)
MCH: 26.3 pg (ref 26.0–34.0)
MCHC: 32.1 g/dL (ref 30.0–36.0)
MCV: 81.9 fL (ref 80.0–100.0)
Monocytes Absolute: 0.4 10*3/uL (ref 0.1–1.0)
Monocytes Relative: 5 %
Neutro Abs: 6.7 10*3/uL (ref 1.7–7.7)
Neutrophils Relative %: 78 %
Platelets: 389 10*3/uL (ref 150–400)
RBC: 5.09 MIL/uL (ref 3.87–5.11)
RDW: 14.2 % (ref 11.5–15.5)
WBC: 8.4 10*3/uL (ref 4.0–10.5)
nRBC: 0 % (ref 0.0–0.2)

## 2021-01-22 LAB — URINALYSIS, ROUTINE W REFLEX MICROSCOPIC
Bilirubin Urine: NEGATIVE
Glucose, UA: NEGATIVE mg/dL
Ketones, ur: NEGATIVE mg/dL
Leukocytes,Ua: NEGATIVE
Nitrite: NEGATIVE
Protein, ur: NEGATIVE mg/dL
Specific Gravity, Urine: 1.006 (ref 1.005–1.030)
pH: 7 (ref 5.0–8.0)

## 2021-01-22 LAB — LIPASE, BLOOD: Lipase: 22 U/L (ref 11–51)

## 2021-01-22 LAB — PREGNANCY, URINE: Preg Test, Ur: NEGATIVE

## 2021-01-22 MED ORDER — IOHEXOL 300 MG/ML  SOLN
100.0000 mL | Freq: Once | INTRAMUSCULAR | Status: AC | PRN
  Administered 2021-01-22: 100 mL via INTRAVENOUS

## 2021-01-22 NOTE — ED Triage Notes (Addendum)
Pt c/o continued lower back pain that has now progressed to lower abdomen with diarrhea. Pt was seen here the other day on 01/18/21 and diagnosed with UTI.

## 2021-01-22 NOTE — ED Provider Notes (Signed)
[facility] EMERGENCY DEPARTMENT Provider Note   CSN: [id number] Arrival date & time: 01/22/21  2458     History Chief Complaint  Patient presents with   Abdominal Pain    Anna Andrews is a 25 y.o. female.  HPI She is here for evaluation of bilateral flank pain and abdominal pain that has been ongoing for several days despite being treated with antibiotic for suspected UTI.  She was evaluated, here, on 01/18/2021.  At that time diagnosed with UTI and treated with cephalexin.  Since then a urine culture done that day has returned and is negative.  The patient denies urine symptoms at this time.  She has bilateral flank pain, which radiates to the right upper quadrant.  She denies nausea, vomiting, weakness or dizziness.  She states that family members have had both kidney stones and diverticulitis.  She has not had either.  The pain is worse when she tries to move around.  She has had diarrhea which started today.  She has soft brown stool without visible blood.  There are no other known active modifying factors.    Past Medical History:  Diagnosis Date   Acid reflux    Anxiety    Eczema     Patient Active Problem List   Diagnosis Date Noted   Gestational hypertension 04/27/2019   Supervision of normal first pregnancy 10/19/2018   Anxiety 10/19/2018    Past Surgical History:  Procedure Laterality Date   NO PAST SURGERIES       OB History     Gravida  1   Para  1   Term  1   Preterm  0   AB  0   Living  1      SAB  0   IAB  0   Ectopic  0   Multiple  0   Live Births  1           Family History  Problem Relation Age of Onset   Hypertension Paternal Grandfather    CAD Paternal Grandmother    Hypertension Mother     Social History   Tobacco Use   Smoking status: Former    Packs/day: 0.50    Types: Cigarettes   Smokeless tobacco: Never   Tobacco comments:    occ  Vaping Use   Vaping Use: Never used  Substance Use Topics   Alcohol  use: No   Drug use: No    Home Medications Prior to Admission medications   Medication Sig Start Date End Date Taking? Authorizing Provider  acetaminophen (TYLENOL) 325 MG tablet Take 2 tablets (650 mg total) by mouth every 4 (four) hours as needed (for pain scale < 4). 04/30/19   Fair, Hoyle Sauer, MD  acetic acid 2 % otic solution Place 4 drops into the left ear 3 (three) times daily. 10/25/20   Wurst, [place], PA-C  azithromycin (ZITHROMAX) 250 MG tablet Take 1 tablet (250 mg total) by mouth daily. Take first 2 tablets together, then 1 every day until finished. 01/17/21   Mardella Layman, MD  benzonatate (TESSALON) 100 MG capsule Take 1-2 capsules (100-200 mg total) by mouth 3 (three) times daily as needed for cough. 01/09/21   Wallis Bamberg, PA-C  Blood Pressure Monitor MISC For regular home bp monitoring during pregnancy 10/19/18   Cheral Marker, CNM  calcium carbonate (TUMS - DOSED IN MG ELEMENTAL CALCIUM) 500 MG chewable tablet Chew 2 tablets by mouth 2 (two) times daily as  needed for indigestion or heartburn.    [provider]  cephALEXin (KEFLEX) 500 MG capsule Take 1 capsule (500 mg total) by mouth 2 (two) times daily for 7 days. 01/18/21 01/25/21  Carroll Sage, PA-C  cetirizine (ZYRTEC ALLERGY) 10 MG tablet Take 1 tablet (10 mg total) by mouth daily. 01/09/21   Wallis Bamberg, PA-C  cyclobenzaprine (FLEXERIL) 10 MG tablet Take 1 tablet (10 mg total) by mouth 2 (two) times daily as needed for muscle spasms. 09/13/20   Wurst, [place], PA-C  hydrocortisone cream 1 % Apply 1 application topically 2 (two) times daily as needed for itching (For eczema.).    [provider]  ibuprofen (ADVIL) 800 MG tablet Take 1 tablet (800 mg total) by mouth every 6 (six) hours as needed for moderate pain. 10/15/20   Gilda Crease, MD  naproxen (NAPROSYN) 500 MG tablet Take 1 tablet (500 mg total) by mouth 2 (two) times daily with a meal. 01/17/21   Eber Hong, MD   norethindrone-ethinyl estradiol (JUNEL FE 1/20) 1-20 MG-MCG tablet Take 1 tablet by mouth daily. 07/04/19   Cheral Marker, CNM  Norethindrone-Ethinyl Estradiol-Fe Biphas (LO LOESTRIN FE) 1 MG-10 MCG / 10 MCG tablet Take 1 tablet by mouth daily. 06/02/19   Cresenzo-Dishmon, Scarlette Calico, CNM  omeprazole (PRILOSEC) 20 MG capsule Take 1 capsule (20 mg total) by mouth daily. 10/04/20   Wurst, [place], PA-C  ondansetron (ZOFRAN) 4 MG tablet Take 1 tablet (4 mg total) by mouth every 6 (six) hours. 10/04/20   Wurst, [place], PA-C  predniSONE (DELTASONE) 20 MG tablet Take 2 tablets (40 mg total) by mouth daily. 12/15/20   Mardella Layman, MD  prenatal vitamin w/FE, FA (PRENATAL 1 + 1) 27-1 MG TABS tablet Take 1 tablet by mouth daily at 12 noon. 09/13/18   Adline Potter, NP  promethazine-dextromethorphan (PROMETHAZINE-DM) 6.25-15 MG/5ML syrup Take 5 mLs by mouth at bedtime as needed for cough. 01/09/21   Wallis Bamberg, PA-C  pseudoephedrine (SUDAFED) 60 MG tablet Take 1 tablet (60 mg total) by mouth every 8 (eight) hours as needed for congestion. 01/09/21   Wallis Bamberg, PA-C  sertraline (ZOLOFT) 50 MG tablet Take 1 tablet (50 mg total) by mouth daily. 05/03/20   Cresenzo-Dishmon, Scarlette Calico, CNM  enalapril (VASOTEC) 5 MG tablet Take 1 tablet (5 mg total) by mouth daily. Patient not taking: Reported on 06/02/2019 05/05/19 11/02/19  Jacklyn Shell, CNM    Allergies    Patient has no known allergies.  Review of Systems   Review of Systems  All other systems reviewed and are negative.  Physical Exam Updated Vital Signs BP 139/89 (BP Location: Right Arm)   Pulse 82   Temp 98.5 F (36.9 C)   Resp 16   Ht 5\' 8"  (1.727 m)   Wt 106.1 kg   LMP 01/09/2021 (Approximate)   SpO2 100%   BMI 35.58 kg/m   Physical Exam Vitals and nursing note reviewed.  Constitutional:      Appearance: She is well-developed. She is obese. She is not ill-appearing, toxic-appearing or diaphoretic.  HENT:     Head:  Normocephalic and atraumatic.     Right Ear: External ear normal.     Left Ear: External ear normal.  Eyes:     Conjunctiva/sclera: Conjunctivae normal.     Pupils: Pupils are equal, round, and reactive to light.  Neck:     Trachea: Phonation normal.  Cardiovascular:     Rate and Rhythm: Normal rate.  Pulmonary:  Effort: Pulmonary effort is normal.  Abdominal:     General: There is no distension.  Musculoskeletal:        General: Normal range of motion.     Cervical back: Normal range of motion and neck supple.  Skin:    General: Skin is warm and dry.  Neurological:     Mental Status: She is alert and oriented to person, place, and time.     Cranial Nerves: No cranial nerve deficit.     Sensory: No sensory deficit.     Motor: No abnormal muscle tone.     Coordination: Coordination normal.  Psychiatric:        Mood and Affect: Mood normal.        Behavior: Behavior normal.        Thought Content: Thought content normal.        Judgment: Judgment normal.    ED Results / Procedures / Treatments   Labs (all labs ordered are listed, but only abnormal results are displayed) Labs Reviewed  COMPREHENSIVE METABOLIC PANEL - Abnormal; Notable for the following components:      Result Value   Glucose, Bld 114 (*)    Total Protein 8.5 (*)    All other components within normal limits  URINALYSIS, ROUTINE W REFLEX MICROSCOPIC - Abnormal; Notable for the following components:   Color, Urine STRAW (*)    Hgb urine dipstick SMALL (*)    Bacteria, UA RARE (*)    All other components within normal limits  CBC WITH DIFFERENTIAL/PLATELET  LIPASE, BLOOD  PREGNANCY, URINE    EKG None  Radiology CT Abdomen Pelvis W Contrast  Result Date: 01/22/2021 CLINICAL DATA:  Mid abdominal pain.  Abdominal infection suspected. EXAM: CT ABDOMEN AND PELVIS WITH CONTRAST TECHNIQUE: Multidetector CT imaging of the abdomen and pelvis was performed using the standard protocol following bolus  administration of intravenous contrast. CONTRAST:  OMNIPAQUE IOHEXOL 300 MG/ML  SOLN COMPARISON:  None. FINDINGS: Lower chest: Lung bases are clear.  No pleural effusions. Hepatobiliary: Normal appearance of the liver, gallbladder and portal venous system. Pancreas: Unremarkable. No pancreatic ductal dilatation or surrounding inflammatory changes. Spleen: Normal in size without focal abnormality. Adrenals/Urinary Tract: Normal adrenal glands. Normal appearance of both kidneys without hydronephrosis. No suspicious renal lesions. Normal appearance of the urinary bladder. Stomach/Bowel: Stomach is within normal limits. Appendix appears normal. No evidence of bowel wall thickening, distention, or inflammatory changes. Vascular/Lymphatic: No significant vascular findings are present. No enlarged abdominal or pelvic lymph nodes. Reproductive: Retro positioned uterus. Small amount of low-density free fluid in the cul-de-sac. Ovarian and adnexal tissue is slightly prominent but nonspecific. Other: Trace free fluid in the pelvis. Tiny umbilical hernia containing fat. Musculoskeletal: No acute bone abnormality. IMPRESSION: 1. No acute abnormality in the abdomen or pelvis. 2. Small amount of free fluid in the pelvis is likely physiologic. Ovaries and adnexal tissue are prominent but poorly characterized on this examination. If this is an area of concern, consider further characterization with pelvic ultrasound. Electronically Signed   By: Richarda Overlie M.D.   On: 01/22/2021 12:12   US PELVIC COMPLETE WITH TRANSVAGINAL  Result Date: 01/22/2021 CLINICAL DATA:  Right-sided pain recent diagnosis of UTI EXAM: TRANSABDOMINAL AND TRANSVAGINAL ULTRASOUND OF PELVIS TECHNIQUE: Both transabdominal and transvaginal ultrasound examinations of the pelvis were performed. Transabdominal technique was performed for global imaging of the pelvis including uterus, ovaries, adnexal regions, and pelvic cul-de-sac. It was necessary to  proceed with endovaginal exam following the transabdominal  exam to visualize the endometrium, ovaries, and adnexa. COMPARISON:  None FINDINGS: Uterus Measurements: 8.4 x 4.5 x 4.9 cm = volume: 108 mL. No fibroids or other mass visualized. Endometrium Thickness: 6 mm.  No focal abnormality visualized. Right ovary Measurements: 3.4 x 2.5 x 1.9 cm = volume: 8 mL. Normal appearance/no adnexal mass. Left ovary Measurements: 3.1 x 2.4 x 2.9 cm = volume: 8 mL. Normal appearance/no adnexal mass. Other findings Small volume free fluid. IMPRESSION: Small volume free fluid in the pelvis, likely physiologic functional fluid in the reproductive age setting. Otherwise normal ultrasound of the pelvis. No ultrasound findings to explain pain. Electronically Signed   By: Jearld Lesch M.D.   On: 01/22/2021 13:41    Procedures Procedures   Medications Ordered in ED Medications  iohexol (OMNIPAQUE) 300 MG/ML solution 100 mL (100 mLs Intravenous Contrast Given 01/22/21 1143)    ED Course  I have reviewed the triage vital signs and the nursing notes.  Pertinent labs & imaging results that were available during my care of the patient were reviewed by me and considered in my medical decision making (see chart for details).    MDM Rules/Calculators/A&P                            Patient Vitals for the past 24 hrs:  BP Temp Temp src Pulse Resp SpO2 Height Weight  01/22/21 1415 139/89 98.5 F (36.9 C) -- 82 16 100 % -- --  01/22/21 1133 133/77 98.3 F (36.8 C) Oral 89 16 98 % -- --  01/22/21 1004 (!) 122/94 98.1 F (36.7 C) Oral 93 18 100 % -- --  01/22/21 0959 -- -- -- -- -- --  (1.727 m) 106.1 kg    At the time of discharge- reevaluation with update and discussion. After initial assessment and treatment, an updated evaluation reveals she is comfortable and has no further complaints.  Findings discussed and questions answered. Mancel Bale   Medical Decision Making:  This patient is presenting for  evaluation of flank pain and abdominal pain, which does require a range of treatment options, and is a complaint that involves a moderate risk of morbidity and mortality. The differential diagnoses include persistent UTI, intestinal disorders, acute illness and metabolic disorders. I decided to review old records, and in summary into female presenting with flank and abdominal pain, currently being treated for UTI.  Last menstrual cycle was 2 weeks ago..  I did not require additional historical information from anyone.  Clinical Laboratory Tests Ordered, included CBC, Metabolic panel, Urinalysis, Pregnancy test, and lipase . Review indicates essentially normal. Radiologic Tests Ordered, included CT abdomen pelvis, ultrasound pelvis.  I independently Visualized: Radiographic images, which show no significant acute abnormalities    Critical Interventions-clinical evaluation, laboratory testing, observation and reassessment  After These Interventions, the Patient was reevaluated and was found stable for discharge.  Pain without abnormal findings on evaluation.  Doubt UTI, pyelonephritis, intestinal disorder or significant acute pelvic pathology.  She can be treated symptomatically and expectantly.  CRITICAL CARE-no Performed by: Mancel Bale  Nursing Notes Reviewed/ Care Coordinated Applicable Imaging Reviewed Interpretation of Laboratory Data incorporated into ED treatment  The patient appears reasonably screened and/or stabilized for discharge and I doubt any other medical condition or other [facility] requiring further screening, evaluation, or treatment in the ED at this time prior to discharge.  Plan: Home Medications-continue current; Home Treatments-rest, fluids; return here if the recommended  treatment, does not improve the symptoms; Recommended follow up-PCP, as needed     Final Clinical Impression(s) / ED Diagnoses Final diagnoses:  Abdominal pain, unspecified abdominal location  Back  pain, unspecified back location, unspecified back pain laterality, unspecified chronicity    Rx / DC Orders ED Discharge Orders     None        Mancel Bale, MD 01/22/21 1701

## 2021-01-22 NOTE — Discharge Instructions (Signed)
The testing today did not show any problems.  The urine culture that was done on November 11 was normal.  You do not have a UTI, kidney infection, intestinal disorder or female genital disorder.  You do not need to take any more antibiotics at this time.  For pain take Tylenol if needed.  Follow-up with your primary care doctor if you have additional problems.

## 2021-01-25 ENCOUNTER — Encounter: Payer: Self-pay | Admitting: Family

## 2021-01-25 ENCOUNTER — Ambulatory Visit (INDEPENDENT_AMBULATORY_CARE_PROVIDER_SITE_OTHER): Payer: 59 | Admitting: Family

## 2021-01-25 ENCOUNTER — Other Ambulatory Visit: Payer: Self-pay

## 2021-01-25 VITALS — BP 122/84 | HR 88 | Temp 98.3°F | Resp 16 | Ht 67.0 in | Wt 239.0 lb

## 2021-01-25 DIAGNOSIS — M79661 Pain in right lower leg: Secondary | ICD-10-CM

## 2021-01-25 DIAGNOSIS — Z0000 Encounter for general adult medical examination without abnormal findings: Secondary | ICD-10-CM

## 2021-01-25 DIAGNOSIS — M7989 Other specified soft tissue disorders: Secondary | ICD-10-CM

## 2021-01-25 DIAGNOSIS — E669 Obesity, unspecified: Secondary | ICD-10-CM | POA: Diagnosis not present

## 2021-01-25 LAB — CBC WITH DIFFERENTIAL/PLATELET
Basophils Absolute: 0.1 10*3/uL (ref 0.0–0.1)
Basophils Relative: 0.7 % (ref 0.0–3.0)
Eosinophils Absolute: 0.1 10*3/uL (ref 0.0–0.7)
Eosinophils Relative: 1 % (ref 0.0–5.0)
HCT: 37.5 % (ref 36.0–46.0)
Hemoglobin: 12 g/dL (ref 12.0–15.0)
Lymphocytes Relative: 20.9 % (ref 12.0–46.0)
Lymphs Abs: 1.5 10*3/uL (ref 0.7–4.0)
MCHC: 32 g/dL (ref 30.0–36.0)
MCV: 80.2 fl (ref 78.0–100.0)
Monocytes Absolute: 0.4 10*3/uL (ref 0.1–1.0)
Monocytes Relative: 5.5 % (ref 3.0–12.0)
Neutro Abs: 5.3 10*3/uL (ref 1.4–7.7)
Neutrophils Relative %: 71.9 % (ref 43.0–77.0)
Platelets: 353 10*3/uL (ref 150.0–400.0)
RBC: 4.68 Mil/uL (ref 3.87–5.11)
RDW: 14.7 % (ref 11.5–15.5)
WBC: 7.4 10*3/uL (ref 4.0–10.5)

## 2021-01-25 LAB — LIPID PANEL
Cholesterol: 167 mg/dL (ref 0–200)
HDL: 39.7 mg/dL (ref >39.00)
LDL Cholesterol: 102 mg/dL — ABNORMAL HIGH (ref 0–99)
NonHDL: 127.24
Total CHOL/HDL Ratio: 4
Triglycerides: 126 mg/dL (ref 0.0–149.0)
VLDL: 25.2 mg/dL (ref 0.0–40.0)

## 2021-01-25 LAB — COMPREHENSIVE METABOLIC PANEL
ALT: 11 U/L (ref 0–35)
AST: 12 U/L (ref 0–37)
Albumin: 4.2 g/dL (ref 3.5–5.2)
Alkaline Phosphatase: 63 U/L (ref 39–117)
BUN: 10 mg/dL (ref 6–23)
CO2: 28 mEq/L (ref 19–32)
Calcium: 9.1 mg/dL (ref 8.4–10.5)
Chloride: 103 mEq/L (ref 96–112)
Creatinine, Ser: 0.68 mg/dL (ref 0.40–1.20)
GFR: 119.29 mL/min (ref >60.00)
Glucose, Bld: 92 mg/dL (ref 70–99)
Potassium: 4.5 mEq/L (ref 3.5–5.1)
Sodium: 138 mEq/L (ref 135–145)
Total Bilirubin: 0.3 mg/dL (ref 0.2–1.2)
Total Protein: 7 g/dL (ref 6.0–8.3)

## 2021-01-25 LAB — TSH: TSH: 2.04 u[IU]/mL (ref 0.35–5.50)

## 2021-01-25 MED ORDER — HYDROCHLOROTHIAZIDE 12.5 MG PO TABS: 12.5000 mg | ORAL_TABLET | Freq: Every morning | ORAL | 1 refills | Status: DC

## 2021-01-25 NOTE — Assessment & Plan Note (Signed)
Pt lives w/BF and 25 yo. 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 PAP UTD. Using condoms for [facility], regular menses.

## 2021-01-25 NOTE — Progress Notes (Signed)
Phone [phone]   Subjective:   Patient is a 25 y.o. female presenting for annual physical.    Chief Complaint  Patient presents with   Establish Care    Leg pain    See problem oriented charting- ROS- full  review of systems was completed and negative except for: leg pain. Edema: Patient complains of edema and pain. The location of the edema is lower leg(s) right.  The edema has been moderate and localized.  Onset of symptoms was a few months ago, unchanged since that time. The edema is present intermittently, upon awakening, in the afternoon, in the evening, at bedtime, and during sleep. The patient states the problem is intermittent for a few months.  The swelling has been aggravated by dependency of involved area, increased salt intake, and standing for long periods , relieved by  rest , and been associated with nothing. Cardiac risk factors include obesity (BMI >= 30 kg/m2).   The following were reviewed and entered/updated in epic: Past Medical History:  Diagnosis Date   Acid reflux    Anxiety    Eczema    Patient Active Problem List   Diagnosis Date Noted   Annual physical exam 01/25/2021   Pain and swelling of right lower leg 01/25/2021   Obesity (BMI 30-39.9) 01/25/2021   Gestational hypertension 04/27/2019   Supervision of normal first pregnancy 10/19/2018   Anxiety 10/19/2018   Past Surgical History:  Procedure Laterality Date   NO PAST SURGERIES      Family History  Problem Relation Age of Onset   Hypertension Paternal Grandfather    CAD Paternal Grandmother    Hypertension Mother     Medications- reviewed and updated Current Outpatient Medications  Medication Sig Dispense Refill   hydrochlorothiazide (HYDRODIURIL) 12.5 MG tablet Take 1 tablet (12.5 mg total) by mouth in the morning. 30 tablet 1   hydrocortisone cream 1 % Apply 1 application topically 2 (two) times daily as needed for itching (For eczema.).     sertraline (ZOLOFT) 50 MG tablet Take 1  tablet (50 mg total) by mouth daily. 30 tablet 11   No current facility-administered medications for this visit.    Allergies-reviewed and updated No Known Allergies  Social History   Social History Narrative   Not on file   Objective  Objective:  BP 122/84   Pulse 88   Temp 98.3 F (36.8 C)   Resp 16   Ht 5\' 7"  (1.702 m)   Wt 239 lb (108.4 kg)   LMP 01/09/2021 (Approximate)   SpO2 99%   BMI 37.43 kg/m  Gen: NAD, resting comfortably HEENT: Mucous membranes are moist. Oropharynx normal Neck: no thyromegaly CV: RRR no murmurs rubs or gallops Lungs: CTAB no crackles, wheeze, rhonchi Abdomen: soft/nontender/nondistended/normal bowel sounds. No rebound or guarding.  Ext: no edema Skin: warm, dry Neuro: grossly normal, moves all extremities, PERRLA   Assessment and Plan   Health Maintenance counseling: 1. Anticipatory guidance: Patient counseled regarding regular dental exams q6 months, eye exams,  avoiding smoking and second hand smoke, limiting alcohol to 1 beverage per day, no illicit drugs.   2. Risk factor reduction:  Advised patient of need for regular exercise and diet rich and fruits and vegetables to reduce risk of heart attack and stroke. Exercise- none.  Wt Readings from Last 3 Encounters:  01/25/21 239 lb (108.4 kg)  01/22/21 234 lb (106.1 kg)  01/18/21 234 lb (106.1 kg)   3. Immunizations/screenings/ancillary studies Immunization History  Administered Date(s)  Administered   Influenza,inj,Quad PF,6+ Mos 12/22/2018   Tdap 02/16/2019   Health Maintenance Due  Topic Date Due   COVID-19 Vaccine (1) Never done   Hepatitis C Screening  Never done   INFLUENZA VACCINE  10/08/2020   4. Cervical cancer screening- 2020 5. Breast cancer screening-  mammogram  6. Colon cancer screening - N/A 7. Skin cancer screening- advised regular sunscreen use. Denies worrisome, changing, or new skin lesions.  8. Birth control/STD check- condoms 9. Osteoporosis screening-  N/A 10. Smoking associated screening - non- smoker- just 3 weeks ago, smoked 1/2 pp week.   Recommended follow up: Return in about 4 weeks (around 02/22/2021) for leg swelling, pain. Future Appointments  Date Time Provider Department Center  01/29/2021  2:10 PM Adline Potter, NP [facility]    Lab/Order associations: non- fasting   ICD-10-CM   1. Annual physical exam  Z00.00 Comprehensive metabolic panel    TSH    Lipid panel    CBC with Differential/Platelet    2. Pain and swelling of right lower leg  M79.661 hydrochlorothiazide (HYDRODIURIL) 12.5 MG tablet   M79.89     3. Obesity (BMI 30-39.9)  E66.9       Meds ordered this encounter  Medications   hydrochlorothiazide (HYDRODIURIL) 12.5 MG tablet    Sig: Take 1 tablet (12.5 mg total) by mouth in the morning.    Dispense:  30 tablet    Refill:  1    Order Specific Question:   Supervising Provider    Answer:   ANDY, CAMILLE L [[id number]]    Return precautions advised.  Dulce Sellar, NP

## 2021-01-25 NOTE — Assessment & Plan Note (Signed)
Pt reports being seen in ER several times over last 35months, DVT has been ruled out, but no one tells her what is wrong. Reports occasional swelling, no erythema, tenderness to palpation on lower anterior leg left of tibia. 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  Advised to wear compression up to her just below the knee, hydrate with 64oz water qd, avoid sodium in diet.

## 2021-01-25 NOTE — Patient Instructions (Signed)
Welcome to [facility]! It was a pleasure meeting you today.  Go to the lab for blood work today. I will review the results in a few days on MyChart. As discussed, I have sent a medication for your leg swelling that will also maintain your blood pressure to your pharmacy.  Please schedule a 1 month follow up visit today.  PLEASE NOTE:  If you had any LAB tests please let us know if you have not heard back within a few days. You may see your results on MyChart before we have a chance to review them but we will give you a call once they are reviewed by [place]. If we ordered any REFERRALS today, please let us know if you have not heard from their office within the next week.  Let us know through MyChart if you are needing REFILLS, or have your pharmacy send [place] the request. You can also use MyChart to communicate with me or any office staff.  Please try these tips to maintain a healthy lifestyle:  Eat most of your calories during the day when you are active. Eliminate processed foods including packaged sweets (pies, cakes, cookies), reduce intake of potatoes, white bread, white pasta, and white rice. Look for whole grain options, oat flour or almond flour.  Each meal should contain half fruits/vegetables, one quarter protein, and one quarter carbs (no bigger than a computer mouse).  Cut down on sweet beverages. This includes juice, soda, and sweet tea. Also watch fruit intake, though this is a healthier sweet option, it still contains natural sugar! Limit to 3 servings daily.  Drink at least 1 glass of water with each meal and aim for at least 8 glasses per day  Exercise at least 150 minutes every week.

## 2021-01-26 ENCOUNTER — Emergency Department (HOSPITAL_COMMUNITY): Payer: 59

## 2021-01-26 ENCOUNTER — Other Ambulatory Visit: Payer: Self-pay

## 2021-01-26 ENCOUNTER — Encounter (HOSPITAL_COMMUNITY): Payer: Self-pay | Admitting: Emergency Medicine

## 2021-01-26 ENCOUNTER — Emergency Department (HOSPITAL_COMMUNITY)
Admission: EM | Admit: 2021-01-26 | Discharge: 2021-01-26 | Disposition: A | Discharge status: Home / Self Care | Payer: 59 | Attending: Emergency Medicine | Admitting: Emergency Medicine

## 2021-01-26 DIAGNOSIS — R1013 Epigastric pain: Secondary | ICD-10-CM | POA: Diagnosis not present

## 2021-01-26 DIAGNOSIS — Z87891 Personal history of nicotine dependence: Secondary | ICD-10-CM | POA: Diagnosis not present

## 2021-01-26 DIAGNOSIS — K219 Gastro-esophageal reflux disease without esophagitis: Secondary | ICD-10-CM | POA: Insufficient documentation

## 2021-01-26 DIAGNOSIS — R0789 Other chest pain: Secondary | ICD-10-CM | POA: Diagnosis not present

## 2021-01-26 LAB — HEPATIC FUNCTION PANEL
ALT: 18 U/L (ref 0–44)
AST: 17 U/L (ref 15–41)
Albumin: 4.4 g/dL (ref 3.5–5.0)
Alkaline Phosphatase: 66 U/L (ref 38–126)
Bilirubin, Direct: <0.1 mg/dL (ref 0.0–0.2)
Total Bilirubin: 0.4 mg/dL (ref 0.3–1.2)
Total Protein: 8.1 g/dL (ref 6.5–8.1)

## 2021-01-26 LAB — BASIC METABOLIC PANEL
Anion gap: 9 (ref 5–15)
BUN: 9 mg/dL (ref 6–20)
CO2: 26 mmol/L (ref 22–32)
Calcium: 9.2 mg/dL (ref 8.9–10.3)
Chloride: 102 mmol/L (ref 98–111)
Creatinine, Ser: 0.66 mg/dL (ref 0.44–1.00)
GFR, Estimated: >60 mL/min (ref >60)
Glucose, Bld: 90 mg/dL (ref 70–99)
Potassium: 3.6 mmol/L (ref 3.5–5.1)
Sodium: 137 mmol/L (ref 135–145)

## 2021-01-26 LAB — CBC
HCT: 38.5 % (ref 36.0–46.0)
Hemoglobin: 12.1 g/dL (ref 12.0–15.0)
MCH: 26.1 pg (ref 26.0–34.0)
MCHC: 31.4 g/dL (ref 30.0–36.0)
MCV: 83 fL (ref 80.0–100.0)
Platelets: 355 10*3/uL (ref 150–400)
RBC: 4.64 MIL/uL (ref 3.87–5.11)
RDW: 14.4 % (ref 11.5–15.5)
WBC: 9.4 10*3/uL (ref 4.0–10.5)
nRBC: 0 % (ref 0.0–0.2)

## 2021-01-26 LAB — TROPONIN I (HIGH SENSITIVITY)
Troponin I (High Sensitivity): <2 ng/L (ref <18)
Troponin I (High Sensitivity): <2 ng/L (ref <18)

## 2021-01-26 LAB — D-DIMER, QUANTITATIVE: D-Dimer, Quant: <0.27 ug/mL-FEU (ref 0.00–0.50)

## 2021-01-26 MED ORDER — PANTOPRAZOLE SODIUM 20 MG PO TBEC: 20.0000 mg | DELAYED_RELEASE_TABLET | Freq: Every day | ORAL | 0 refills | Status: DC

## 2021-01-26 NOTE — ED Provider Notes (Signed)
[facility] EMERGENCY DEPARTMENT Provider Note   CSN: [id number] Arrival date & time: 01/26/21  1710     History Chief Complaint  Patient presents with   Chest Pain    Anna Andrews is a 25 y.o. female.  Patient complains of periodic chest discomfort and epigastric discomfort  The history is provided by the patient and medical records. No language interpreter was used.  Chest Pain Pain location:  R chest and epigastric Pain quality: aching   Pain radiates to:  Does not radiate Pain severity:  Moderate Onset quality:  Gradual Timing:  Constant Progression:  Waxing and waning Chronicity:  New Associated symptoms: abdominal pain   Associated symptoms: no back pain, no cough, no fatigue and no headache       Past Medical History:  Diagnosis Date   Acid reflux    Anxiety    Eczema    Gestational hypertension 04/27/2019   Supervision of normal first pregnancy 10/19/2018     FAMILY TREE  LAB RESULTS  Language English Pap 08/20/16 neg  Initiated care at [facility] GC/CT Initial: neg/neg        36wks: neg/neg  Dating by LMP c/w 9wk U/S    Support person [employer] NT/IT/AFP/MaterniT21:declined    [place]/HgbE neg  Flu vaccine 12/22/18  CF declined  TDaP vaccine 02/16/19  SMA declined  Rhogam N/A Fragile X declined       Anatomy US Normal female Blood Type A/Positive/-- (08/11     Patient Active Problem List   Diagnosis Date Noted   Annual physical exam 01/25/2021   Pain and swelling of right lower leg 01/25/2021   Obesity (BMI 30-39.9) 01/25/2021   Anxiety 10/19/2018    Past Surgical History:  Procedure Laterality Date   NO PAST SURGERIES       OB History     Gravida  1   Para  1   Term  1   Preterm  0   AB  0   Living  1      SAB  0   IAB  0   Ectopic  0   Multiple  0   Live Births  1           Family History  Problem Relation Age of Onset   Hypertension Paternal Grandfather    CAD Paternal Grandmother    Hypertension Mother     Social  History   Tobacco Use   Smoking status: Former    Packs/day: 0.50    Types: Cigarettes   Smokeless tobacco: Never   Tobacco comments:    occ  Vaping Use   Vaping Use: Never used  Substance Use Topics   Alcohol use: No   Drug use: No    Home Medications Prior to Admission medications   Medication Sig Start Date End Date Taking? Authorizing Provider  hydrocortisone cream 1 % Apply 1 application topically 2 (two) times daily as needed for itching (For eczema.).   Yes [provider]  pantoprazole (PROTONIX) 20 MG tablet Take 1 tablet (20 mg total) by mouth daily. 01/26/21  Yes Milton Ferguson, MD  sertraline (ZOLOFT) 50 MG tablet Take 1 tablet (50 mg total) by mouth daily. 05/03/20  Yes Cresenzo-Dishmon, Joaquim Lai, CNM  hydrochlorothiazide (HYDRODIURIL) 12.5 MG tablet Take 1 tablet (12.5 mg total) by mouth in the morning. 01/25/21   Jeanie Sewer, NP  enalapril (VASOTEC) 5 MG tablet Take 1 tablet (5 mg total) by mouth daily. Patient not taking: Reported  on 06/02/2019 05/05/19 11/02/19  Christin Fudge, CNM    Allergies    Patient has no known allergies.  Review of Systems   Review of Systems  Constitutional:  Negative for appetite change and fatigue.  HENT:  Negative for congestion, ear discharge and sinus pressure.   Eyes:  Negative for discharge.  Respiratory:  Negative for cough.   Cardiovascular:  Positive for chest pain.  Gastrointestinal:  Positive for abdominal pain. Negative for diarrhea.  Genitourinary:  Negative for frequency and hematuria.  Musculoskeletal:  Negative for back pain.  Skin:  Negative for rash.  Neurological:  Negative for seizures and headaches.  Psychiatric/Behavioral:  Negative for hallucinations.    Physical Exam Updated Vital Signs BP 132/86   Pulse 88   Temp 98.1 F (36.7 C) (Oral)   Resp 17   Ht 5\' 7"  (1.702 m)   Wt 108.4 kg   LMP 01/09/2021 (Approximate)   SpO2 100%   BMI 37.43 kg/m   Physical Exam Vitals and  nursing note reviewed.  Constitutional:      Appearance: She is well-developed.  HENT:     Head: Normocephalic.  Eyes:     General: No scleral icterus.    Conjunctiva/sclera: Conjunctivae normal.  Neck:     Thyroid: No thyromegaly.  Cardiovascular:     Rate and Rhythm: Normal rate and regular rhythm.     Heart sounds: No murmur heard.   No friction rub. No gallop.  Pulmonary:     Breath sounds: No stridor. No wheezing or rales.  Chest:     Chest wall: No tenderness.  Abdominal:     General: There is no distension.     Tenderness: There is abdominal tenderness. There is no rebound.  Musculoskeletal:        General: Normal range of motion.     Cervical back: Neck supple.  Lymphadenopathy:     Cervical: No cervical adenopathy.  Skin:    Findings: No erythema or rash.  Neurological:     Mental Status: She is alert and oriented to person, place, and time.     Motor: No abnormal muscle tone.     Coordination: Coordination normal.  Psychiatric:        Behavior: Behavior normal.    ED Results / Procedures / Treatments   Labs (all labs ordered are listed, but only abnormal results are displayed) Labs Reviewed  BASIC METABOLIC PANEL  CBC  HEPATIC FUNCTION PANEL  D-DIMER, QUANTITATIVE  TROPONIN I (HIGH SENSITIVITY)  TROPONIN I (HIGH SENSITIVITY)    EKG None  Radiology DG Chest 2 View  Result Date: 01/26/2021 CLINICAL DATA:  Chest pain for 2 weeks, shortness of breath today EXAM: CHEST - 2 VIEW COMPARISON:  01/17/2021 FINDINGS: The heart size and mediastinal contours are within normal limits. Both lungs are clear. The visualized skeletal structures are unremarkable. IMPRESSION: No active cardiopulmonary disease. Electronically Signed   By: Randa Ngo M.D.   On: 01/26/2021 18:21    Procedures Procedures   Medications Ordered in ED Medications - No data to display  ED Course  I have reviewed the triage vital signs and the nursing notes.  Pertinent labs &  imaging results that were available during my care of the patient were reviewed by me and considered in my medical decision making (see chart for details)   MDM Rules/Calculators/A&P        Patient's labs unremarkable.  She will be placed on protonic and follow-up with PCP                 {  Remember to document critical care time when appropriate:1 Final Clinical Impression(s) / ED Diagnoses Final diagnoses:  Atypical chest pain    Rx / DC Orders ED Discharge Orders          Ordered    pantoprazole (PROTONIX) 20 MG tablet  Daily        01/26/21 2100             Bethann Berkshire, MD 01/28/21 1040

## 2021-01-26 NOTE — Discharge Instructions (Signed)
Follow-up with your family doctor next week for recheck. 

## 2021-01-26 NOTE — ED Triage Notes (Signed)
Pt c/o constant chest and back pain for 2 weeks, but the intensity varies. Pt also states that today around 1300 her LT side of the face felt numb. States it still feels tingly.

## 2021-01-29 ENCOUNTER — Other Ambulatory Visit: Payer: Self-pay

## 2021-01-29 ENCOUNTER — Ambulatory Visit (INDEPENDENT_AMBULATORY_CARE_PROVIDER_SITE_OTHER): Payer: 59 | Admitting: Adult Health

## 2021-01-29 ENCOUNTER — Encounter: Payer: Self-pay | Admitting: Adult Health

## 2021-01-29 VITALS — BP 138/89 | HR 86 | Ht 67.0 in | Wt 237.2 lb

## 2021-01-29 DIAGNOSIS — R102 Pelvic and perineal pain: Secondary | ICD-10-CM | POA: Diagnosis not present

## 2021-01-29 NOTE — Progress Notes (Signed)
  Subjective:     Patient ID: Anna Andrews, female   DOB: 1995-12-18, 25 y.o.   MRN: [id number]  HPI Anna Andrews is a 25 year old black female,single, G1P1001, in for follow up after being seen in ER 01/22/21 for right sided pain, US showed small amount free fluid in pelvis,other wise normal. PCP is stephanie Hudnell NP  Lab Results  Component Value Date   DIAGPAP  06/02/2019    - Negative for intraepithelial lesion or malignancy (NILM)   HPVHIGH Negative 06/02/2019    Review of Systems Had pelvic pain that has resolved Some urge incontinence at times  Reviewed past medical,surgical, social and family history. Reviewed medications and allergies.     Objective:   Physical Exam BP 138/89 (BP Location: Right Arm, Patient Position: Sitting, Cuff Size: Normal)   Pulse 86   Ht 5\' 7"  (1.702 m)   Wt 237 lb 3.2 oz (107.6 kg)   LMP 01/09/2021 (Approximate)   Breastfeeding No   BMI 37.15 kg/m     Skin warm and dry. Neck: mid line trachea, normal thyroid, good ROM, no lymphadenopathy noted. Lungs: clear to ausculation bilaterally. Cardiovascular: regular rate and rhythm.   Upstream - 01/29/21 1514       Pregnancy Intention Screening   Does the patient want to become pregnant in the next year? No    Does the patient's partner want to become pregnant in the next year? No    Would the patient like to discuss contraceptive options today? No      Contraception Wrap Up   Current Method Withdrawal or Other Method;Female Condom    End Method Withdrawal or Other Method;Female Condom    Contraception Counseling Provided No             Assessment:    1. Pelvic pain,resolved Urine sent for GC/CHL and UA C&S - Urinalysis, Routine w reflex microscopic - Urine Culture - GC/Chlamydia Probe Amp     Plan:     Follow up prn

## 2021-01-29 NOTE — Progress Notes (Signed)
All labs are within normal range.  Glucose (sugar), electrolytes, kidney and liver function, blood count, thyroid, and cholesterol numbers are all good.   Keep up the good work with controlling your diet and continue to try and shoot for 30 minutes of exercise daily! 

## 2021-01-30 LAB — URINALYSIS, ROUTINE W REFLEX MICROSCOPIC
Bilirubin, UA: NEGATIVE
Glucose, UA: NEGATIVE
Ketones, UA: NEGATIVE
Nitrite, UA: NEGATIVE
Protein,UA: NEGATIVE
RBC, UA: NEGATIVE
Specific Gravity, UA: 1.007 (ref 1.005–1.030)
Urobilinogen, Ur: 0.2 mg/dL (ref 0.2–1.0)
pH, UA: 7 (ref 5.0–7.5)

## 2021-01-30 LAB — MICROSCOPIC EXAMINATION
Bacteria, UA: NONE SEEN
Casts: NONE SEEN /lpf
RBC, Urine: NONE SEEN /hpf (ref 0–2)

## 2021-02-02 LAB — URINE CULTURE

## 2021-02-03 LAB — GC/CHLAMYDIA PROBE AMP
Chlamydia trachomatis, NAA: NEGATIVE
Neisseria Gonorrhoeae by PCR: NEGATIVE

## 2021-02-04 ENCOUNTER — Ambulatory Visit
Admission: EM | Admit: 2021-02-04 | Discharge: 2021-02-04 | Disposition: A | Discharge status: Home / Self Care | Payer: 59 | Attending: Family Medicine | Admitting: Family Medicine

## 2021-02-04 ENCOUNTER — Other Ambulatory Visit: Payer: Self-pay

## 2021-02-04 DIAGNOSIS — J069 Acute upper respiratory infection, unspecified: Secondary | ICD-10-CM

## 2021-02-04 DIAGNOSIS — Z20828 Contact with and (suspected) exposure to other viral communicable diseases: Secondary | ICD-10-CM | POA: Diagnosis not present

## 2021-02-04 DIAGNOSIS — Z1152 Encounter for screening for COVID-19: Secondary | ICD-10-CM

## 2021-02-04 MED ORDER — PROMETHAZINE-DM 6.25-15 MG/5ML PO SYRP: 5.0000 mL | ORAL_SOLUTION | Freq: Four times a day (QID) | ORAL | 0 refills | Status: DC | PRN

## 2021-02-04 MED ORDER — OSELTAMIVIR PHOSPHATE 75 MG PO CAPS: 75.0000 mg | ORAL_CAPSULE | Freq: Two times a day (BID) | ORAL | 0 refills | Status: DC

## 2021-02-04 NOTE — ED Triage Notes (Signed)
Pt presents with nasal congestion since yesterday, family member positive for flu

## 2021-02-04 NOTE — ED Provider Notes (Signed)
[facility]    CSN: [id number] Arrival date & time: 02/04/21  1437      History   Chief Complaint Chief Complaint  Patient presents with   Nasal Congestion    HPI Anna Andrews is a 25 y.o. female.   Presenting today with 1 day history of congestion, cough, fatigue, body aches.  Denies known fever, chest pain, shortness of breath, abdominal pain, nausea vomiting or diarrhea.  Taking NyQuil with minimal relief.  Close exposure to influenza recently.  Son sick with similar symptoms.  No known pertinent chronic medical problems.   Past Medical History:  Diagnosis Date   Acid reflux    Anxiety    Eczema    Gestational hypertension 04/27/2019   Supervision of normal first pregnancy 10/19/2018     FAMILY TREE  LAB RESULTS  Language English Pap 08/20/16 neg  Initiated care at [facility] GC/CT Initial: neg/neg        36wks: neg/neg  Dating by LMP c/w 9wk U/S    Support person [employer] NT/IT/AFP/MaterniT21:declined    [place]/HgbE neg  Flu vaccine 12/22/18  CF declined  TDaP vaccine 02/16/19  SMA declined  Rhogam N/A Fragile X declined       Anatomy US Normal female Blood Type A/Positive/-- (08/11     Patient Active Problem List   Diagnosis Date Noted   Annual physical exam 01/25/2021   Pain and swelling of right lower leg 01/25/2021   Obesity (BMI 30-39.9) 01/25/2021   Anxiety 10/19/2018    Past Surgical History:  Procedure Laterality Date   NO PAST SURGERIES      OB History     Gravida  1   Para  1   Term  1   Preterm  0   AB  0   Living  1      SAB  0   IAB  0   Ectopic  0   Multiple  0   Live Births  1            Home Medications    Prior to Admission medications   Medication Sig Start Date End Date Taking? Authorizing Provider  oseltamivir (TAMIFLU) 75 MG capsule Take 1 capsule (75 mg total) by mouth every 12 (twelve) hours. 02/04/21  Yes Particia Nearing, PA-C  promethazine-dextromethorphan (PROMETHAZINE-DM) 6.25-15 MG/5ML syrup  Take 5 mLs by mouth 4 (four) times daily as needed. 02/04/21  Yes Particia Nearing, PA-C  hydrochlorothiazide (HYDRODIURIL) 12.5 MG tablet Take 1 tablet (12.5 mg total) by mouth in the morning. Patient not taking: Reported on 01/29/2021 01/25/21   Dulce Sellar, NP  hydrocortisone cream 1 % Apply 1 application topically 2 (two) times daily as needed for itching (For eczema.).    [provider]  pantoprazole (PROTONIX) 20 MG tablet Take 1 tablet (20 mg total) by mouth daily. 01/26/21   Bethann Berkshire, MD  sertraline (ZOLOFT) 50 MG tablet Take 1 tablet (50 mg total) by mouth daily. 05/03/20   Cresenzo-Dishmon, Scarlette Calico, CNM  enalapril (VASOTEC) 5 MG tablet Take 1 tablet (5 mg total) by mouth daily. Patient not taking: Reported on 06/02/2019 05/05/19 11/02/19  Jacklyn Shell, CNM    Family History Family History  Problem Relation Age of Onset   Hypertension Paternal Grandfather    CAD Paternal Grandmother    Hypertension Mother     Social History Social History   Tobacco Use   Smoking status: Former    Packs/day: 0.50  Types: Cigarettes   Smokeless tobacco: Never   Tobacco comments:    occ  Vaping Use   Vaping Use: Never used  Substance Use Topics   Alcohol use: No   Drug use: No     Allergies   Patient has no known allergies.   Review of Systems Review of Systems Per HPI  Physical Exam Triage Vital Signs ED Triage Vitals  Enc Vitals Group     BP 02/04/21 1908 135/84     Pulse Rate 02/04/21 1908 (!) 106     Resp 02/04/21 1908 20     Temp 02/04/21 1908 98.7 F (37.1 C)     Temp src --      SpO2 02/04/21 1908 98 %     Weight --      Height --      Head Circumference --      Peak Flow --      Pain Score 02/04/21 1907 0     Pain Loc --      Pain Edu? --      Excl. in [place]? --    No data found.  Updated Vital Signs BP 135/84   Pulse (!) 106   Temp 98.7 F (37.1 C)   Resp 20   LMP 01/09/2021 (Approximate)   SpO2 98%   Visual  Acuity Right Eye Distance:   Left Eye Distance:   Bilateral Distance:    Right Eye Near:   Left Eye Near:    Bilateral Near:     Physical Exam Vitals and nursing note reviewed.  Constitutional:      Appearance: Normal appearance.  HENT:     Head: Atraumatic.     Right Ear: Tympanic membrane and external ear normal.     Left Ear: Tympanic membrane and external ear normal.     Nose: Rhinorrhea present.     Mouth/Throat:     Mouth: Mucous membranes are moist.     Pharynx: Posterior oropharyngeal erythema present.  Eyes:     Extraocular Movements: Extraocular movements intact.     Conjunctiva/sclera: Conjunctivae normal.  Cardiovascular:     Rate and Rhythm: Normal rate and regular rhythm.     Heart sounds: Normal heart sounds.  Pulmonary:     Effort: Pulmonary effort is normal.     Breath sounds: Normal breath sounds. No wheezing or rales.  Musculoskeletal:        General: Normal range of motion.     Cervical back: Normal range of motion and neck supple.  Skin:    General: Skin is warm and dry.  Neurological:     Mental Status: She is alert and oriented to person, place, and time.  Psychiatric:        Mood and Affect: Mood normal.        Thought Content: Thought content normal.     UC Treatments / Results  Labs (all labs ordered are listed, but only abnormal results are displayed) Labs Reviewed  COVID-19, FLU A+B NAA    EKG   Radiology No results found.  Procedures Procedures (including critical care time)  Medications Ordered in UC Medications - No data to display  Initial Impression / Assessment and Plan / UC Course  I have reviewed the triage vital signs and the nursing notes.  Pertinent labs & imaging results that were available during my care of the patient were reviewed by me and considered in my medical decision making (see chart for details).  COVID and flu testing pending, strong suspicion for influenza given her symptoms and close  exposure.  Will start Tamiflu proactively while awaiting results, also sent Phenergan DM for symptomatic benefit and discussed over-the-counter supportive medications and home care.  Return for acutely worsening symptoms.  Final Clinical Impressions(s) / UC Diagnoses   Final diagnoses:  Viral URI with cough  Exposure to influenza   Discharge Instructions   None    ED Prescriptions     Medication Sig Dispense Auth. Provider   oseltamivir (TAMIFLU) 75 MG capsule Take 1 capsule (75 mg total) by mouth every 12 (twelve) hours. 10 capsule Volney American, [place]   promethazine-dextromethorphan (PROMETHAZINE-DM) 6.25-15 MG/5ML syrup Take 5 mLs by mouth 4 (four) times daily as needed. 100 mL Volney American, [place]      PDMP not reviewed this encounter.   Volney American, [place] 02/04/21 1936

## 2021-02-05 LAB — COVID-19, FLU A+B NAA
Influenza A, NAA: DETECTED — AB
Influenza B, NAA: NOT DETECTED
SARS-CoV-2, NAA: NOT DETECTED

## 2021-02-07 DIAGNOSIS — Z419 Encounter for procedure for purposes other than remedying health state, unspecified: Secondary | ICD-10-CM | POA: Diagnosis not present

## 2021-02-12 ENCOUNTER — Other Ambulatory Visit: Payer: Self-pay

## 2021-02-12 ENCOUNTER — Ambulatory Visit
Admission: EM | Admit: 2021-02-12 | Discharge: 2021-02-12 | Disposition: A | Discharge status: Home / Self Care | Payer: 59

## 2021-02-12 ENCOUNTER — Encounter: Payer: Self-pay | Admitting: Emergency Medicine

## 2021-02-12 DIAGNOSIS — B349 Viral infection, unspecified: Secondary | ICD-10-CM | POA: Diagnosis not present

## 2021-02-12 NOTE — Discharge Instructions (Addendum)
Tylenol every 4 hours.  Pseudoephedrine for congestion

## 2021-02-12 NOTE — ED Provider Notes (Signed)
[facility]    CSN: [id number] Arrival date & time: 02/12/21  0805      History   Chief Complaint Chief Complaint  Patient presents with   Nasal Congestion    HPI Anna Andrews is a 25 y.o. female.   Pt complains of congestion and pain in left ear.  Pt had flu last week   The history is provided by the patient. No language interpreter was used.  Otalgia Location:  Left Quality:  Aching Severity:  Moderate Onset quality:  Gradual Timing:  Constant Progression:  Worsening Chronicity:  New Relieved by:  Nothing Worsened by:  Nothing Ineffective treatments:  None tried Associated symptoms: congestion   Risk factors: no chronic ear infection    Past Medical History:  Diagnosis Date   Acid reflux    Anxiety    Eczema    Gestational hypertension 04/27/2019   Supervision of normal first pregnancy 10/19/2018     FAMILY TREE  LAB RESULTS  Language English Pap 08/20/16 neg  Initiated care at [facility] GC/CT Initial: neg/neg        36wks: neg/neg  Dating by LMP c/w 9wk U/S    Support person [employer] NT/IT/AFP/MaterniT21:declined    [place]/HgbE neg  Flu vaccine 12/22/18  CF declined  TDaP vaccine 02/16/19  SMA declined  Rhogam N/A Fragile X declined       Anatomy US Normal female Blood Type A/Positive/-- (08/11     Patient Active Problem List   Diagnosis Date Noted   Annual physical exam 01/25/2021   Pain and swelling of right lower leg 01/25/2021   Obesity (BMI 30-39.9) 01/25/2021   Anxiety 10/19/2018    Past Surgical History:  Procedure Laterality Date   NO PAST SURGERIES      OB History     Gravida  1   Para  1   Term  1   Preterm  0   AB  0   Living  1      SAB  0   IAB  0   Ectopic  0   Multiple  0   Live Births  1            Home Medications    Prior to Admission medications   Medication Sig Start Date End Date Taking? Authorizing Provider  hydrochlorothiazide (HYDRODIURIL) 12.5 MG tablet Take 1 tablet (12.5 mg total) by  mouth in the morning. Patient not taking: Reported on 01/29/2021 01/25/21   Dulce Sellar, NP  hydrocortisone cream 1 % Apply 1 application topically 2 (two) times daily as needed for itching (For eczema.).    [provider]  oseltamivir (TAMIFLU) 75 MG capsule Take 1 capsule (75 mg total) by mouth every 12 (twelve) hours. Patient not taking: Reported on 02/12/2021 02/04/21   Particia Nearing, PA-C  pantoprazole (PROTONIX) 20 MG tablet Take 1 tablet (20 mg total) by mouth daily. Patient not taking: Reported on 02/12/2021 01/26/21   Bethann Berkshire, MD  promethazine-dextromethorphan (PROMETHAZINE-DM) 6.25-15 MG/5ML syrup Take 5 mLs by mouth 4 (four) times daily as needed. 02/04/21   Particia Nearing, PA-C  sertraline (ZOLOFT) 50 MG tablet Take 1 tablet (50 mg total) by mouth daily. 05/03/20   Cresenzo-Dishmon, Scarlette Calico, CNM  enalapril (VASOTEC) 5 MG tablet Take 1 tablet (5 mg total) by mouth daily. Patient not taking: Reported on 06/02/2019 05/05/19 11/02/19  Jacklyn Shell, CNM    Family History Family History  Problem Relation Age of Onset  Hypertension Paternal Grandfather    CAD Paternal Grandmother    Hypertension Mother     Social History Social History   Tobacco Use   Smoking status: Former    Packs/day: 0.50    Types: Cigarettes   Smokeless tobacco: Never   Tobacco comments:    occ  Vaping Use   Vaping Use: Never used  Substance Use Topics   Alcohol use: No   Drug use: No     Allergies   Patient has no known allergies.   Review of Systems Review of Systems  HENT:  Positive for congestion and ear pain.   All other systems reviewed and are negative.   Physical Exam Triage Vital Signs ED Triage Vitals  Enc Vitals Group     BP --      Pulse Rate 02/12/21 0825 87     Resp 02/12/21 0825 18     Temp 02/12/21 0825 97.6 F (36.4 C)     Temp Source 02/12/21 0825 Oral     SpO2 02/12/21 0825 97 %     Weight 02/12/21 0826 239 lb  (108.4 kg)     Height 02/12/21 0826 5\' 7"  (1.702 m)     Head Circumference --      Peak Flow --      Pain Score 02/12/21 0825 6     Pain Loc --      Pain Edu? --      Excl. in [place]? --    No data found.  Updated Vital Signs Pulse 87   Temp 97.6 F (36.4 C) (Oral)   Resp 18   Ht 5\' 7"  (1.702 m)   Wt 108.4 kg   LMP 02/12/2021   SpO2 97%   BMI 37.43 kg/m   Visual Acuity Right Eye Distance:   Left Eye Distance:   Bilateral Distance:    Right Eye Near:   Left Eye Near:    Bilateral Near:     Physical Exam Vitals and nursing note reviewed.  Constitutional:      Appearance: She is well-developed.  HENT:     Head: Normocephalic.     Right Ear: Tympanic membrane normal.     Left Ear: Tympanic membrane normal.     Nose: Nose normal.     Mouth/Throat:     Mouth: Mucous membranes are moist.  Cardiovascular:     Rate and Rhythm: Normal rate.  Pulmonary:     Effort: Pulmonary effort is normal.  Abdominal:     General: There is no distension.  Musculoskeletal:        General: Normal range of motion.     Cervical back: Normal range of motion.  Neurological:     Mental Status: She is alert and oriented to person, place, and time.     UC Treatments / Results  Labs (all labs ordered are listed, but only abnormal results are displayed) Labs Reviewed - No data to display  EKG   Radiology No results found.  Procedures Procedures (including critical care time)  Medications Ordered in UC Medications - No data to display  Initial Impression / Assessment and Plan / UC Course  I have reviewed the triage vital signs and the nursing notes.  Pertinent labs & imaging results that were available during my care of the patient were reviewed by me and considered in my medical decision making (see chart for details).     MDM:  Pt advised tylenol, try pseudofed for congestion  Final  Clinical Impressions(s) / UC Diagnoses   Final diagnoses:  Viral illness      Discharge Instructions      Tylenol every 4 hours.  Pseudoephedrine for congestion     ED Prescriptions   None    PDMP not reviewed this encounter.   Fransico Meadow, [place] 02/12/21 0900

## 2021-02-12 NOTE — ED Triage Notes (Signed)
Pt reports was diagnosed with flu on Monday and reports cough, nasal/chest congestion for last several days.

## 2021-02-14 ENCOUNTER — Ambulatory Visit (INDEPENDENT_AMBULATORY_CARE_PROVIDER_SITE_OTHER): Payer: 59 | Admitting: Family

## 2021-02-14 ENCOUNTER — Other Ambulatory Visit: Payer: Self-pay

## 2021-02-14 ENCOUNTER — Ambulatory Visit: Payer: 59 | Admitting: Family

## 2021-02-14 ENCOUNTER — Encounter: Payer: Self-pay | Admitting: Family

## 2021-02-14 VITALS — BP 121/85 | HR 85 | Temp 97.6°F | Ht 67.0 in | Wt 236.2 lb

## 2021-02-14 DIAGNOSIS — J111 Influenza due to unidentified influenza virus with other respiratory manifestations: Secondary | ICD-10-CM

## 2021-02-14 DIAGNOSIS — M79661 Pain in right lower leg: Secondary | ICD-10-CM

## 2021-02-14 DIAGNOSIS — M7989 Other specified soft tissue disorders: Secondary | ICD-10-CM | POA: Diagnosis not present

## 2021-02-14 HISTORY — DX: Influenza due to unidentified influenza virus with other respiratory manifestations: J11.1

## 2021-02-14 NOTE — Progress Notes (Signed)
daily!  Subjective:     Patient ID: Anna Andrews, female    DOB: [birth year], 25 y.o.   MRN: [id number]  Chief Complaint  Patient presents with   Nasal Congestion    Pt recently had the flu, and says these symptoms started afterwards. Started 2 weeks ago with the flu.   Headache   Fatigue    Headache   Edema: Patient complains of edema and pain. The location of the edema is lower leg(s) right.  The edema has been moderate and localized.  Onset of symptoms was a few months ago, unchanged since that time. The edema is present intermittently, upon awakening, in the afternoon, in the evening, at bedtime, and during sleep. The patient states the problem is intermittent for a few months.  The swelling has been aggravated by dependency of involved area, increased salt intake, and standing for long periods, relieved by rest, and been associated with nothing. Cardiac risk factors include obesity (BMI >= 30 kg/m2).  Upper Respiratory Infection: Symptoms include nasal congestion, no  fever, sore throat, and nasal drainage, fatigue, and chest tightness .  Onset of symptoms was 10 days ago, gradually improving since that time. She is drinking moderate amounts of fluids. Evaluation to date: seen previously and thought to have a viral URI, tested positive for Flu.  Treatment to date:  Tamiflu .   Health Maintenance Due  Topic Date Due   Hepatitis C Screening  Never done   COVID-19 Vaccine (2 - Moderna series) 10/24/2019    Past Medical History:  Diagnosis Date   Acid reflux    Anxiety    Eczema    Gestational hypertension 04/27/2019   Supervision of normal first pregnancy 10/19/2018     FAMILY TREE  LAB RESULTS  Language English Pap 08/20/16 neg  Initiated care at [facility] GC/CT Initial: neg/neg        36wks: neg/neg  Dating by LMP c/w 9wk U/S    Support person [facility] NT/IT/AFP/MaterniT21:declined    [place]/HgbE neg  Flu vaccine 12/22/18  CF declined  TDaP vaccine 02/16/19  SMA declined  Rhogam N/A  Fragile X declined       Anatomy US Normal female Blood Type A/Positive/-- (08/11     Past Surgical History:  Procedure Laterality Date   NO PAST SURGERIES      Outpatient Medications Prior to Visit  Medication Sig Dispense Refill   hydrocortisone cream 1 % Apply 1 application topically 2 (two) times daily as needed for itching (For eczema.).     sertraline (ZOLOFT) 50 MG tablet Take 1 tablet (50 mg total) by mouth daily. 30 tablet 11   hydrochlorothiazide (HYDRODIURIL) 12.5 MG tablet Take 1 tablet (12.5 mg total) by mouth in the morning. (Patient not taking: Reported on 01/29/2021) 30 tablet 1   oseltamivir (TAMIFLU) 75 MG capsule Take 1 capsule (75 mg total) by mouth every 12 (twelve) hours. (Patient not taking: Reported on 02/12/2021) 10 capsule 0   pantoprazole (PROTONIX) 20 MG tablet Take 1 tablet (20 mg total) by mouth daily. (Patient not taking: Reported on 02/12/2021) 30 tablet 0   promethazine-dextromethorphan (PROMETHAZINE-DM) 6.25-15 MG/5ML syrup Take 5 mLs by mouth 4 (four) times daily as needed. (Patient not taking: Reported on 02/14/2021) 100 mL 0   No facility-administered medications prior to visit.    No Known Allergies      Objective:    Physical Exam Vitals and nursing note reviewed.  Constitutional:      Appearance: Normal  appearance.  HENT:     Right Ear: Tympanic membrane and ear canal normal.     Left Ear: Ear canal normal. A middle ear effusion (mild) is present.     Mouth/Throat:     Mouth: Mucous membranes are moist.     Pharynx: No pharyngeal swelling, oropharyngeal exudate or posterior oropharyngeal erythema.     Tonsils: No tonsillar exudate or tonsillar abscesses.  Cardiovascular:     Rate and Rhythm: Normal rate and regular rhythm.  Pulmonary:     Effort: Pulmonary effort is normal.     Breath sounds: Normal breath sounds.  Musculoskeletal:        General: Normal range of motion.  Skin:    General: Skin is warm and dry.  Neurological:      Mental Status: She is alert.  Psychiatric:        Mood and Affect: Mood normal.        Behavior: Behavior normal.    BP 121/85   Pulse 85   Temp 97.6 F (36.4 C) (Temporal)   Ht 5\' 7"  (1.702 m)   Wt 236 lb 3.2 oz (107.1 kg)   LMP 02/12/2021   SpO2 98%   BMI 36.99 kg/m  Wt Readings from Last 3 Encounters:  02/14/21 236 lb 3.2 oz (107.1 kg)  02/12/21 239 lb (108.4 kg)  01/29/21 237 lb 3.2 oz (107.6 kg)       Assessment & Plan:   Problem List Items Addressed This Visit       Respiratory   Influenza - Primary    Pt tested in ER over a week ago, still c/o sinus drainage, fatigue, pleuritic cp, pt had not received flu shot, advised it can take up to 2 mos to completely recover, she should get the vaccine when feeling better. Advised on using generic Ibuprofen 600mg  tid, or Naproxen 1-2 tabs bid. Plenty of fluids.         Other   Pain and swelling of right lower leg    Pt reports never taking the HCTZ, she started wearing light compression socks, exercising more and her symptoms are improved.

## 2021-02-14 NOTE — Assessment & Plan Note (Signed)
Pt reports never taking the HCTZ, she started wearing light compression socks, exercising more and her symptoms are improved.

## 2021-02-14 NOTE — Assessment & Plan Note (Signed)
Pt tested in ER over a week ago, still c/o sinus drainage, fatigue, pleuritic cp, pt had not received flu shot, advised it can take up to 2 mos to completely recover, she should get the vaccine when feeling better. Advised on using generic Ibuprofen 600mg  tid, or Naproxen 1-2 tabs bid. Plenty of fluids.

## 2021-02-22 ENCOUNTER — Ambulatory Visit (INDEPENDENT_AMBULATORY_CARE_PROVIDER_SITE_OTHER): Payer: 59

## 2021-02-22 ENCOUNTER — Ambulatory Visit
Admission: EM | Admit: 2021-02-22 | Discharge: 2021-02-22 | Disposition: A | Discharge status: Home / Self Care | Payer: 59 | Attending: Family Medicine | Admitting: Family Medicine

## 2021-02-22 ENCOUNTER — Encounter: Payer: Self-pay | Admitting: Emergency Medicine

## 2021-02-22 ENCOUNTER — Other Ambulatory Visit: Payer: Self-pay

## 2021-02-22 DIAGNOSIS — J029 Acute pharyngitis, unspecified: Secondary | ICD-10-CM

## 2021-02-22 DIAGNOSIS — R059 Cough, unspecified: Secondary | ICD-10-CM

## 2021-02-22 DIAGNOSIS — J069 Acute upper respiratory infection, unspecified: Secondary | ICD-10-CM

## 2021-02-22 DIAGNOSIS — R079 Chest pain, unspecified: Secondary | ICD-10-CM | POA: Diagnosis not present

## 2021-02-22 MED ORDER — LIDOCAINE VISCOUS HCL 2 % MT SOLN: 10.0000 mL | OROMUCOSAL | 0 refills | Status: DC | PRN

## 2021-02-22 MED ORDER — PREDNISONE 20 MG PO TABS: 40.0000 mg | ORAL_TABLET | Freq: Every day | ORAL | 0 refills | Status: DC

## 2021-02-22 MED ORDER — ALBUTEROL SULFATE HFA 108 (90 BASE) MCG/ACT IN AERS: 1.0000 | INHALATION_SPRAY | Freq: Four times a day (QID) | RESPIRATORY_TRACT | 0 refills | Status: DC | PRN

## 2021-02-22 NOTE — ED Provider Notes (Signed)
[facility]    CSN: [id number] Arrival date & time: 02/22/21  1208      History   Chief Complaint No chief complaint on file.  HPI Anna Andrews is a 25 y.o. female.   Presenting today with 3-day history of productive cough, sore throat, chest tightness that she states has been lingering since she had influenza a several weeks ago.  She denies recent fever, chills, chest pain, shortness of breath, abdominal pain, nausea vomiting or diarrhea.  No known sick contacts recently.  Trying Phenergan DM cough syrup since onset of the symptoms with no full resolution.  Does have a history of seasonal allergies, states she takes antihistamines as needed for this.   Past Medical History:  Diagnosis Date   Acid reflux    Anxiety    Eczema    Gestational hypertension 04/27/2019   Supervision of normal first pregnancy 10/19/2018     FAMILY TREE  LAB RESULTS  Language English Pap 08/20/16 neg  Initiated care at [facility] GC/CT Initial: neg/neg        36wks: neg/neg  Dating by LMP c/w 9wk U/S    Support person [facility] NT/IT/AFP/MaterniT21:declined    [place]/HgbE neg  Flu vaccine 12/22/18  CF declined  TDaP vaccine 02/16/19  SMA declined  Rhogam N/A Fragile X declined       Anatomy US Normal female Blood Type A/Positive/-- (08/11     Patient Active Problem List   Diagnosis Date Noted   Influenza 02/14/2021   Annual physical exam 01/25/2021   Pain and swelling of right lower leg 01/25/2021   Obesity (BMI 30-39.9) 01/25/2021   Anxiety 10/19/2018    Past Surgical History:  Procedure Laterality Date   NO PAST SURGERIES      OB History     Gravida  1   Para  1   Term  1   Preterm  0   AB  0   Living  1      SAB  0   IAB  0   Ectopic  0   Multiple  0   Live Births  1           Home Medications    Prior to Admission medications   Medication Sig Start Date End Date Taking? Authorizing Provider  albuterol (VENTOLIN HFA) 108 (90 Base) MCG/ACT inhaler Inhale  1-2 puffs into the lungs every 6 (six) hours as needed for wheezing or shortness of breath. 02/22/21  Yes Volney American, PA-C  lidocaine (XYLOCAINE) 2 % solution Use as directed 10 mLs in the mouth or throat as needed for mouth pain. 02/22/21  Yes Volney American, PA-C  predniSONE (DELTASONE) 20 MG tablet Take 2 tablets (40 mg total) by mouth daily with breakfast. 02/22/21  Yes Volney American, PA-C  hydrochlorothiazide (HYDRODIURIL) 12.5 MG tablet Take 1 tablet (12.5 mg total) by mouth in the morning. Patient not taking: Reported on 01/29/2021 01/25/21   Jeanie Sewer, NP  hydrocortisone cream 1 % Apply 1 application topically 2 (two) times daily as needed for itching (For eczema.).    [provider]  oseltamivir (TAMIFLU) 75 MG capsule Take 1 capsule (75 mg total) by mouth every 12 (twelve) hours. Patient not taking: Reported on 02/12/2021 02/04/21   Volney American, PA-C  pantoprazole (PROTONIX) 20 MG tablet Take 1 tablet (20 mg total) by mouth daily. Patient not taking: Reported on 02/12/2021 01/26/21   Milton Ferguson, MD  promethazine-dextromethorphan (PROMETHAZINE-DM)  6.25-15 MG/5ML syrup Take 5 mLs by mouth 4 (four) times daily as needed. Patient not taking: Reported on 02/14/2021 02/04/21   Particia Nearing, PA-C  sertraline (ZOLOFT) 50 MG tablet Take 1 tablet (50 mg total) by mouth daily. 05/03/20   Cresenzo-Dishmon, Scarlette Calico, CNM  enalapril (VASOTEC) 5 MG tablet Take 1 tablet (5 mg total) by mouth daily. Patient not taking: Reported on 06/02/2019 05/05/19 11/02/19  Jacklyn Shell, CNM    Family History Family History  Problem Relation Age of Onset   Hypertension Paternal Grandfather    CAD Paternal Grandmother    Hypertension Mother     Social History Social History   Tobacco Use   Smoking status: Former    Packs/day: 0.50    Types: Cigarettes   Smokeless tobacco: Never   Tobacco comments:    occ  Vaping Use   Vaping  Use: Never used  Substance Use Topics   Alcohol use: No   Drug use: No     Allergies   Patient has no known allergies.   Review of Systems Review of Systems Per HPI  Physical Exam Triage Vital Signs ED Triage Vitals  Enc Vitals Group     BP 02/22/21 1229 125/86     Pulse Rate 02/22/21 1229 95     Resp 02/22/21 1229 18     Temp 02/22/21 1229 97.8 F (36.6 C)     Temp Source 02/22/21 1229 Oral     SpO2 02/22/21 1229 98 %     Weight --      Height --      Head Circumference --      Peak Flow --      Pain Score 02/22/21 1230 8     Pain Loc --      Pain Edu? --      Excl. in GC? --    No data found.  Updated Vital Signs BP 125/86 (BP Location: Right Arm)    Pulse 95    Temp 97.8 F (36.6 C) (Oral)    Resp 18    LMP 02/12/2021    SpO2 98%   Visual Acuity Right Eye Distance:   Left Eye Distance:   Bilateral Distance:    Right Eye Near:   Left Eye Near:    Bilateral Near:     Physical Exam Vitals and nursing note reviewed.  Constitutional:      Appearance: Normal appearance.  HENT:     Head: Atraumatic.     Right Ear: Tympanic membrane and external ear normal.     Left Ear: Tympanic membrane and external ear normal.     Nose: Rhinorrhea present.     Mouth/Throat:     Mouth: Mucous membranes are moist.     Pharynx: Posterior oropharyngeal erythema present. No oropharyngeal exudate.  Eyes:     Extraocular Movements: Extraocular movements intact.     Conjunctiva/sclera: Conjunctivae normal.  Cardiovascular:     Rate and Rhythm: Normal rate and regular rhythm.     Heart sounds: Normal heart sounds.  Pulmonary:     Effort: Pulmonary effort is normal.     Breath sounds: Normal breath sounds. No wheezing or rales.  Musculoskeletal:        General: Normal range of motion.     Cervical back: Normal range of motion and neck supple.  Skin:    General: Skin is warm and dry.  Neurological:     Mental Status: She is alert and oriented to  person, place, and time.   Psychiatric:        Mood and Affect: Mood normal.        Thought Content: Thought content normal.     UC Treatments / Results  Labs (all labs ordered are listed, but only abnormal results are displayed) Labs Reviewed - No data to display  EKG   Radiology DG Chest 2 View  Result Date: 02/22/2021 CLINICAL DATA:  25 year old female with sore throat cough and chest tightness. EXAM: CHEST - 2 VIEW COMPARISON:  January 26, 2021. FINDINGS: The heart size and mediastinal contours are within normal limits. Both lungs are clear. The visualized skeletal structures are unremarkable. IMPRESSION: Normal chest. Electronically Signed   By: Zetta Bills M.D.   On: 02/22/2021 13:38    Procedures Procedures (including critical care time)  Medications Ordered in UC Medications - No data to display  Initial Impression / Assessment and Plan / UC Course  I have reviewed the triage vital signs and the nursing notes.  Pertinent labs & imaging results that were available during my care of the patient were reviewed by me and considered in my medical decision making (see chart for details).     Suspect viral versus ongoing allergic symptoms.  Vital signs benign, exam overall reassuring today.  Patient adamantly requesting a chest x-ray today, discussed that her oxygen saturation was 98% on room air and her lungs were clear to auscultation bilaterally but she wishes to have 1 for reassurance given the chest tightness that she has been having ongoing.  This was negative for any acute cardiopulmonary abnormality today.  We will provide an albuterol inhaler to see if this helps with her chest tightness, viscous lidocaine, short course of prednisone.  She states she has plenty of Phenergan DM at home for cough relief.  Declines viral testing today as she just had flu and does not feel that she has COVID.  Follow-up for any worsening symptoms.  Final Clinical Impressions(s) / UC Diagnoses   Final diagnoses:   Viral URI  Sore throat   Discharge Instructions   None    ED Prescriptions     Medication Sig Dispense Auth. Provider   lidocaine (XYLOCAINE) 2 % solution Use as directed 10 mLs in the mouth or throat as needed for mouth pain. 100 mL Volney American, PA-C   predniSONE (DELTASONE) 20 MG tablet Take 2 tablets (40 mg total) by mouth daily with breakfast. 10 tablet Volney American, PA-C   albuterol (VENTOLIN HFA) 108 (90 Base) MCG/ACT inhaler Inhale 1-2 puffs into the lungs every 6 (six) hours as needed for wheezing or shortness of breath. 18 g Volney American, [place]      PDMP not reviewed this encounter.   Volney American, [place] 02/22/21 1349

## 2021-02-22 NOTE — ED Triage Notes (Signed)
Sore throat x 3 days, productive cough with greenish color sputum.

## 2021-02-26 ENCOUNTER — Ambulatory Visit: Payer: 59 | Admitting: Family

## 2021-03-10 DIAGNOSIS — Z419 Encounter for procedure for purposes other than remedying health state, unspecified: Secondary | ICD-10-CM | POA: Diagnosis not present

## 2021-03-11 ENCOUNTER — Other Ambulatory Visit: Payer: Self-pay

## 2021-03-11 ENCOUNTER — Emergency Department (HOSPITAL_COMMUNITY)
Admission: EM | Admit: 2021-03-11 | Discharge: 2021-03-11 | Disposition: A | Discharge status: Home / Self Care | Payer: 59 | Attending: Emergency Medicine | Admitting: Emergency Medicine

## 2021-03-11 DIAGNOSIS — T7840XA Allergy, unspecified, initial encounter: Secondary | ICD-10-CM | POA: Diagnosis not present

## 2021-03-11 MED ORDER — EPINEPHRINE 0.3 MG/0.3ML IJ SOAJ: 0.3000 mg | INTRAMUSCULAR | 1 refills | Status: AC | PRN

## 2021-03-11 MED ORDER — DIPHENHYDRAMINE HCL 25 MG PO CAPS
25.0000 mg | ORAL_CAPSULE | Freq: Once | ORAL | Status: AC
  Administered 2021-03-11: 25 mg via ORAL
  Filled 2021-03-11: qty 1

## 2021-03-11 MED ORDER — FAMOTIDINE 20 MG PO TABS
20.0000 mg | ORAL_TABLET | Freq: Once | ORAL | Status: AC
  Administered 2021-03-11: 20 mg via ORAL
  Filled 2021-03-11: qty 1

## 2021-03-11 MED ORDER — PREDNISONE 50 MG PO TABS
60.0000 mg | ORAL_TABLET | Freq: Once | ORAL | Status: AC
  Administered 2021-03-11: 60 mg via ORAL
  Filled 2021-03-11: qty 1

## 2021-03-11 NOTE — ED Provider Notes (Signed)
[facility] EMERGENCY DEPARTMENT Provider Note   CSN: [id number] Arrival date & time: 03/11/21  0006     History  Chief Complaint  Patient presents with   Allergic Reaction    Anna Andrews is a 26 y.o. female.  Patient states that she drank a fruit smoothie and shortly afterwards she has some tingling and swelling of her lower lip.  She took some Benadryl but then notes that she had some scratchy throat and thought he should get evaluated for any kind of allergic reaction.  She had no nausea, vomiting, lightheadedness, syncope, diarrhea or other associated symptoms.  She has no history of allergies that she knows of.  She thinks it might been frozen pineapple that caused this but she has had pineapple multiple times in the past.  No other new allergens that she knows of.   Allergic Reaction     Home Medications Prior to Admission medications   Medication Sig Start Date End Date Taking? Authorizing Provider  EPINEPHrine 0.3 mg/0.3 mL IJ SOAJ injection Inject 0.3 mg into the muscle as needed for anaphylaxis. 03/11/21  Yes Lasheika Ortloff, Barbara Cower, MD  albuterol (VENTOLIN HFA) 108 (90 Base) MCG/ACT inhaler Inhale 1-2 puffs into the lungs every 6 (six) hours as needed for wheezing or shortness of breath. 02/22/21   Particia Nearing, PA-C  hydrochlorothiazide (HYDRODIURIL) 12.5 MG tablet Take 1 tablet (12.5 mg total) by mouth in the morning. Patient not taking: Reported on 01/29/2021 01/25/21   Dulce Sellar, NP  hydrocortisone cream 1 % Apply 1 application topically 2 (two) times daily as needed for itching (For eczema.).    [provider]  lidocaine (XYLOCAINE) 2 % solution Use as directed 10 mLs in the mouth or throat as needed for mouth pain. 02/22/21   Particia Nearing, PA-C  predniSONE (DELTASONE) 20 MG tablet Take 2 tablets (40 mg total) by mouth daily with breakfast. 02/22/21   Particia Nearing, PA-C  sertraline (ZOLOFT) 50 MG tablet Take 1 tablet (50 mg  total) by mouth daily. 05/03/20   Cresenzo-Dishmon, Scarlette Calico, CNM  enalapril (VASOTEC) 5 MG tablet Take 1 tablet (5 mg total) by mouth daily. Patient not taking: Reported on 06/02/2019 05/05/19 11/02/19  Jacklyn Shell, CNM      Allergies    Patient has no known allergies.    Review of Systems   Review of Systems  Physical Exam Updated Vital Signs BP 136/82    Pulse 71    Temp 98.4 F (36.9 C) (Oral)    Resp 18    Wt 104.3 kg    LMP 02/12/2021    SpO2 98%    BMI 36.02 kg/m  Physical Exam Vitals and nursing note reviewed.  Constitutional:      Appearance: She is well-developed.  HENT:     Head: Normocephalic and atraumatic.     Nose: Nose normal. No congestion or rhinorrhea.     Mouth/Throat:     Mouth: Mucous membranes are moist.     Pharynx: Oropharynx is clear.  Eyes:     Pupils: Pupils are equal, round, and reactive to light.  Cardiovascular:     Rate and Rhythm: Normal rate and regular rhythm.     Heart sounds: No murmur heard. Pulmonary:     Effort: Pulmonary effort is normal. No respiratory distress.     Breath sounds: No stridor.  Abdominal:     General: Abdomen is flat. There is no distension.  Musculoskeletal:  General: No swelling or tenderness. Normal range of motion.     Cervical back: Normal range of motion.  Skin:    General: Skin is warm and dry.  Neurological:     General: No focal deficit present.     Mental Status: She is alert.    ED Results / Procedures / Treatments   Labs (all labs ordered are listed, but only abnormal results are displayed) Labs Reviewed - No data to display  EKG None  Radiology No results found.  Procedures Procedures    Medications Ordered in ED Medications  predniSONE (DELTASONE) tablet 60 mg (60 mg Oral Given 03/11/21 0140)  famotidine (PEPCID) tablet 20 mg (20 mg Oral Given 03/11/21 0140)  diphenhydrAMINE (BENADRYL) capsule 25 mg (25 mg Oral Given 03/11/21 0140)    ED Course/ Medical Decision Making/  A&P Clinical Course as of 03/11/21 0348  Mon Mar 11, 2021  0132 SpO2: 98 % [JM]  0132 Pulse Rate: 70 [JM]  0132 BP: 126/73 No evidence of anaphylaxis [JM]    Clinical Course User Index [JM] Stepahnie Campo, Barbara Cower, MD                           Medical Decision Making  Patient with mild lower lip edema.  No stridor, wheezing or hypotension to suggest anaphylaxis.  Patient observed after steroids and Pepcid for about an hour and she had persistent improvement in her symptoms.  Still no evidence of anaphylaxis.  Patient appears to be stable for discharge.  Will refer to allergy and immunology as needed.  Benadryl as needed at home.  Return here for any new or worsening symptoms.  EpiPen provided in case she has anaphylactic reaction which I described to her.   Final Clinical Impression(s) / ED Diagnoses Final diagnoses:  Allergic reaction, initial encounter    Rx / DC Orders ED Discharge Orders          Ordered    EPINEPHrine 0.3 mg/0.3 mL IJ SOAJ injection  As needed        03/11/21 0249              Alexsus Papadopoulos, Barbara Cower, MD 03/11/21 [phone]

## 2021-03-11 NOTE — ED Triage Notes (Signed)
Pt arrives with c/o allergic reaction. Per pt, she had a smoothie before bed and then her bottom lip started to swell. Pt does endorse itching on her face/neck and also itching in her throat. Pt is talking in complete sentences in triage.

## 2021-03-13 ENCOUNTER — Ambulatory Visit
Admission: EM | Admit: 2021-03-13 | Discharge: 2021-03-13 | Disposition: A | Discharge status: Home / Self Care | Payer: Medicaid Other | Attending: Family Medicine | Admitting: Family Medicine

## 2021-03-13 ENCOUNTER — Other Ambulatory Visit: Payer: Self-pay

## 2021-03-13 DIAGNOSIS — H66002 Acute suppurative otitis media without spontaneous rupture of ear drum, left ear: Secondary | ICD-10-CM | POA: Diagnosis not present

## 2021-03-13 DIAGNOSIS — J3089 Other allergic rhinitis: Secondary | ICD-10-CM | POA: Diagnosis not present

## 2021-03-13 MED ORDER — MONTELUKAST SODIUM 10 MG PO TABS: 10.0000 mg | ORAL_TABLET | Freq: Every day | ORAL | 2 refills | Status: DC

## 2021-03-13 MED ORDER — AZELASTINE HCL 0.1 % NA SOLN: 1.0000 | Freq: Two times a day (BID) | NASAL | 2 refills | Status: DC

## 2021-03-13 MED ORDER — AMOXICILLIN 875 MG PO TABS: 875.0000 mg | ORAL_TABLET | Freq: Two times a day (BID) | ORAL | 0 refills | Status: DC

## 2021-03-13 NOTE — ED Triage Notes (Signed)
Patient states that both of her ears have been hurting for 2 weeks  Patient states that the pain is going into her jaw from her ear.  Patient states she is experiencing dizziness after eating and laying down.  Patient states she has taken Ibuprofen   She states she has some mucus that wont come up  Denies Fever

## 2021-03-13 NOTE — ED Provider Notes (Signed)
[facility]    CSN: [id number] Arrival date & time: 03/13/21  1604      History   Chief Complaint Chief Complaint  Patient presents with   Otalgia    Jaw pain, earache and dizziness     HPI Anna Andrews is a 26 y.o. female.   Presenting today with 2-week history of progressively worsening left ear pain, soreness into jaw on the left side and now the past day or so having a lightheaded feeling upon position change and standing up that last only moments.  Denies head injury, syncope, visual changes, nausea, vomiting, mental status changes, fever, chills.  Tried some ibuprofen with only mild temporary relief of the ear pain.  Known history of seasonal allergies on Zyrtec though has ongoing waxing and waning sinus symptoms regardless.  Of note, was seen in the emergency department 2 days ago for a possible allergic reaction to a food with some lip swelling, given high-dose of steroids and Benadryl while in the ED with resolution of the symptoms.   Past Medical History:  Diagnosis Date   Acid reflux    Anxiety    Eczema    Gestational hypertension 04/27/2019   Supervision of normal first pregnancy 10/19/2018     FAMILY TREE  LAB RESULTS  Language English Pap 08/20/16 neg  Initiated care at [facility] GC/CT Initial: neg/neg        36wks: neg/neg  Dating by LMP c/w 9wk U/S    Support person Nathaneil [facility] [employer] NT/IT/AFP/MaterniT21:declined    Berthold/HgbE neg  Flu vaccine 12/22/18  CF declined  TDaP vaccine 02/16/19  SMA declined  Rhogam N/A Fragile X declined       Anatomy US Normal female Blood Type A/Positive/-- (08/11     Patient Active Problem List   Diagnosis Date Noted   Influenza 02/14/2021   Annual physical exam 01/25/2021   Pain and swelling of right lower leg 01/25/2021   Obesity (BMI 30-39.9) 01/25/2021   Anxiety 10/19/2018    Past Surgical History:  Procedure Laterality Date   NO PAST SURGERIES      OB History     Gravida  1   Para  1   Term  1   Preterm  0    AB  0   Living  1      SAB  0   IAB  0   Ectopic  0   Multiple  0   Live Births  1            Home Medications    Prior to Admission medications   Medication Sig Start Date End Date Taking? Authorizing Provider  amoxicillin (AMOXIL) 875 MG tablet Take 1 tablet (875 mg total) by mouth 2 (two) times daily. 03/13/21  Yes Volney American, PA-C  azelastine (ASTELIN) 0.1 % nasal spray Place 1 spray into both nostrils 2 (two) times daily. Use in each nostril as directed 03/13/21  Yes Volney American, PA-C  montelukast (SINGULAIR) 10 MG tablet Take 1 tablet (10 mg total) by mouth at bedtime. 03/13/21  Yes Volney American, PA-C  albuterol (VENTOLIN HFA) 108 (90 Base) MCG/ACT inhaler Inhale 1-2 puffs into the lungs every 6 (six) hours as needed for wheezing or shortness of breath. 02/22/21   Volney American, PA-C  EPINEPHrine 0.3 mg/0.3 mL IJ SOAJ injection Inject 0.3 mg into the muscle as needed for anaphylaxis. 03/11/21   Mesner, Corene Cornea, MD  hydrochlorothiazide (HYDRODIURIL) 12.5 MG tablet Take  1 tablet (12.5 mg total) by mouth in the morning. Patient not taking: Reported on 01/29/2021 01/25/21   Jeanie Sewer, NP  hydrocortisone cream 1 % Apply 1 application topically 2 (two) times daily as needed for itching (For eczema.).    [provider]  lidocaine (XYLOCAINE) 2 % solution Use as directed 10 mLs in the mouth or throat as needed for mouth pain. 02/22/21   Volney American, PA-C  predniSONE (DELTASONE) 20 MG tablet Take 2 tablets (40 mg total) by mouth daily with breakfast. 02/22/21   Volney American, PA-C  sertraline (ZOLOFT) 50 MG tablet Take 1 tablet (50 mg total) by mouth daily. 05/03/20   Cresenzo-Dishmon, Joaquim Lai, CNM  enalapril (VASOTEC) 5 MG tablet Take 1 tablet (5 mg total) by mouth daily. Patient not taking: Reported on 06/02/2019 05/05/19 11/02/19  Christin Fudge, CNM    Family History Family History  Problem  Relation Age of Onset   Hypertension Paternal Grandfather    CAD Paternal Grandmother    Hypertension Mother     Social History Social History   Tobacco Use   Smoking status: Former    Packs/day: 0.50    Types: Cigarettes   Smokeless tobacco: Never   Tobacco comments:    occ  Vaping Use   Vaping Use: Never used  Substance Use Topics   Alcohol use: No   Drug use: No     Allergies   Patient has no known allergies.   Review of Systems Review of Systems Per HPI  Physical Exam Triage Vital Signs ED Triage Vitals [03/13/21 1656]  Enc Vitals Group     BP 131/88     Pulse Rate 85     Resp 18     Temp 97.9 F (36.6 C)     Temp Source Oral     SpO2 96 %     Weight      Height      Head Circumference      Peak Flow      Pain Score      Pain Loc      Pain Edu?      Excl. in [place]?    No data found.  Updated Vital Signs BP 131/88 (BP Location: Right Arm)    Pulse 85    Temp 97.9 F (36.6 C) (Oral)    Resp 18    LMP 02/12/2021    SpO2 96%    Breastfeeding No   Visual Acuity Right Eye Distance:   Left Eye Distance:   Bilateral Distance:    Right Eye Near:   Left Eye Near:    Bilateral Near:     Physical Exam Vitals and nursing note reviewed.  Constitutional:      Appearance: Normal appearance. She is not ill-appearing.  HENT:     Head: Atraumatic.     Ears:     Comments: Right middle ear effusion, left TM bulging, erythematous    Nose:     Comments: Nasal mucosa erythematous and edematous, boggy nasal turbinates bilaterally    Mouth/Throat:     Mouth: Mucous membranes are moist.     Pharynx: Oropharynx is clear.  Eyes:     Extraocular Movements: Extraocular movements intact.     Conjunctiva/sclera: Conjunctivae normal.  Cardiovascular:     Rate and Rhythm: Normal rate and regular rhythm.     Heart sounds: Normal heart sounds.  Pulmonary:     Effort: Pulmonary effort is normal.  Breath sounds: Normal breath sounds.  Musculoskeletal:         General: Normal range of motion.     Cervical back: Normal range of motion and neck supple.  Lymphadenopathy:     Head:     Left side of head: Preauricular adenopathy present.  Skin:    General: Skin is warm and dry.  Neurological:     Mental Status: She is alert and oriented to person, place, and time.     Cranial Nerves: No cranial nerve deficit.     Motor: No weakness.     Gait: Gait normal.  Psychiatric:        Mood and Affect: Mood normal.        Thought Content: Thought content normal.        Judgment: Judgment normal.     UC Treatments / Results  Labs (all labs ordered are listed, but only abnormal results are displayed) Labs Reviewed - No data to display  EKG   Radiology No results found.  Procedures Procedures (including critical care time)  Medications Ordered in UC Medications - No data to display  Initial Impression / Assessment and Plan / UC Course  I have reviewed the triage vital signs and the nursing notes.  Pertinent labs & imaging results that were available during my care of the patient were reviewed by me and considered in my medical decision making (see chart for details).     Suspect uncontrolled seasonal allergies leading into secondary left ear infection which is likely causing her feelings of lightheadedness, dizziness.  We will treat with antibiotics, nasal spray, antihistamine and add Singulair given her ongoing sinus issues not controlled on antihistamines alone.  ENT resources given as she has ongoing chronic sinus issues likely related to her seasonal allergies.  Final Clinical Impressions(s) / UC Diagnoses   Final diagnoses:  Acute suppurative otitis media of left ear without spontaneous rupture of tympanic membrane, recurrence not specified  Seasonal allergic rhinitis due to other allergic trigger   Discharge Instructions   None    ED Prescriptions     Medication Sig Dispense Auth. Provider   montelukast (SINGULAIR) 10 MG  tablet Take 1 tablet (10 mg total) by mouth at bedtime. 30 tablet Volney American, [place]   azelastine (ASTELIN) 0.1 % nasal spray Place 1 spray into both nostrils 2 (two) times daily. Use in each nostril as directed 30 mL Volney American, PA-C   amoxicillin (AMOXIL) 875 MG tablet Take 1 tablet (875 mg total) by mouth 2 (two) times daily. 20 tablet Volney American, [place]      PDMP not reviewed this encounter.   Volney American, [place] 03/13/21 1859

## 2021-03-15 ENCOUNTER — Encounter (HOSPITAL_COMMUNITY): Payer: Self-pay

## 2021-03-15 ENCOUNTER — Emergency Department (HOSPITAL_COMMUNITY)
Admission: EM | Admit: 2021-03-15 | Discharge: 2021-03-16 | Disposition: A | Discharge status: Home / Self Care | Payer: 59 | Attending: Emergency Medicine | Admitting: Emergency Medicine

## 2021-03-15 ENCOUNTER — Other Ambulatory Visit: Payer: Self-pay

## 2021-03-15 DIAGNOSIS — I10 Essential (primary) hypertension: Secondary | ICD-10-CM | POA: Insufficient documentation

## 2021-03-15 DIAGNOSIS — R42 Dizziness and giddiness: Secondary | ICD-10-CM | POA: Diagnosis not present

## 2021-03-15 DIAGNOSIS — R0602 Shortness of breath: Secondary | ICD-10-CM | POA: Diagnosis not present

## 2021-03-15 DIAGNOSIS — Z79899 Other long term (current) drug therapy: Secondary | ICD-10-CM | POA: Insufficient documentation

## 2021-03-15 DIAGNOSIS — J069 Acute upper respiratory infection, unspecified: Secondary | ICD-10-CM | POA: Diagnosis not present

## 2021-03-15 DIAGNOSIS — M542 Cervicalgia: Secondary | ICD-10-CM | POA: Diagnosis not present

## 2021-03-15 DIAGNOSIS — H81399 Other peripheral vertigo, unspecified ear: Secondary | ICD-10-CM

## 2021-03-15 NOTE — ED Triage Notes (Signed)
Presenting today with 2-week history of progressively worsening left ear pain, soreness into jaw on the left side and now the past day or so having a lightheaded feeling and dizziness with moving head, feels like room is moving. Pt denies ear pain, is currently taking antibiotics that was prescribed 2 days ago for left ear infection.

## 2021-03-16 ENCOUNTER — Other Ambulatory Visit: Payer: Self-pay | Admitting: Family Medicine

## 2021-03-16 ENCOUNTER — Other Ambulatory Visit: Payer: Self-pay

## 2021-03-16 ENCOUNTER — Emergency Department (HOSPITAL_COMMUNITY)
Admission: EM | Admit: 2021-03-16 | Discharge: 2021-03-17 | Disposition: A | Discharge status: Home / Self Care | Payer: 59 | Attending: Emergency Medicine | Admitting: Emergency Medicine

## 2021-03-16 ENCOUNTER — Encounter (HOSPITAL_COMMUNITY): Payer: Self-pay

## 2021-03-16 DIAGNOSIS — R0602 Shortness of breath: Secondary | ICD-10-CM | POA: Insufficient documentation

## 2021-03-16 DIAGNOSIS — J069 Acute upper respiratory infection, unspecified: Secondary | ICD-10-CM | POA: Diagnosis not present

## 2021-03-16 DIAGNOSIS — R42 Dizziness and giddiness: Secondary | ICD-10-CM | POA: Diagnosis not present

## 2021-03-16 DIAGNOSIS — M542 Cervicalgia: Secondary | ICD-10-CM | POA: Insufficient documentation

## 2021-03-16 DIAGNOSIS — R059 Cough, unspecified: Secondary | ICD-10-CM | POA: Diagnosis present

## 2021-03-16 MED ORDER — MECLIZINE HCL 12.5 MG PO TABS
25.0000 mg | ORAL_TABLET | Freq: Once | ORAL | Status: AC
  Administered 2021-03-16: 25 mg via ORAL
  Filled 2021-03-16: qty 2

## 2021-03-16 MED ORDER — MECLIZINE HCL 25 MG PO TABS: 25.0000 mg | ORAL_TABLET | Freq: Three times a day (TID) | ORAL | 0 refills | Status: DC | PRN

## 2021-03-16 NOTE — ED Provider Notes (Signed)
[facility] EMERGENCY DEPARTMENT Provider Note   CSN: [id number] Arrival date & time: 03/15/21  2246     History  Chief Complaint  Patient presents with   Dizziness    Anna Andrews is a 26 y.o. female.  The history is provided by the patient.  Dizziness She has history of anxiety, gestational hypertension and comes in complaining of dizziness.  She states that since this afternoon, she has noted intermittent dizzy feeling like things were moving.  Symptoms would tend to be aggravated by leaning over and then sits standing up, also by moving her head when she was driving.  She denies ear pain, hearing loss, tinnitus.  She was recently treated for an ear infection, ear pain has resolved.  She denies fever or chills.  She has not had any nausea.   Home Medications Prior to Admission medications   Medication Sig Start Date End Date Taking? Authorizing Provider  meclizine (ANTIVERT) 25 MG tablet Take 1 tablet (25 mg total) by mouth 3 (three) times daily as needed for dizziness. 03/16/21  Yes Dione Booze, MD  albuterol (VENTOLIN HFA) 108 (90 Base) MCG/ACT inhaler Inhale 1-2 puffs into the lungs every 6 (six) hours as needed for wheezing or shortness of breath. 02/22/21   Particia Nearing, PA-C  amoxicillin (AMOXIL) 875 MG tablet Take 1 tablet (875 mg total) by mouth 2 (two) times daily. 03/13/21   Particia Nearing, PA-C  azelastine (ASTELIN) 0.1 % nasal spray Place 1 spray into both nostrils 2 (two) times daily. Use in each nostril as directed 03/13/21   Particia Nearing, PA-C  EPINEPHrine 0.3 mg/0.3 mL IJ SOAJ injection Inject 0.3 mg into the muscle as needed for anaphylaxis. 03/11/21   Mesner, Barbara Cower, MD  hydrochlorothiazide (HYDRODIURIL) 12.5 MG tablet Take 1 tablet (12.5 mg total) by mouth in the morning. Patient not taking: Reported on 01/29/2021 01/25/21   Dulce Sellar, NP  hydrocortisone cream 1 % Apply 1 application topically 2 (two) times daily as needed for itching  (For eczema.).    [provider]  lidocaine (XYLOCAINE) 2 % solution Use as directed 10 mLs in the mouth or throat as needed for mouth pain. 02/22/21   Particia Nearing, PA-C  montelukast (SINGULAIR) 10 MG tablet Take 1 tablet (10 mg total) by mouth at bedtime. 03/13/21   Particia Nearing, PA-C  sertraline (ZOLOFT) 50 MG tablet Take 1 tablet (50 mg total) by mouth daily. 05/03/20   Cresenzo-Dishmon, Scarlette Calico, CNM  enalapril (VASOTEC) 5 MG tablet Take 1 tablet (5 mg total) by mouth daily. Patient not taking: Reported on 06/02/2019 05/05/19 11/02/19  Jacklyn Shell, CNM      Allergies    Patient has no known allergies.    Review of Systems   Review of Systems  Neurological:  Positive for dizziness.  All other systems reviewed and are negative.  Physical Exam Updated Vital Signs BP 126/89    Pulse 85    Temp 98.1 F (36.7 C) (Oral)    Resp 20    Ht 5\' 7"  (1.702 m)    Wt 104.3 kg    LMP 03/15/2021 (Exact Date)    SpO2 100%    BMI 36.02 kg/m  Physical Exam Vitals and nursing note reviewed.  26 year old female, resting comfortably and in no acute distress. Vital signs are normal. Oxygen saturation is 100%, which is normal. Head is normocephalic and atraumatic. PERRLA, EOMI. Oropharynx is clear.  Tympanic membranes are clear. Neck  is nontender and supple without adenopathy or JVD. Back is nontender and there is no CVA tenderness. Lungs are clear without rales, wheezes, or rhonchi. Chest is nontender. Heart has regular rate and rhythm without murmur. Abdomen is soft, flat, nontender. Extremities have no cyanosis or edema, full range of motion is present. Skin is warm and dry without rash. Neurologic: Mental status is normal, cranial nerves are intact, strength is 5/5 in all 4 extremities.  Finger-nose testing is normal.  There is no nystagmus.  Dizziness is reproduced by passive head movement.  ED Results / Procedures / Treatments    Procedures Procedures     Medications Ordered in ED Medications  meclizine (ANTIVERT) tablet 25 mg (has no administration in time range)   ED Course/ Medical Decision Making/ A&P                           Medical Decision Making  Dizziness which is clearly peripheral vertigo.  No red flags to suggest central vertigo.  Patient expresses concern that she may have a brain abscess because of her recent ear infection, advised that there are no signs or symptoms to suggest that.  She is asking if blood work is needed, advised that none is necessary as her diagnosis is clear based on her history and physical.  She is discharged with prescription for meclizine.  Old records are reviewed confirming recent urgent care visit for otitis media and prescription for amoxicillin.  Of note, ear exam is normal, she has had a good clinical response to amoxicillin.        Final Clinical Impression(s) / ED Diagnoses Final diagnoses:  Peripheral vertigo, unspecified laterality    Rx / DC Orders ED Discharge Orders          Ordered    meclizine (ANTIVERT) 25 MG tablet  3 times daily PRN        03/16/21 0156              Dione Booze, MD 03/16/21 0207

## 2021-03-16 NOTE — ED Triage Notes (Signed)
Pt reports continued dizziness, pt seen here last night for the this and Rx meclizine, pt was also here two days ago for ear infection. Pt says today she has had sob, dizziness, and feeling "like she was going to pass out". Pt says she was working and felt this way and had to sit down, b/c she was feeling faint.

## 2021-03-17 ENCOUNTER — Emergency Department (HOSPITAL_COMMUNITY): Payer: 59

## 2021-03-17 DIAGNOSIS — J069 Acute upper respiratory infection, unspecified: Secondary | ICD-10-CM | POA: Diagnosis not present

## 2021-03-17 DIAGNOSIS — R42 Dizziness and giddiness: Secondary | ICD-10-CM | POA: Diagnosis not present

## 2021-03-17 LAB — CBC WITH DIFFERENTIAL/PLATELET
Abs Immature Granulocytes: 0.03 10*3/uL (ref 0.00–0.07)
Basophils Absolute: 0 10*3/uL (ref 0.0–0.1)
Basophils Relative: 1 %
Eosinophils Absolute: 0.1 10*3/uL (ref 0.0–0.5)
Eosinophils Relative: 1 %
HCT: 35.5 % — ABNORMAL LOW (ref 36.0–46.0)
Hemoglobin: 11.5 g/dL — ABNORMAL LOW (ref 12.0–15.0)
Immature Granulocytes: 0 %
Lymphocytes Relative: 24 %
Lymphs Abs: 2 10*3/uL (ref 0.7–4.0)
MCH: 26.3 pg (ref 26.0–34.0)
MCHC: 32.4 g/dL (ref 30.0–36.0)
MCV: 81.2 fL (ref 80.0–100.0)
Monocytes Absolute: 0.5 10*3/uL (ref 0.1–1.0)
Monocytes Relative: 6 %
Neutro Abs: 5.8 10*3/uL (ref 1.7–7.7)
Neutrophils Relative %: 68 %
Platelets: 311 10*3/uL (ref 150–400)
RBC: 4.37 MIL/uL (ref 3.87–5.11)
RDW: 14.2 % (ref 11.5–15.5)
WBC: 8.5 10*3/uL (ref 4.0–10.5)
nRBC: 0 % (ref 0.0–0.2)

## 2021-03-17 LAB — BASIC METABOLIC PANEL
Anion gap: 9 (ref 5–15)
BUN: 10 mg/dL (ref 6–20)
CO2: 24 mmol/L (ref 22–32)
Calcium: 8.8 mg/dL — ABNORMAL LOW (ref 8.9–10.3)
Chloride: 104 mmol/L (ref 98–111)
Creatinine, Ser: 0.72 mg/dL (ref 0.44–1.00)
GFR, Estimated: >60 mL/min (ref >60)
Glucose, Bld: 107 mg/dL — ABNORMAL HIGH (ref 70–99)
Potassium: 3.6 mmol/L (ref 3.5–5.1)
Sodium: 137 mmol/L (ref 135–145)

## 2021-03-17 LAB — POC URINE PREG, ED: Preg Test, Ur: NEGATIVE

## 2021-03-17 MED ORDER — PREDNISONE 20 MG PO TABS: 40.0000 mg | ORAL_TABLET | Freq: Every day | ORAL | 0 refills | Status: DC

## 2021-03-17 NOTE — ED Provider Notes (Signed)
[facility] EMERGENCY DEPARTMENT Provider Note   CSN: [id number] Arrival date & time: 03/16/21  2047     History  Chief Complaint  Patient presents with   Shortness of Breath    Anna Andrews is a 26 y.o. female.  Patient presents to the emergency department for evaluation of persistent dizziness, left-sided facial and neck pain, cough, shortness of breath.  Patient reports that she was at work tonight and she felt like she was going to pass out.  No loss of consciousness.  Patient was seen in the ED 2 days ago and diagnosed with vertigo.  She has had multiple COVID and flu test that are negative.      Home Medications Prior to Admission medications   Medication Sig Start Date End Date Taking? Authorizing Provider  albuterol (VENTOLIN HFA) 108 (90 Base) MCG/ACT inhaler Inhale 1-2 puffs into the lungs every 6 (six) hours as needed for wheezing or shortness of breath. 02/22/21   Particia Nearing, PA-C  amoxicillin (AMOXIL) 875 MG tablet Take 1 tablet (875 mg total) by mouth 2 (two) times daily. 03/13/21   Particia Nearing, PA-C  azelastine (ASTELIN) 0.1 % nasal spray Place 1 spray into both nostrils 2 (two) times daily. Use in each nostril as directed 03/13/21   Particia Nearing, PA-C  EPINEPHrine 0.3 mg/0.3 mL IJ SOAJ injection Inject 0.3 mg into the muscle as needed for anaphylaxis. 03/11/21   Mesner, Barbara Cower, MD  hydrochlorothiazide (HYDRODIURIL) 12.5 MG tablet Take 1 tablet (12.5 mg total) by mouth in the morning. Patient not taking: Reported on 01/29/2021 01/25/21   Dulce Sellar, NP  hydrocortisone cream 1 % Apply 1 application topically 2 (two) times daily as needed for itching (For eczema.).    [provider]  lidocaine (XYLOCAINE) 2 % solution Use as directed 10 mLs in the mouth or throat as needed for mouth pain. 02/22/21   Particia Nearing, PA-C  meclizine (ANTIVERT) 25 MG tablet Take 1 tablet (25 mg total) by mouth 3 (three) times daily as  needed for dizziness. 03/16/21   Dione Booze, MD  montelukast (SINGULAIR) 10 MG tablet Take 1 tablet (10 mg total) by mouth at bedtime. 03/13/21   Particia Nearing, PA-C  sertraline (ZOLOFT) 50 MG tablet Take 1 tablet (50 mg total) by mouth daily. 05/03/20   Cresenzo-Dishmon, Scarlette Calico, CNM  enalapril (VASOTEC) 5 MG tablet Take 1 tablet (5 mg total) by mouth daily. Patient not taking: Reported on 06/02/2019 05/05/19 11/02/19  Jacklyn Shell, CNM      Allergies    Patient has no known allergies.    Review of Systems   Review of Systems  Physical Exam Updated Vital Signs BP 126/82    Pulse 89    Temp 98.2 F (36.8 C) (Oral)    Resp 16    Ht 5\' 7"  (1.702 m)    Wt 104.3 kg    LMP 03/15/2021 (Exact Date)    SpO2 99%    BMI 36.02 kg/m  Physical Exam Vitals and nursing note reviewed.  Constitutional:      General: She is not in acute distress.    Appearance: Normal appearance. She is well-developed.  HENT:     Head: Normocephalic and atraumatic.     Right Ear: Hearing, tympanic membrane and ear canal normal.     Left Ear: Hearing, tympanic membrane and ear canal normal.     Nose: Nose normal.  Eyes:     Conjunctiva/sclera: Conjunctivae normal.  Pupils: Pupils are equal, round, and reactive to light.  Cardiovascular:     Rate and Rhythm: Regular rhythm.     Heart sounds: S1 normal and S2 normal. No murmur heard.   No friction rub. No gallop.  Pulmonary:     Effort: Pulmonary effort is normal. No respiratory distress.     Breath sounds: Normal breath sounds.  Chest:     Chest wall: No tenderness.  Abdominal:     General: Bowel sounds are normal.     Palpations: Abdomen is soft.     Tenderness: There is no abdominal tenderness. There is no guarding or rebound. Negative signs include Murphy's sign and McBurney's sign.     Hernia: No hernia is present.  Musculoskeletal:        General: Normal range of motion.     Cervical back: Normal range of motion and neck supple.   Skin:    General: Skin is warm and dry.     Findings: No rash.  Neurological:     Mental Status: She is alert and oriented to person, place, and time.     GCS: GCS eye subscore is 4. GCS verbal subscore is 5. GCS motor subscore is 6.     Cranial Nerves: No cranial nerve deficit.     Sensory: No sensory deficit.     Coordination: Coordination normal.  Psychiatric:        Speech: Speech normal.        Behavior: Behavior normal.        Thought Content: Thought content normal.    ED Results / Procedures / Treatments   Labs (all labs ordered are listed, but only abnormal results are displayed) Labs Reviewed  CBC WITH DIFFERENTIAL/PLATELET - Abnormal; Notable for the following components:      Result Value   Hemoglobin 11.5 (*)    HCT 35.5 (*)    All other components within normal limits  BASIC METABOLIC PANEL  POC URINE PREG, ED    EKG None  Radiology CT HEAD WO CONTRAST (5MM)  Result Date: 03/17/2021 CLINICAL DATA:  Dizziness. EXAM: CT HEAD WITHOUT CONTRAST TECHNIQUE: Contiguous axial images were obtained from the base of the skull through the vertex without intravenous contrast. COMPARISON:  None. FINDINGS: Brain: The ventricles and sulci are appropriate size for the patient's age. The gray-white matter discrimination is preserved. There is no acute intracranial hemorrhage. No mass effect or midline shift. No extra-axial fluid collection. Vascular: No hyperdense vessel or unexpected calcification. Skull: Normal. Negative for fracture or focal lesion. Sinuses/Orbits: No acute finding. Other: None IMPRESSION: Unremarkable noncontrast CT of the brain. Electronically Signed   By: Elgie CollardArash  Radparvar M.D.   On: 03/17/2021 02:13   DG Chest Port 1 View  Result Date: 03/17/2021 CLINICAL DATA:  Shortness of breath EXAM: PORTABLE CHEST 1 VIEW COMPARISON:  02/22/2021 FINDINGS: Heart and mediastinal contours are within normal limits. No focal opacities or effusions. No acute bony abnormality.  IMPRESSION: No active disease. Electronically Signed   By: Charlett NoseKevin  Dover M.D.   On: 03/17/2021 02:02    Procedures Procedures    Medications Ordered in ED Medications - No data to display  ED Course/ Medical Decision Making/ A&P                           Medical Decision Making  26 year old female with no significant past medical history other than acid reflux and eczema presents to the emergency department  with persistent URI symptoms.  Patient reports that she felt like she was going to pass out tonight.  She does have positional dizziness that was previously diagnosed as vertigo, likely inflammatory secondary to the URI.  She has been tested for COVID and flu already.  Patient feels very concerned about her symptoms and think she needs imaging and lab work.  Labs are unremarkable.  Chest x-ray, independently interpreted by myself, negative for pneumonia.  CT head also unremarkable.  Patient reassured, continue treatment for URI.        Final Clinical Impression(s) / ED Diagnoses Final diagnoses:  Upper respiratory tract infection, unspecified type  Vertigo    Rx / DC Orders ED Discharge Orders     None         Nahia Nissan, Canary Brim, MD 03/17/21 [phone]

## 2021-03-18 ENCOUNTER — Encounter: Payer: Self-pay | Admitting: Family

## 2021-04-07 ENCOUNTER — Other Ambulatory Visit: Payer: Self-pay

## 2021-04-07 ENCOUNTER — Encounter (HOSPITAL_COMMUNITY): Payer: Self-pay

## 2021-04-07 ENCOUNTER — Emergency Department (HOSPITAL_COMMUNITY)
Admission: EM | Admit: 2021-04-07 | Discharge: 2021-04-08 | Disposition: A | Discharge status: Home / Self Care | Payer: 59 | Attending: Emergency Medicine | Admitting: Emergency Medicine

## 2021-04-07 ENCOUNTER — Emergency Department (HOSPITAL_COMMUNITY): Payer: 59

## 2021-04-07 DIAGNOSIS — N83291 Other ovarian cyst, right side: Secondary | ICD-10-CM | POA: Diagnosis not present

## 2021-04-07 DIAGNOSIS — R509 Fever, unspecified: Secondary | ICD-10-CM | POA: Insufficient documentation

## 2021-04-07 DIAGNOSIS — R197 Diarrhea, unspecified: Secondary | ICD-10-CM | POA: Diagnosis not present

## 2021-04-07 DIAGNOSIS — Z79899 Other long term (current) drug therapy: Secondary | ICD-10-CM | POA: Diagnosis not present

## 2021-04-07 DIAGNOSIS — R1031 Right lower quadrant pain: Secondary | ICD-10-CM | POA: Diagnosis not present

## 2021-04-07 DIAGNOSIS — I10 Essential (primary) hypertension: Secondary | ICD-10-CM | POA: Diagnosis not present

## 2021-04-07 DIAGNOSIS — R0602 Shortness of breath: Secondary | ICD-10-CM | POA: Insufficient documentation

## 2021-04-07 DIAGNOSIS — Z20822 Contact with and (suspected) exposure to covid-19: Secondary | ICD-10-CM | POA: Insufficient documentation

## 2021-04-07 DIAGNOSIS — R220 Localized swelling, mass and lump, head: Secondary | ICD-10-CM | POA: Diagnosis not present

## 2021-04-07 DIAGNOSIS — D72829 Elevated white blood cell count, unspecified: Secondary | ICD-10-CM | POA: Insufficient documentation

## 2021-04-07 DIAGNOSIS — N83201 Unspecified ovarian cyst, right side: Secondary | ICD-10-CM

## 2021-04-07 LAB — CBC WITH DIFFERENTIAL/PLATELET
Abs Immature Granulocytes: 0.05 10*3/uL (ref 0.00–0.07)
Basophils Absolute: 0 10*3/uL (ref 0.0–0.1)
Basophils Relative: 0 %
Eosinophils Absolute: 0 10*3/uL (ref 0.0–0.5)
Eosinophils Relative: 0 %
HCT: 41.5 % (ref 36.0–46.0)
Hemoglobin: 13.4 g/dL (ref 12.0–15.0)
Immature Granulocytes: 0 %
Lymphocytes Relative: 9 %
Lymphs Abs: 1 10*3/uL (ref 0.7–4.0)
MCH: 26.4 pg (ref 26.0–34.0)
MCHC: 32.3 g/dL (ref 30.0–36.0)
MCV: 81.7 fL (ref 80.0–100.0)
Monocytes Absolute: 0.5 10*3/uL (ref 0.1–1.0)
Monocytes Relative: 4 %
Neutro Abs: 10 10*3/uL — ABNORMAL HIGH (ref 1.7–7.7)
Neutrophils Relative %: 87 %
Platelets: 345 10*3/uL (ref 150–400)
RBC: 5.08 MIL/uL (ref 3.87–5.11)
RDW: 14.2 % (ref 11.5–15.5)
WBC: 11.6 10*3/uL — ABNORMAL HIGH (ref 4.0–10.5)
nRBC: 0 % (ref 0.0–0.2)

## 2021-04-07 MED ORDER — SODIUM CHLORIDE 0.9 % IV BOLUS
1000.0000 mL | Freq: Once | INTRAVENOUS | Status: AC
  Administered 2021-04-07: 1000 mL via INTRAVENOUS

## 2021-04-07 NOTE — ED Notes (Signed)
X-ray at bedside

## 2021-04-07 NOTE — ED Provider Notes (Signed)
[facility] EMERGENCY DEPARTMENT Provider Note   CSN: [id number] Arrival date & time: 04/07/21  2231     History  Chief Complaint  Patient presents with   Abdominal Pain        Diarrhea    Anna Andrews is a 26 y.o. female.  Patient is a 26 year old female with history of hypertension, obesity.  Patient presenting today for evaluation of abdominal pain.  She describes a several day history of generalized abdominal cramping, but now complains of pain to the right lower quadrant.  She reports fever at home of 101.  She also describes several episodes of loose stool, but nothing that appears bloody.  She denies vomiting.  Patient also complains of feeling short of breath when she ambulates.  She denies any chest pain, cough.  She also complains of a swollen area to the left side of her lower lip.  She reports having a filling 1 week ago, then developed a sore inside her lip.  This improved, then has returned.  She denies any drainage or pain.  The history is provided by the patient.  Abdominal Pain Pain location:  RLQ Pain quality: cramping   Pain radiates to:  Does not radiate Pain severity:  Moderate Onset quality:  Gradual Duration:  3 days Timing:  Constant Progression:  Worsening Chronicity:  New Associated symptoms: diarrhea   Diarrhea Associated symptoms: abdominal pain       Home Medications Prior to Admission medications   Medication Sig Start Date End Date Taking? Authorizing Provider  albuterol (VENTOLIN HFA) 108 (90 Base) MCG/ACT inhaler Inhale 1-2 puffs into the lungs every 6 (six) hours as needed for wheezing or shortness of breath. 02/22/21   Particia Nearing, PA-C  amoxicillin (AMOXIL) 875 MG tablet Take 1 tablet (875 mg total) by mouth 2 (two) times daily. 03/13/21   Particia Nearing, PA-C  azelastine (ASTELIN) 0.1 % nasal spray Place 1 spray into both nostrils 2 (two) times daily. Use in each nostril as directed 03/13/21   Particia Nearing,  PA-C  EPINEPHrine 0.3 mg/0.3 mL IJ SOAJ injection Inject 0.3 mg into the muscle as needed for anaphylaxis. 03/11/21   Mesner, Barbara Cower, MD  hydrochlorothiazide (HYDRODIURIL) 12.5 MG tablet Take 1 tablet (12.5 mg total) by mouth in the morning. Patient not taking: Reported on 01/29/2021 01/25/21   Dulce Sellar, NP  hydrocortisone cream 1 % Apply 1 application topically 2 (two) times daily as needed for itching (For eczema.).    [provider]  lidocaine (XYLOCAINE) 2 % solution Use as directed 10 mLs in the mouth or throat as needed for mouth pain. 02/22/21   Particia Nearing, PA-C  meclizine (ANTIVERT) 25 MG tablet Take 1 tablet (25 mg total) by mouth 3 (three) times daily as needed for dizziness. 03/16/21   Dione Booze, MD  montelukast (SINGULAIR) 10 MG tablet Take 1 tablet (10 mg total) by mouth at bedtime. 03/13/21   Particia Nearing, PA-C  predniSONE (DELTASONE) 20 MG tablet Take 2 tablets (40 mg total) by mouth daily with breakfast. 03/17/21   Pollina, Canary Brim, MD  sertraline (ZOLOFT) 50 MG tablet Take 1 tablet (50 mg total) by mouth daily. 05/03/20   Cresenzo-Dishmon, Scarlette Calico, CNM  enalapril (VASOTEC) 5 MG tablet Take 1 tablet (5 mg total) by mouth daily. Patient not taking: Reported on 06/02/2019 05/05/19 11/02/19  Jacklyn Shell, CNM      Allergies    Patient has no known allergies.  Review of Systems   Review of Systems  Gastrointestinal:  Positive for abdominal pain and diarrhea.  All other systems reviewed and are negative.  Physical Exam Updated Vital Signs BP (!) 148/102 (BP Location: Right Arm)    Pulse (!) 112    Temp 98.5 F (36.9 C) (Oral)    Resp 15    Ht 5\' 8"  (1.727 m)    Wt 108.9 kg    LMP 03/15/2021 (Exact Date)    SpO2 100%    BMI 36.49 kg/m  Physical Exam Vitals and nursing note reviewed.  Constitutional:      General: She is not in acute distress.    Appearance: She is well-developed. She is not diaphoretic.  HENT:     Head:  Normocephalic and atraumatic.     Mouth/Throat:     Comments: To the inside of the left lower lip, there is a small nodule, but no obvious abscess or purulent drainage. Cardiovascular:     Rate and Rhythm: Normal rate and regular rhythm.     Heart sounds: No murmur heard.   No friction rub. No gallop.  Pulmonary:     Effort: Pulmonary effort is normal. No respiratory distress.     Breath sounds: Normal breath sounds. No wheezing.  Abdominal:     General: Bowel sounds are normal. There is no distension.     Palpations: Abdomen is soft.     Tenderness: There is abdominal tenderness in the right lower quadrant and epigastric area. There is no right CVA tenderness, left CVA tenderness, guarding or rebound.  Musculoskeletal:        General: Normal range of motion.     Cervical back: Normal range of motion and neck supple.  Skin:    General: Skin is warm and dry.  Neurological:     General: No focal deficit present.     Mental Status: She is alert and oriented to person, place, and time.    ED Results / Procedures / Treatments   Labs (all labs ordered are listed, but only abnormal results are displayed) Labs Reviewed - No data to display  EKG None  Radiology No results found.  Procedures Procedures    Medications Ordered in ED Medications  sodium chloride 0.9 % bolus 1,000 mL (has no administration in time range)    ED Course/ Medical Decision Making/ A&P  This patient presents to the ED for concern of right lower quadrant abdominal pain, this involves an extensive number of treatment options, and is a complaint that carries with it a high risk of complications and morbidity.  The differential diagnosis includes ectopic pregnancy, acute appendicitis, ovarian cyst, ovarian torsion   Co morbidities that complicate the patient evaluation  None   Additional history obtained:  No additional history or outside records necessary   Lab Tests:  I Ordered, and personally  interpreted labs.  The pertinent results include: CBC showing mild leukocytosis, but basic metabolic panel, urinalysis, and pregnancy test are all negative   Imaging Studies ordered:  I ordered imaging studies including CT scan of the abdomen and pelvis I independently visualized and interpreted imaging which showed a right-sided ovarian cyst, but no evidence for appendicitis or other acute pathology I agree with the radiologist interpretation   Cardiac Monitoring:  No cardiac monitoring   Medicines ordered and prescription drug management:  No medications ordered I have reviewed the patients home medicines and have made adjustments as needed   Test Considered:  No other test  considered or indicated   Critical Interventions:  None   Consultations Obtained:  No consultation required or indicated   Problem List / ED Course:  Patient presenting with right lower quadrant abdominal pain for the past 3 days.  She reports fever at home of 101, but is afebrile here.  Patient's exam reveals mild tenderness in the right lower quadrant, but there is no evidence for appendicitis on CT scan.  This just shows an ovarian cyst.  Patient will be treated with ibuprofen and tramadol.  She also describes a burning and acid taste in her mouth for which I will recommend Prilosec.  Patient seems appropriate for discharge with outpatient follow-up.   Reevaluation:  After the interventions noted above, I reevaluated the patient and found that they have :stayed the same   Social Determinants of Health:  None   Dispostion:  After consideration of the diagnostic results and the patients response to treatment, I feel that the patent would benefit from discharge to home with pain medication and as needed return..    Final Clinical Impression(s) / ED Diagnoses Final diagnoses:  None    Rx / DC Orders ED Discharge Orders     None         Geoffery Lyonselo, Lamona Eimer, MD 04/08/21 0210

## 2021-04-07 NOTE — ED Triage Notes (Signed)
Pt c/o fever, diarrhea, abdominal pain, sore throat, and a bump in her month. States she made an appointment with her PCP later this week but couldn't wait that long.  Afebrile in triage, denies taking any antipyretics prior to arrival.

## 2021-04-08 DIAGNOSIS — R1031 Right lower quadrant pain: Secondary | ICD-10-CM | POA: Diagnosis not present

## 2021-04-08 LAB — URINALYSIS, ROUTINE W REFLEX MICROSCOPIC
Bilirubin Urine: NEGATIVE
Glucose, UA: NEGATIVE mg/dL
Ketones, ur: NEGATIVE mg/dL
Leukocytes,Ua: NEGATIVE
Nitrite: NEGATIVE
Protein, ur: NEGATIVE mg/dL
Specific Gravity, Urine: 1.01 (ref 1.005–1.030)
pH: 6 (ref 5.0–8.0)

## 2021-04-08 LAB — COMPREHENSIVE METABOLIC PANEL
ALT: 16 U/L (ref 0–44)
AST: 17 U/L (ref 15–41)
Albumin: 4.8 g/dL (ref 3.5–5.0)
Alkaline Phosphatase: 71 U/L (ref 38–126)
Anion gap: 6 (ref 5–15)
BUN: 12 mg/dL (ref 6–20)
CO2: 25 mmol/L (ref 22–32)
Calcium: 9.2 mg/dL (ref 8.9–10.3)
Chloride: 102 mmol/L (ref 98–111)
Creatinine, Ser: 0.77 mg/dL (ref 0.44–1.00)
GFR, Estimated: >60 mL/min (ref >60)
Glucose, Bld: 93 mg/dL (ref 70–99)
Potassium: 3.7 mmol/L (ref 3.5–5.1)
Sodium: 133 mmol/L — ABNORMAL LOW (ref 135–145)
Total Bilirubin: 0.3 mg/dL (ref 0.3–1.2)
Total Protein: 8.7 g/dL — ABNORMAL HIGH (ref 6.5–8.1)

## 2021-04-08 LAB — URINALYSIS, MICROSCOPIC (REFLEX)

## 2021-04-08 LAB — RESP PANEL BY RT-PCR (FLU A&B, COVID) ARPGX2
Influenza A by PCR: NEGATIVE
Influenza B by PCR: NEGATIVE
SARS Coronavirus 2 by RT PCR: NEGATIVE

## 2021-04-08 LAB — PREGNANCY, URINE: Preg Test, Ur: NEGATIVE

## 2021-04-08 LAB — LIPASE, BLOOD: Lipase: 22 U/L (ref 11–51)

## 2021-04-08 MED ORDER — TRAMADOL HCL 50 MG PO TABS: 50.0000 mg | ORAL_TABLET | Freq: Four times a day (QID) | ORAL | 0 refills | Status: DC | PRN

## 2021-04-08 MED ORDER — IOHEXOL 300 MG/ML  SOLN
100.0000 mL | Freq: Once | INTRAMUSCULAR | Status: AC | PRN
  Administered 2021-04-08: 100 mL via INTRAVENOUS

## 2021-04-08 NOTE — ED Notes (Signed)
CT will get pt once pregnancy test results

## 2021-04-08 NOTE — ED Notes (Signed)
Patient transported to CT 

## 2021-04-08 NOTE — Discharge Instructions (Addendum)
Take ibuprofen 600 mg every 6 hours as needed for pain.  Begin taking tramadol as prescribed as needed for pain not relieved with ibuprofen.  Follow-up with primary doctor if symptoms are not improving in the next week, and return to the ER if you develop high fevers, bloody stools, worsening pain, or other new and concerning symptoms.

## 2021-04-09 ENCOUNTER — Telehealth: Payer: Self-pay

## 2021-04-09 NOTE — Telephone Encounter (Signed)
Transition Care Management Unsuccessful Follow-up Telephone Call  Date of discharge and from where:  04/08/2021 from [facility]  Attempts:  1st Attempt  Reason for unsuccessful TCM follow-up call:  Voice mail full

## 2021-04-10 ENCOUNTER — Other Ambulatory Visit: Payer: Self-pay

## 2021-04-10 ENCOUNTER — Encounter: Payer: Self-pay | Admitting: Family

## 2021-04-10 ENCOUNTER — Ambulatory Visit (INDEPENDENT_AMBULATORY_CARE_PROVIDER_SITE_OTHER): Payer: 59 | Admitting: Family

## 2021-04-10 VITALS — BP 109/75 | HR 74 | Temp 97.5°F | Ht 68.0 in | Wt 230.2 lb

## 2021-04-10 DIAGNOSIS — J020 Streptococcal pharyngitis: Secondary | ICD-10-CM

## 2021-04-10 DIAGNOSIS — Z419 Encounter for procedure for purposes other than remedying health state, unspecified: Secondary | ICD-10-CM | POA: Diagnosis not present

## 2021-04-10 HISTORY — DX: Streptococcal pharyngitis: J02.0

## 2021-04-10 LAB — POCT RAPID STREP A (OFFICE): Rapid Strep A Screen: POSITIVE — AB

## 2021-04-10 MED ORDER — AMOXICILLIN 500 MG PO CAPS
500.0000 mg | ORAL_CAPSULE | Freq: Two times a day (BID) | ORAL | 0 refills | Status: DC
Start: 2021-04-10 — End: 2021-04-22

## 2021-04-10 NOTE — Progress Notes (Signed)
Subjective:     Patient ID: Anna Andrews, female    DOB: December 10, 1995, 26 y.o.   MRN: [id number]  Chief Complaint  Patient presents with   Ear Pain   Sore Throat    Right side; started 2 weeks ago. She has taken Otc Tylenol. She says it started after trying a nicotine free vape.     HPI: Sore Throat: Patient complains of sore throat. Associated symptoms include post nasal drip and sore throat.Onset of symptoms was 2 weeks ago, gradually worsening since that time. She is not drinking much. She has not had recent close exposure to someone with proven streptococcal pharyngitis.   Health Maintenance Due  Topic Date Due   Hepatitis C Screening  Never done    Past Medical History:  Diagnosis Date   Acid reflux    Anxiety    Eczema    Gestational hypertension 04/27/2019   Supervision of normal first pregnancy 10/19/2018     FAMILY TREE  LAB RESULTS  Language English Pap 08/20/16 neg  Initiated care at [facility] GC/CT Initial: neg/neg        36wks: neg/neg  Dating by LMP c/w 9wk U/S    Support person Cori Razor NT/IT/AFP/MaterniT21:declined    [place]/HgbE neg  Flu vaccine 12/22/18  CF declined  TDaP vaccine 02/16/19  SMA declined  Rhogam N/A Fragile X declined       Anatomy US Normal female Blood Type A/Positive/-- (08/11     Past Surgical History:  Procedure Laterality Date   NO PAST SURGERIES      Outpatient Medications Prior to Visit  Medication Sig Dispense Refill   EPINEPHrine 0.3 mg/0.3 mL IJ SOAJ injection Inject 0.3 mg into the muscle as needed for anaphylaxis. 2 each 1   hydrocortisone cream 1 % Apply 1 application topically 2 (two) times daily as needed for itching (For eczema.).     sertraline (ZOLOFT) 50 MG tablet Take 1 tablet (50 mg total) by mouth daily. 30 tablet 11   lidocaine (XYLOCAINE) 2 % solution Use as directed 10 mLs in the mouth or throat as needed for mouth pain. (Patient not taking: Reported on 04/10/2021) 100 mL 0   meclizine (ANTIVERT) 25 MG tablet Take 1 tablet  (25 mg total) by mouth 3 (three) times daily as needed for dizziness. (Patient not taking: Reported on 04/10/2021) 30 tablet 0   albuterol (VENTOLIN HFA) 108 (90 Base) MCG/ACT inhaler Inhale 1-2 puffs into the lungs every 6 (six) hours as needed for wheezing or shortness of breath. (Patient not taking: Reported on 04/10/2021) 18 g 0   amoxicillin (AMOXIL) 875 MG tablet Take 1 tablet (875 mg total) by mouth 2 (two) times daily. (Patient not taking: Reported on 04/10/2021) 20 tablet 0   azelastine (ASTELIN) 0.1 % nasal spray Place 1 spray into both nostrils 2 (two) times daily. Use in each nostril as directed (Patient not taking: Reported on 04/10/2021) 30 mL 2   hydrochlorothiazide (HYDRODIURIL) 12.5 MG tablet Take 1 tablet (12.5 mg total) by mouth in the morning. (Patient not taking: Reported on 01/29/2021) 30 tablet 1   montelukast (SINGULAIR) 10 MG tablet Take 1 tablet (10 mg total) by mouth at bedtime. (Patient not taking: Reported on 04/10/2021) 30 tablet 2   predniSONE (DELTASONE) 20 MG tablet Take 2 tablets (40 mg total) by mouth daily with breakfast. (Patient not taking: Reported on 04/10/2021) 10 tablet 0   traMADol (ULTRAM) 50 MG tablet Take 1 tablet (50 mg total) by  mouth every 6 (six) hours as needed. (Patient not taking: Reported on 04/10/2021) 15 tablet 0   No facility-administered medications prior to visit.    No Known Allergies      Objective:    Physical Exam Vitals and nursing note reviewed.  Constitutional:      Appearance: Normal appearance.  HENT:     Right Ear: Tympanic membrane and ear canal normal.     Left Ear: Tympanic membrane and ear canal normal.     Mouth/Throat:     Mouth: Mucous membranes are moist.     Pharynx: Posterior oropharyngeal erythema present. No pharyngeal swelling, oropharyngeal exudate or uvula swelling.     Tonsils: No tonsillar exudate or tonsillar abscesses. 1+ on the right. 1+ on the left.  Cardiovascular:     Rate and Rhythm: Normal rate and regular  rhythm.  Pulmonary:     Effort: Pulmonary effort is normal.     Breath sounds: Normal breath sounds.  Musculoskeletal:        General: Normal range of motion.  Lymphadenopathy:     Cervical: No cervical adenopathy.  Skin:    General: Skin is warm and dry.  Neurological:     Mental Status: She is alert.  Psychiatric:        Mood and Affect: Mood normal.        Behavior: Behavior normal.    BP 109/75    Pulse 74    Temp (!) 97.5 F (36.4 C) (Temporal)    Ht 5\' 8"  (1.727 m)    Wt 230 lb 3.2 oz (104.4 kg)    LMP 03/15/2021 (Exact Date)    SpO2 98%    BMI 35.00 kg/m  Wt Readings from Last 3 Encounters:  04/10/21 230 lb 3.2 oz (104.4 kg)  04/07/21 240 lb (108.9 kg)  03/16/21 230 lb (104.3 kg)       Assessment & Plan:   Problem List Items Addressed This Visit       Respiratory   Strep sore throat - Primary    reports having ear infection beginning of month, finished AMOX course. Today reports ear, neck and throat hurting for about 2 weeks. Rapid strep positive. Advised she can also take Ibuprofen 600mg  3 times per day for sore throat pain, swelling, and fever. Gargle with warm salt water several times per day. OK to use over the counter Chloraseptic spray and/or throat lozenges as needed.       Relevant Medications   amoxicillin (AMOXIL) 500 MG capsule   Other Relevant Orders   POCT rapid strep A (Completed)    Meds ordered this encounter  Medications   amoxicillin (AMOXIL) 500 MG capsule    Sig: Take 1 capsule (500 mg total) by mouth 2 (two) times daily.    Dispense:  20 capsule    Refill:  0    Order Specific Question:   Supervising Provider    Answer:   ANDY, CAMILLE L [[id number]]

## 2021-04-10 NOTE — Patient Instructions (Signed)
It was very nice to see you today!  You are positive for strep A pharyngitis.  I have sent Amoxicillin to your pharmacy to take for 10 days. Be sure to finish this completely!   You can take up to 600mg  of generic Advil (Ibuprofen) 3 times per day to help with the pain and inflammation and warm salt water gargles several times per day.  Over the counter throat lozenges and /or chloraseptic spray may also be helpful.    PLEASE NOTE:  If you had any lab tests please let know if you have not heard back within a few days. You may see your results on MyChart before we have a chance to review them but we will give you a call once they are reviewed by [place]. If we ordered any referrals today, please let us know if you have not heard from their office within the next week.   Please try these tips to maintain a healthy lifestyle:  Eat most of your calories during the day when you are active. Eliminate processed foods including packaged sweets (pies, cakes, cookies), reduce intake of potatoes, white bread, white pasta, and white rice. Look for whole grain options, oat flour or almond flour.  Each meal should contain half fruits/vegetables, one quarter protein, and one quarter carbs (no bigger than a computer mouse).  Cut down on sweet beverages. This includes juice, soda, and sweet tea. Also watch fruit intake, though this is a healthier sweet option, it still contains natural sugar! Limit to 3 servings daily.  Drink at least 1 glass of water with each meal and aim for at least 8 glasses per day  Exercise at least 150 minutes every week.

## 2021-04-10 NOTE — Assessment & Plan Note (Signed)
reports having ear infection beginning of month, finished AMOX course. Today reports ear, neck and throat hurting for about 2 weeks. Rapid strep positive. Advised she can also take Ibuprofen 600mg  3 times per day for sore throat pain, swelling, and fever. Gargle with warm salt water several times per day. OK to use over the counter Chloraseptic spray and/or throat lozenges as needed.

## 2021-04-10 NOTE — Telephone Encounter (Signed)
Transition Care Management Unsuccessful Follow-up Telephone Call  Date of discharge and from where:  04/08/2021 from [facility]  Attempts:  2nd Attempt  Reason for unsuccessful TCM follow-up call:  Voice mail full

## 2021-04-11 NOTE — Telephone Encounter (Signed)
Transition Care Management Follow-up Telephone Call Date of discharge and from where: 04/08/2021-[place]  How have you been since you were released from the hospital? Pt stated she is doing fine and has followed up with her PCP on yesterday (04/10/2021) Any questions or concerns? No

## 2021-04-17 ENCOUNTER — Encounter (HOSPITAL_COMMUNITY): Payer: Self-pay | Admitting: *Deleted

## 2021-04-17 ENCOUNTER — Emergency Department (HOSPITAL_COMMUNITY)
Admission: EM | Admit: 2021-04-17 | Discharge: 2021-04-17 | Disposition: A | Discharge status: Home / Self Care | Payer: 59 | Attending: Emergency Medicine | Admitting: Emergency Medicine

## 2021-04-17 ENCOUNTER — Emergency Department (HOSPITAL_COMMUNITY): Payer: 59

## 2021-04-17 ENCOUNTER — Other Ambulatory Visit: Payer: Self-pay

## 2021-04-17 DIAGNOSIS — R42 Dizziness and giddiness: Secondary | ICD-10-CM | POA: Insufficient documentation

## 2021-04-17 DIAGNOSIS — R0789 Other chest pain: Secondary | ICD-10-CM | POA: Insufficient documentation

## 2021-04-17 DIAGNOSIS — R519 Headache, unspecified: Secondary | ICD-10-CM | POA: Diagnosis not present

## 2021-04-17 DIAGNOSIS — M549 Dorsalgia, unspecified: Secondary | ICD-10-CM | POA: Diagnosis not present

## 2021-04-17 DIAGNOSIS — R531 Weakness: Secondary | ICD-10-CM | POA: Insufficient documentation

## 2021-04-17 LAB — BASIC METABOLIC PANEL
Anion gap: 9 (ref 5–15)
BUN: 14 mg/dL (ref 6–20)
CO2: 25 mmol/L (ref 22–32)
Calcium: 9.6 mg/dL (ref 8.9–10.3)
Chloride: 102 mmol/L (ref 98–111)
Creatinine, Ser: 0.7 mg/dL (ref 0.44–1.00)
GFR, Estimated: >60 mL/min (ref >60)
Glucose, Bld: 80 mg/dL (ref 70–99)
Potassium: 3.9 mmol/L (ref 3.5–5.1)
Sodium: 136 mmol/L (ref 135–145)

## 2021-04-17 LAB — CBC WITH DIFFERENTIAL/PLATELET
Abs Immature Granulocytes: 0.03 10*3/uL (ref 0.00–0.07)
Basophils Absolute: 0.1 10*3/uL (ref 0.0–0.1)
Basophils Relative: 1 %
Eosinophils Absolute: 0.1 10*3/uL (ref 0.0–0.5)
Eosinophils Relative: 1 %
HCT: 40.1 % (ref 36.0–46.0)
Hemoglobin: 12.9 g/dL (ref 12.0–15.0)
Immature Granulocytes: 0 %
Lymphocytes Relative: 18 %
Lymphs Abs: 1.5 10*3/uL (ref 0.7–4.0)
MCH: 26.4 pg (ref 26.0–34.0)
MCHC: 32.2 g/dL (ref 30.0–36.0)
MCV: 82 fL (ref 80.0–100.0)
Monocytes Absolute: 0.3 10*3/uL (ref 0.1–1.0)
Monocytes Relative: 4 %
Neutro Abs: 6.6 10*3/uL (ref 1.7–7.7)
Neutrophils Relative %: 76 %
Platelets: 399 10*3/uL (ref 150–400)
RBC: 4.89 MIL/uL (ref 3.87–5.11)
RDW: 14 % (ref 11.5–15.5)
WBC: 8.7 10*3/uL (ref 4.0–10.5)
nRBC: 0 % (ref 0.0–0.2)

## 2021-04-17 NOTE — ED Provider Notes (Signed)
[facility] Provider Note   CSN: [id number] Arrival date & time: 04/17/21  1239     History  Chief Complaint  Patient presents with   multiple complaints    Anna Andrews is a 26 y.o. female.  HPI  Patient without medical history presents with multiple complaints  Headache dizziness-going on for last 3 months, it is intermittent, headache mainly on the right side of her head, not the worst headache of her life, resolve on its own, no associated paresthesias or weakness or change in vision no recent head trauma, not on anticoag, had recent head CT back in January which was unremarkable currently has been dizzy at this time Chest tightness-going on for last 3 months does not radiate, no associated diaphoresis nausea vomiting, not worse with exertion, describes as a pressure-like sensation, no cardiac history, no history of PEs or DVTs currently not on hormone therapy. Back pain-3 months time fell in the shower no head left, pain is mainly in her back does not radiate worse with move improved with rest no surgery.  There is weakness of the lower extremities.  Home Medications Prior to Admission medications   Medication Sig Start Date End Date Taking? Authorizing Provider  amoxicillin (AMOXIL) 500 MG capsule Take 1 capsule (500 mg total) by mouth 2 (two) times daily. 04/10/21   Jeanie Sewer, NP  EPINEPHrine 0.3 mg/0.3 mL IJ SOAJ injection Inject 0.3 mg into the muscle as needed for anaphylaxis. 03/11/21   Mesner, Corene Cornea, MD  hydrocortisone cream 1 % Apply 1 application topically 2 (two) times daily as needed for itching (For eczema.).    [provider]  lidocaine (XYLOCAINE) 2 % solution Use as directed 10 mLs in the mouth or throat as needed for mouth pain. Patient not taking: Reported on 04/10/2021 02/22/21   Volney American, PA-C  meclizine (ANTIVERT) 25 MG tablet Take 1 tablet (25 mg total) by mouth 3 (three) times daily as needed for  dizziness. Patient not taking: Reported on [id number]   Delora Fuel, MD  sertraline (ZOLOFT) 50 MG tablet Take 1 tablet (50 mg total) by mouth daily. 05/03/20   Cresenzo-Dishmon, Joaquim Lai, CNM  enalapril (VASOTEC) 5 MG tablet Take 1 tablet (5 mg total) by mouth daily. Patient not taking: Reported on 06/02/2019 05/05/19 11/02/19  Christin Fudge, CNM      Allergies    Patient has no known allergies.    Review of Systems   Review of Systems  Constitutional:  Negative for chills and fever.  Respiratory:  Negative for shortness of breath.   Cardiovascular:  Negative for chest pain.  Gastrointestinal:  Negative for abdominal pain.  Musculoskeletal:  Positive for back pain.  Neurological:  Positive for headaches.   Physical Exam Updated Vital Signs BP 133/83    Pulse 92    Temp 97.9 F (36.6 C) (Oral)    Resp 18    Ht 5\' 7"  (1.702 m)    Wt 104.3 kg    LMP 04/13/2021    SpO2 100%    BMI 36.02 kg/m  Physical Exam Vitals and nursing note reviewed.  Constitutional:      General: She is not in acute distress.    Appearance: She is not ill-appearing.  HENT:     Head: Normocephalic and atraumatic.     Nose: Congestion present.     Comments: Erythematous shortness bilaterally    Mouth/Throat:     Mouth: Mucous membranes are moist.  Pharynx: No oropharyngeal exudate or posterior oropharyngeal erythema.     Comments: Cobblestoning on the posterior pharynx Eyes:     Extraocular Movements: Extraocular movements intact.     Conjunctiva/sclera: Conjunctivae normal.     Pupils: Pupils are equal, round, and reactive to light.  Cardiovascular:     Rate and Rhythm: Normal rate and regular rhythm.     Pulses: Normal pulses.     Heart sounds: No murmur heard.   No friction rub. No gallop.  Pulmonary:     Effort: No respiratory distress.     Breath sounds: No wheezing, rhonchi or rales.  Abdominal:     Palpations: Abdomen is soft.     Tenderness: There is no abdominal  tenderness. There is no right CVA tenderness or left CVA tenderness.  Musculoskeletal:     Comments: Spine was palpated nontender to palpation no step-off deformities noted, has tenderness along her perispinal muscles of the thoracic spine, no overlying skin changes.  Skin:    General: Skin is warm and dry.     Comments: No erythematous joints present my exam.  Neurological:     Mental Status: She is alert.     GCS: GCS eye subscore is 4. GCS verbal subscore is 5. GCS motor subscore is 6.     Cranial Nerves: Cranial nerves 2-12 are intact.     Motor: Motor function is intact. No weakness.     Coordination: Romberg sign negative.     Gait: Gait is intact.     Comments: Critical nerves II through XII grossly intact no difficulty word finding, able to step commands, no lower weakness present.  Psychiatric:        Mood and Affect: Mood normal.    ED Results / Procedures / Treatments   Labs (all labs ordered are listed, but only abnormal results are displayed) Labs Reviewed  CBC WITH DIFFERENTIAL/PLATELET  BASIC METABOLIC PANEL    EKG EKG Interpretation  Date/Time:  Wednesday April 17 2021 13:30:26 EST Ventricular Rate:  84 PR Interval:  178 QRS Duration: 90 QT Interval:  360 QTC Calculation: 425 R Axis:   75 Text Interpretation: Normal sinus rhythm Normal ECG When compared with ECG of 26-Jan-2021 17:44, No significant change since last tracing Confirmed by Aletta Edouard [phone]) on 04/17/2021 3:56:11 PM  Radiology DG Thoracic Spine 2 View  Result Date: 04/17/2021 CLINICAL DATA:  Back pain for several months. Tenderness. No known injury. EXAM: THORACIC SPINE 2 VIEWS COMPARISON:  None. FINDINGS: There is no evidence of thoracic spine fracture. Alignment is normal. No other significant bone abnormalities are identified. IMPRESSION: Negative. Electronically Signed   By: Marlaine Hind M.D.   On: 04/17/2021 17:26    Procedures Procedures    Medications Ordered in ED Medications  - No data to display  ED Course/ Medical Decision Making/ A&P                           Medical Decision Making Amount and/or Complexity of Data Reviewed Labs: ordered. Radiology: ordered.   This patient presents to the ED for concern of multiple complaints, this involves an extensive number of treatment options, and is a complaint that carries with it a high risk of complications and morbidity.  The differential diagnosis includes ACS, CVA, spinal equina    Additional history obtained:  Additional history obtained from electronic medical record    Co morbidities that complicate the patient evaluation  Anxiety  Social Determinants of Health:  N/A    Lab Tests:  I Ordered, and personally interpreted labs.  The pertinent results include: CBC unremarkable, BMP unremarkable   Imaging Studies ordered:  I ordered imaging studies including thoracic spine I independently visualized and interpreted imaging which showed unremarkable I agree with the radiologist interpretation   Cardiac Monitoring:  The patient was maintained on a cardiac monitor.  I personally viewed and interpreted the cardiac monitored which showed an underlying rhythm of: EKG without signs of ischemia    Rule out low suspicion for internal head bleed,  mass, CVA no focal deficits, not on anticoag's no recent head trauma no focal deficits on my exam had normal CT head in January. Low suspicion for dissection of the vertebral or carotid artery as presentation atypical of etiology.  Low suspicion for meningitis as she has no meningeal sign present.I have low suspicion for ACS as history is atypical, patient has no cardiac history, EKG was sinus rhythm without signs of ischemia, troponins were deferred as she is having no active chest pain.  Low suspicion for PE as patient denies pleuritic chest pain, shortness of breath, patient denies leg pain, no pedal edema noted on exam, patient was PERC negative.  Low  suspicion for AAA or aortic dissection as history is atypical, patient has low risk factors.  Low suspicion for spinal equina she has no paresthesia or weakness upper lower extremities, full range of motion 5-5 strength in the upper and lower extremities.  Low suspicion for systemic infection as patient is nontoxic-appearing, vital signs reassuring, no obvious source infection noted on exam.      Dispostion and problem list  After consideration of the diagnostic results and the patients response to treatment, I feel that the patent would benefit from discharge.  Headache dizziness-unclear etiology possible due to sinuses were recommended decongestions follow-up with PCP Back pain-likely musculoskeletal over-the-counter pain medications follow-up with PCP Chest pain likely secondary due to sinusitis/postnasal drip decongestions follow-up with PCP.               Final Clinical Impression(s) / ED Diagnoses Final diagnoses:  Bad headache  Dizziness    Rx / DC Orders ED Discharge Orders     None         Marcello Fennel, PA-C 04/17/21 1823    Hayden Rasmussen, MD 04/18/21 1126

## 2021-04-17 NOTE — Discharge Instructions (Signed)
Likely a viral infection, recommend over-the-counter pain medications like ibuprofen Tylenol for fever and pain control, nasal decongestions like Flonase and Zyrtec, Mucinex for cough.  If not eating recommend supplementing with Gatorade to help with electrolyte supplementation.  Back pain-over-the-counter pain medications, likely muscular strain follow-up with your PCP as needed.  Follow-up PCP for further evaluation.  Come back to the emergency department if you develop chest pain, shortness of breath, severe abdominal pain, uncontrolled nausea, vomiting, diarrhea.

## 2021-04-17 NOTE — ED Notes (Signed)
Pt currently on antibiotics for positive strep 7 days ago.

## 2021-04-17 NOTE — ED Triage Notes (Signed)
Pt with HA off and on for months, chest pain mid to left off and on.  C/o stiff neck at times, sensitive to light and joint pain for months.  Pt states she has not felt well for months.  Decrease in appetite. + diarrhea.

## 2021-04-18 ENCOUNTER — Encounter: Payer: Self-pay | Admitting: Family

## 2021-04-18 ENCOUNTER — Telehealth: Payer: Self-pay | Admitting: *Deleted

## 2021-04-18 NOTE — Telephone Encounter (Signed)
Transition Care Management Unsuccessful Follow-up Telephone Call ° °Date of discharge and from where:  04/17/2021 - [facility] ° °Attempts:  1st Attempt ° °Reason for unsuccessful TCM follow-up call:  Left voice message ° °

## 2021-04-19 NOTE — Telephone Encounter (Signed)
Transition Care Management Follow-up Telephone Call Date of discharge and from where: 04/17/2021 from [facility] How have you been since you were released from the hospital? Patient stated that she is still not feeling well. Patient stated that her symptoms are the same. Patient stated  Any questions or concerns? No  Items Reviewed: Did the pt receive and understand the discharge instructions provided? Yes  Medications obtained and verified? Yes  Other? No  Any new allergies since your discharge? No  Dietary orders reviewed? No Do you have support at home? Yes   Functional Questionnaire: (I = Independent and D = Dependent) ADLs: I  Bathing/Dressing- I  Meal Prep- I  Eating- I  Maintaining continence- I  Transferring/Ambulation- I  Managing Meds- I   Follow up appointments reviewed:  PCP Hospital f/u appt confirmed? No   Specialist Hospital f/u appt confirmed? No   Are transportation arrangements needed? No  If their condition worsens, is the pt aware to call PCP or go to the Emergency Dept.? Yes Was the patient provided with contact information for the PCP's office or ED? Yes Was to pt encouraged to call back with questions or concerns? Yes

## 2021-04-22 ENCOUNTER — Encounter: Payer: Self-pay | Admitting: Family

## 2021-04-22 ENCOUNTER — Other Ambulatory Visit: Payer: Self-pay

## 2021-04-22 ENCOUNTER — Ambulatory Visit (INDEPENDENT_AMBULATORY_CARE_PROVIDER_SITE_OTHER): Payer: 59 | Admitting: Family

## 2021-04-22 VITALS — BP 112/82 | HR 102 | Temp 97.6°F | Wt 233.0 lb

## 2021-04-22 DIAGNOSIS — R5383 Other fatigue: Secondary | ICD-10-CM | POA: Diagnosis not present

## 2021-04-22 DIAGNOSIS — M2550 Pain in unspecified joint: Secondary | ICD-10-CM | POA: Diagnosis not present

## 2021-04-22 LAB — TSH: TSH: 0.8 u[IU]/mL (ref 0.35–5.50)

## 2021-04-22 LAB — SEDIMENTATION RATE: Sed Rate: 63 mm/hr — ABNORMAL HIGH (ref 0–20)

## 2021-04-22 LAB — T4, FREE: Free T4: 0.76 ng/dL (ref 0.60–1.60)

## 2021-04-22 NOTE — Progress Notes (Signed)
Subjective:     Patient ID: Anna Andrews, female    DOB: 1995-10-21, 26 y.o.   MRN: [id number]  Chief Complaint  Patient presents with   Generalized Body Aches    Aches and weakness  C/O lower extremity joint pain    Mouth Lesions    On going for 3 months, Will heal and go away     HPI: Fatigue Pt. reports onset: months ago. Hx of fatigue: no. Occurs upon awakening: no; during the day & evenings- yes. Hours of sleep: 6. Works during: day & 2nd shift. New medications or change in current med doses: no. Recent labs: no. Exercise: no. High carb/sugar diet: yes. Sx of depression: pt denies. Pain: She reports recurrent pain. There was not an injury that may have caused the pain. The pain started a few months ago and is  intermittent . The pain does not radiate. The pain is described as aching, soreness, and stiffness, is moderate in intensity, occurring intermittently. Symptoms are worse in the: evening, nighttime  Aggravating factors: standing She has tried NSAIDs with little relief.    Health Maintenance Due  Topic Date Due   Hepatitis C Screening  Never done    Past Medical History:  Diagnosis Date   Acid reflux    Anxiety    Eczema    Gestational hypertension 04/27/2019   Supervision of normal first pregnancy 10/19/2018     FAMILY TREE  LAB RESULTS  Language English Pap 08/20/16 neg  Initiated care at [facility] GC/CT Initial: neg/neg        36wks: neg/neg  Dating by LMP c/w 9wk U/S    Support person Cori Razor NT/IT/AFP/MaterniT21:declined    [place]/HgbE neg  Flu vaccine 12/22/18  CF declined  TDaP vaccine 02/16/19  SMA declined  Rhogam N/A Fragile X declined       Anatomy US Normal female Blood Type A/Positive/-- (08/11     Past Surgical History:  Procedure Laterality Date   NO PAST SURGERIES      Outpatient Medications Prior to Visit  Medication Sig Dispense Refill   EPINEPHrine 0.3 mg/0.3 mL IJ SOAJ injection Inject 0.3 mg into the muscle as needed for anaphylaxis. 2 each 1    hydrocortisone cream 1 % Apply 1 application topically 2 (two) times daily as needed for itching (For eczema.).     sertraline (ZOLOFT) 50 MG tablet Take 1 tablet (50 mg total) by mouth daily. 30 tablet 11   meclizine (ANTIVERT) 25 MG tablet Take 1 tablet (25 mg total) by mouth 3 (three) times daily as needed for dizziness. (Patient not taking: Reported on 04/10/2021) 30 tablet 0   amoxicillin (AMOXIL) 500 MG capsule Take 1 capsule (500 mg total) by mouth 2 (two) times daily. 20 capsule 0   lidocaine (XYLOCAINE) 2 % solution Use as directed 10 mLs in the mouth or throat as needed for mouth pain. (Patient not taking: Reported on 04/10/2021) 100 mL 0   No facility-administered medications prior to visit.    No Known Allergies      Objective:    Physical Exam Vitals and nursing note reviewed.  Constitutional:      Appearance: Normal appearance. She is obese.  Cardiovascular:     Rate and Rhythm: Normal rate and regular rhythm.  Pulmonary:     Effort: Pulmonary effort is normal.     Breath sounds: Normal breath sounds.  Musculoskeletal:        General: Normal range of motion.  Right shoulder: Normal range of motion.     Left shoulder: Normal range of motion.     Right knee: Normal range of motion.     Left knee: Normal range of motion.  Skin:    General: Skin is warm and dry.  Neurological:     Mental Status: She is alert.  Psychiatric:        Mood and Affect: Mood normal.        Behavior: Behavior normal.    BP 112/82    Pulse (!) 102    Temp 97.6 F (36.4 C) (Temporal)    Wt 233 lb (105.7 kg)    LMP 04/13/2021    SpO2 99%    BMI 36.49 kg/m  Wt Readings from Last 3 Encounters:  04/22/21 233 lb (105.7 kg)  04/17/21 230 lb (104.3 kg)  04/10/21 230 lb 3.2 oz (104.4 kg)       Assessment & Plan:   Problem List Items Addressed This Visit   None Visit Diagnoses     Fatigue, unspecified type    -  Primary - NEW   pt reports recovering from having strep throat, but having  continued fatigue and body aches, joint soreness which had started prior to being ill. pt works 2 jobs, mostly standing. Advised on taking frequent breaks to walk and stretch, hydrating well. Relevant Orders   T4, free   TSH   Autoimmune Profile (Completed)   Antinuclear Antib (ANA)   Extractable Nuclear antigen ab (Completed)   Jo-1 antibody-IgG (Completed)   Sedimentation rate   C-reactive protein   Rheumatoid Arthritis Profile (Completed)   Multiple joint pain  - NEW   pt reports mostly in her shoulders and knees, mild relief with Ibuprofen, but afraid to take every day. Relevant Orders   Autoimmune Profile (Completed)   Antinuclear Antib (ANA)   Extractable Nuclear antigen ab (Completed)   Jo-1 antibody-IgG (Completed)   Sedimentation rate   C-reactive protein   Rheumatoid Arthritis Profile (Completed)

## 2021-04-22 NOTE — Patient Instructions (Signed)
It was very nice to see you today!  As discussed, continue wearing your compression socks daily, they also make them to go over your knees, which may give you more pain relief in your legs. Take frequent breaks if standing stationery, walking around for a minute or 2 or sitting with your legs elevated. Follow up with your gynecologist regarding your ovarian cysts. Follow up with your dentist regarding the lesion in your lower lip.  Remember to drink at least 64oz water daily!  PLEASE NOTE:  If you had any lab tests please let us know if you have not heard back within a few days. You may see your results on MyChart before we have a chance to review them but we will give you a call once they are reviewed by [place]. If we ordered any referrals today, please let us know if you have not heard from their office within the next week.   Please try these tips to maintain a healthy lifestyle:  Eat most of your calories during the day when you are active. Eliminate processed foods including packaged sweets (pies, cakes, cookies), reduce intake of potatoes, white bread, white pasta, and white rice. Look for whole grain options, oat flour or almond flour.  Each meal should contain half fruits/vegetables, one quarter protein, and one quarter carbs (no bigger than a computer mouse).  Cut down on sweet beverages. This includes juice, soda, and sweet tea. Also watch fruit intake, though this is a healthier sweet option, it still contains natural sugar! Limit to 3 servings daily.  Drink at least 1 glass of water with each meal and aim for at least 8 glasses per day  Exercise at least 150 minutes every week.

## 2021-04-23 LAB — C-REACTIVE PROTEIN: CRP: 1.7 mg/dL (ref 0.5–20.0)

## 2021-04-24 ENCOUNTER — Encounter: Payer: Self-pay | Admitting: Family

## 2021-04-24 ENCOUNTER — Ambulatory Visit: Payer: 59 | Admitting: Family

## 2021-04-24 LAB — EXTRACTABLE NUCLEAR ANTIGEN ANTIBODY
ENA RNP Ab: <0.2 AI (ref 0.0–0.9)
ENA SM Ab Ser-aCnc: <0.2 AI (ref 0.0–0.9)
ENA SSA (RO) Ab: <0.2 AI (ref 0.0–0.9)
ENA SSB (LA) Ab: <0.2 AI (ref 0.0–0.9)
Scleroderma (Scl-70) (ENA) Antibody, IgG: <0.2 AI (ref 0.0–0.9)
dsDNA Ab: <1 IU/mL (ref 0–9)

## 2021-04-24 LAB — RHEUMATOID ARTHRITIS PROFILE
Cyclic Citrullin Peptide Ab: 17 units (ref 0–19)
Rheumatoid fact SerPl-aCnc: 11.3 IU/mL (ref <14.0)

## 2021-04-24 LAB — AUTOIMMUNE PROFILE
Anti Nuclear Antibody (ANA): NEGATIVE
Complement C3, Serum: 171 mg/dL — ABNORMAL HIGH (ref 82–167)

## 2021-04-25 ENCOUNTER — Encounter: Payer: Self-pay | Admitting: Family

## 2021-04-25 LAB — ANA: Anti Nuclear Antibody (ANA): NEGATIVE

## 2021-04-25 LAB — JO-1 ANTIBODY-IGG: Jo-1 Autoabs: <1 AI

## 2021-05-08 DIAGNOSIS — Z419 Encounter for procedure for purposes other than remedying health state, unspecified: Secondary | ICD-10-CM | POA: Diagnosis not present

## 2021-05-13 ENCOUNTER — Ambulatory Visit: Payer: 59 | Admitting: Family

## 2021-05-14 ENCOUNTER — Encounter: Payer: Self-pay | Admitting: Adult Health

## 2021-05-14 ENCOUNTER — Telehealth: Payer: Self-pay | Admitting: Adult Health

## 2021-05-14 ENCOUNTER — Ambulatory Visit (HOSPITAL_COMMUNITY)
Admission: RE | Admit: 2021-05-14 | Discharge: 2021-05-14 | Disposition: A | Discharge status: Home / Self Care | Payer: 59 | Source: Ambulatory Visit | Attending: Adult Health | Admitting: Adult Health

## 2021-05-14 ENCOUNTER — Other Ambulatory Visit: Payer: Self-pay

## 2021-05-14 ENCOUNTER — Ambulatory Visit (INDEPENDENT_AMBULATORY_CARE_PROVIDER_SITE_OTHER): Payer: 59 | Admitting: Adult Health

## 2021-05-14 VITALS — BP 122/85 | HR 100 | Ht 69.0 in | Wt 232.4 lb

## 2021-05-14 DIAGNOSIS — M79661 Pain in right lower leg: Secondary | ICD-10-CM

## 2021-05-14 DIAGNOSIS — R102 Pelvic and perineal pain unspecified side: Secondary | ICD-10-CM

## 2021-05-14 DIAGNOSIS — M545 Low back pain, unspecified: Secondary | ICD-10-CM | POA: Diagnosis not present

## 2021-05-14 DIAGNOSIS — N941 Unspecified dyspareunia: Secondary | ICD-10-CM

## 2021-05-14 DIAGNOSIS — R399 Unspecified symptoms and signs involving the genitourinary system: Secondary | ICD-10-CM | POA: Diagnosis not present

## 2021-05-14 DIAGNOSIS — N926 Irregular menstruation, unspecified: Secondary | ICD-10-CM | POA: Diagnosis not present

## 2021-05-14 DIAGNOSIS — Z3202 Encounter for pregnancy test, result negative: Secondary | ICD-10-CM

## 2021-05-14 DIAGNOSIS — N946 Dysmenorrhea, unspecified: Secondary | ICD-10-CM

## 2021-05-14 HISTORY — DX: Pelvic and perineal pain: R10.2

## 2021-05-14 HISTORY — DX: Irregular menstruation, unspecified: N92.6

## 2021-05-14 HISTORY — DX: Unspecified symptoms and signs involving the genitourinary system: R39.9

## 2021-05-14 HISTORY — DX: Pain in right lower leg: M79.661

## 2021-05-14 HISTORY — DX: Low back pain, unspecified: M54.50

## 2021-05-14 LAB — POCT URINALYSIS DIPSTICK OB
Glucose, UA: NEGATIVE
Ketones, UA: NEGATIVE
Leukocytes, UA: NEGATIVE
Nitrite, UA: NEGATIVE
POC,PROTEIN,UA: NEGATIVE

## 2021-05-14 LAB — POCT URINE PREGNANCY: Preg Test, Ur: NEGATIVE

## 2021-05-14 NOTE — Telephone Encounter (Signed)
Pt aware that US doppler right leg was normal, no DVT  ?

## 2021-05-14 NOTE — Progress Notes (Signed)
?  Subjective:  ?  ? Patient ID: Anna Andrews, female   DOB: 11/28/1995, 28 y.o.   MRN: [id number] ? ?HPI ?Anna Andrews is a 26 year old black female, with DP, G1P1 in complaining of pain with her periods, pain with sex, pain right calf for 2-3 months, has to elevated daily. ?Has not had period for this month, and back hurts, urination hurts at times. ?Periods heavy at times, may change pad every 2 hours. ?PCP is S Hudnell NP. ? ?Lab Results  ?Component Value Date  ? DIAGPAP  06/02/2019  ?  - Negative for intraepithelial lesion or malignancy (NILM)  ? HPVHIGH Negative 06/02/2019  ?  ?Review of Systems ?See HPI for positives ?Reviewed past medical,surgical, social and family history. Reviewed medications and allergies.  ?   ?Objective:  ? Physical Exam ?BP 122/85 (BP Location: Right Arm, Patient Position: Sitting, Cuff Size: Normal)   Pulse 100   Ht 5\' 9"  (1.753 m)   Wt 232 lb 6.4 oz (105.4 kg)   LMP 04/11/2021 (Exact Date)   BMI 34.32 kg/m?   ?  Urine dipstick small amount blood, UPT is negative. ?Skin warm and dry.Pelvic: external genitalia is normal in appearance no lesions, vagina: pink and moist,urethra has no lesions or masses noted, cervix:smooth and bulbous, uterus: normal size, shape and contour, mildly tender, no masses felt, adnexa: no masses, RLQ tenderness noted. Bladder is non tender and no masses felt.  ?Has pain right calf, has firmness lateral side, no redness or heat. ? [facility] - 05/14/21 1212   ? ?  ? Pregnancy Intention Screening  ? Does the patient want to become pregnant in the next year? No   ? Does the patient's partner want to become pregnant in the next year? No   ? Would the patient like to discuss contraceptive options today? No   ?  ? Contraception Wrap Up  ? Current Method Withdrawal or Other Method   ? End Method Withdrawal or Other Method   ? Contraception Counseling Provided No   ? ?  ?  ? ?  ? Examination chaperoned by 07/14/21 CMA ? ?Assessment:  ?   ?1. Symptoms of urinary tract  infection ?Has small amount of blood ?- POC Urinalysis Dipstick OB ? ?2. Acute right-sided low back pain, unspecified whether sciatica present ?- POC Urinalysis Dipstick OB ? ?3. Pelvic pain ?Wil get pelvic Federico Flake at [facility] 05/22/21 at 7:30 am to assess uterus  and ovaries  ?- POC Urinalysis Dipstick OB ?- 05/24/21 PELVIC COMPLETE WITH TRANSVAGINAL; Future ? ?4. Late period ?UPT is negative  ?- POCT urine pregnancy ? ?5. Dysmenorrhea ?- US PELVIC COMPLETE WITH TRANSVAGINAL; Future ? ?6. Dyspareunia in female ?- US PELVIC COMPLETE WITH TRANSVAGINAL; Future ? ?7. Right calf pain ?Will get [place] now at [facility] to rule out DVT  ?- [facility] Venous Img Lower Unilateral Right (DVT); Future  ?   ?Plan:  ?   ?Follow up in 2 weeks  ?   ?

## 2021-05-16 ENCOUNTER — Ambulatory Visit (INDEPENDENT_AMBULATORY_CARE_PROVIDER_SITE_OTHER): Payer: 59 | Admitting: Family

## 2021-05-16 ENCOUNTER — Encounter: Payer: Self-pay | Admitting: Family

## 2021-05-16 VITALS — BP 114/80 | HR 86 | Temp 97.7°F | Ht 69.0 in | Wt 238.1 lb

## 2021-05-16 DIAGNOSIS — R899 Unspecified abnormal finding in specimens from other organs, systems and tissues: Secondary | ICD-10-CM

## 2021-05-16 NOTE — Progress Notes (Signed)
? ?Subjective:  ? ? ? Patient ID: Anna Andrews, female    DOB: January 09, 1996, 26 y.o.   MRN: [id number] ? ?Chief Complaint  ?Patient presents with  ? Follow-up  ?  Discuss labs.  ? ?HPI: ?Lab follow up:  pt had slight elevations in her ESR and complement C3 and advised to return to see if back within normal range.  ? ? ?Health Maintenance Due  ?Topic Date Due  ? Hepatitis C Screening  Never done  ? ? ?Past Medical History:  ?Diagnosis Date  ? Acid reflux   ? Anxiety   ? Eczema   ? Gestational hypertension 04/27/2019  ? Supervision of normal first pregnancy 10/19/2018  ?   FAMILY TREE  LAB RESULTS  Language English Pap 08/20/16 neg  Initiated care at [facility] GC/CT Initial: neg/neg        36wks: neg/neg  Dating by LMP c/w 9wk U/S    Support person Martie Lee NT/IT/AFP/MaterniT21:declined    [place]/HgbE neg  Flu vaccine 12/22/18  CF declined  TDaP vaccine 02/16/19  SMA declined  Rhogam N/A Fragile X declined       Anatomy US Normal female Blood Type A/Positive/-- (08/11   ? ? ?Past Surgical History:  ?Procedure Laterality Date  ? NO PAST SURGERIES    ? ? ?Outpatient Medications Prior to Visit  ?Medication Sig Dispense Refill  ? EPINEPHrine 0.3 mg/0.3 mL IJ SOAJ injection Inject 0.3 mg into the muscle as needed for anaphylaxis. 2 each 1  ? hydrocortisone cream 1 % Apply 1 application. topically 2 (two) times daily as needed for itching (For eczema.).    ? sertraline (ZOLOFT) 50 MG tablet Take 1 tablet (50 mg total) by mouth daily. 30 tablet 11  ? meclizine (ANTIVERT) 25 MG tablet Take 1 tablet (25 mg total) by mouth 3 (three) times daily as needed for dizziness. 30 tablet 0  ? ?No facility-administered medications prior to visit.  ? ? ?Allergies  ?Allergen Reactions  ? Pineapple Swelling  ? ? ? ?   ?Objective:  ?  ?Physical Exam ?Vitals and nursing note reviewed.  ?Constitutional:   ?   Appearance: Normal appearance. She is obese.  ?Cardiovascular:  ?   Rate and Rhythm: Normal rate and regular rhythm.  ?Pulmonary:  ?   Effort:  Pulmonary effort is normal.  ?   Breath sounds: Normal breath sounds.  ?Musculoskeletal:     ?   General: Normal range of motion.  ?Skin: ?   General: Skin is warm and dry.  ?Neurological:  ?   Mental Status: She is alert.  ?Psychiatric:     ?   Mood and Affect: Mood normal.     ?   Behavior: Behavior normal.  ? ? ?BP 114/80 (BP Location: Left Arm, Patient Position: Sitting, Cuff Size: Large)   Pulse 86   Temp 97.7 ?F (36.5 ?C) (Temporal)   Ht 5' 9"  (1.753 m)   Wt 238 lb 2 oz (108 kg)   LMP  (Approximate)   SpO2 97%   BMI 35.16 kg/m?  ?Wt Readings from Last 3 Encounters:  ?05/16/21 238 lb 2 oz (108 kg)  ?05/14/21 232 lb 6.4 oz (105.4 kg)  ?04/22/21 233 lb (105.7 kg)  ? ? ?   ?Assessment & Plan:  ? ?Problem List Items Addressed This Visit   ?None ?Visit Diagnoses   ? ? Abnormal laboratory test result    -  Primary  ? slight elevations 2 weeks ago, advised  to return to recheck. 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 Seen a week ago by GYN, DVT testing negative, waiting on TVUS. neg for UTI and UPT, trace blood in urine. pt denies daily use of any NSAIDs.  ? ?Relevant Orders  ? Sedimentation rate  ? C3 complement  ? ?  ? ?Jeanie Sewer, NP ? ?

## 2021-05-17 ENCOUNTER — Encounter: Payer: Self-pay | Admitting: Family

## 2021-05-17 DIAGNOSIS — M2550 Pain in unspecified joint: Secondary | ICD-10-CM

## 2021-05-17 LAB — C3 COMPLEMENT: C3 Complement: 154 mg/dL (ref 83–193)

## 2021-05-17 LAB — SEDIMENTATION RATE: Sed Rate: 64 mm/hr — ABNORMAL HIGH (ref 0–20)

## 2021-05-22 ENCOUNTER — Encounter: Payer: Self-pay | Admitting: Family

## 2021-05-22 ENCOUNTER — Emergency Department (HOSPITAL_COMMUNITY): Payer: 59

## 2021-05-22 ENCOUNTER — Other Ambulatory Visit: Payer: Self-pay

## 2021-05-22 ENCOUNTER — Encounter (HOSPITAL_COMMUNITY): Payer: Self-pay

## 2021-05-22 ENCOUNTER — Emergency Department (HOSPITAL_COMMUNITY)
Admission: RE | Admit: 2021-05-22 | Discharge: 2021-05-22 | Disposition: A | Discharge status: Home / Self Care | Payer: 59 | Source: Ambulatory Visit | Attending: Adult Health | Admitting: Adult Health

## 2021-05-22 ENCOUNTER — Emergency Department (HOSPITAL_COMMUNITY)
Admission: EM | Admit: 2021-05-22 | Discharge: 2021-05-22 | Disposition: A | Discharge status: Home / Self Care | Payer: 59 | Attending: Emergency Medicine | Admitting: Emergency Medicine

## 2021-05-22 DIAGNOSIS — M545 Low back pain, unspecified: Secondary | ICD-10-CM | POA: Diagnosis present

## 2021-05-22 DIAGNOSIS — R946 Abnormal results of thyroid function studies: Secondary | ICD-10-CM | POA: Diagnosis not present

## 2021-05-22 DIAGNOSIS — N941 Unspecified dyspareunia: Secondary | ICD-10-CM

## 2021-05-22 DIAGNOSIS — R102 Pelvic and perineal pain: Secondary | ICD-10-CM

## 2021-05-22 DIAGNOSIS — R109 Unspecified abdominal pain: Secondary | ICD-10-CM | POA: Diagnosis not present

## 2021-05-22 DIAGNOSIS — N946 Dysmenorrhea, unspecified: Secondary | ICD-10-CM | POA: Insufficient documentation

## 2021-05-22 DIAGNOSIS — R300 Dysuria: Secondary | ICD-10-CM | POA: Diagnosis not present

## 2021-05-22 LAB — CBC WITH DIFFERENTIAL/PLATELET
Abs Immature Granulocytes: 0.03 10*3/uL (ref 0.00–0.07)
Basophils Absolute: 0.1 10*3/uL (ref 0.0–0.1)
Basophils Relative: 1 %
Eosinophils Absolute: 0.1 10*3/uL (ref 0.0–0.5)
Eosinophils Relative: 1 %
HCT: 41 % (ref 36.0–46.0)
Hemoglobin: 12.7 g/dL (ref 12.0–15.0)
Immature Granulocytes: 0 %
Lymphocytes Relative: 20 %
Lymphs Abs: 2 10*3/uL (ref 0.7–4.0)
MCH: 25.7 pg — ABNORMAL LOW (ref 26.0–34.0)
MCHC: 31 g/dL (ref 30.0–36.0)
MCV: 83 fL (ref 80.0–100.0)
Monocytes Absolute: 0.5 10*3/uL (ref 0.1–1.0)
Monocytes Relative: 4 %
Neutro Abs: 7.5 10*3/uL (ref 1.7–7.7)
Neutrophils Relative %: 74 %
Platelets: 385 10*3/uL (ref 150–400)
RBC: 4.94 MIL/uL (ref 3.87–5.11)
RDW: 14.3 % (ref 11.5–15.5)
WBC: 10.2 10*3/uL (ref 4.0–10.5)
nRBC: 0 % (ref 0.0–0.2)

## 2021-05-22 LAB — URINALYSIS, ROUTINE W REFLEX MICROSCOPIC
Bilirubin Urine: NEGATIVE
Glucose, UA: NEGATIVE mg/dL
Ketones, ur: NEGATIVE mg/dL
Nitrite: NEGATIVE
Protein, ur: NEGATIVE mg/dL
Specific Gravity, Urine: 1.02 (ref 1.005–1.030)
pH: 5 (ref 5.0–8.0)

## 2021-05-22 LAB — COMPREHENSIVE METABOLIC PANEL
ALT: 14 U/L (ref 0–44)
AST: 14 U/L — ABNORMAL LOW (ref 15–41)
Albumin: 4.5 g/dL (ref 3.5–5.0)
Alkaline Phosphatase: 68 U/L (ref 38–126)
Anion gap: 10 (ref 5–15)
BUN: 11 mg/dL (ref 6–20)
CO2: 25 mmol/L (ref 22–32)
Calcium: 9.3 mg/dL (ref 8.9–10.3)
Chloride: 103 mmol/L (ref 98–111)
Creatinine, Ser: 0.71 mg/dL (ref 0.44–1.00)
GFR, Estimated: >60 mL/min (ref >60)
Glucose, Bld: 84 mg/dL (ref 70–99)
Potassium: 3.8 mmol/L (ref 3.5–5.1)
Sodium: 138 mmol/L (ref 135–145)
Total Bilirubin: 0.3 mg/dL (ref 0.3–1.2)
Total Protein: 8.4 g/dL — ABNORMAL HIGH (ref 6.5–8.1)

## 2021-05-22 LAB — PREGNANCY, URINE: Preg Test, Ur: NEGATIVE

## 2021-05-22 MED ORDER — NITROFURANTOIN MONOHYD MACRO 100 MG PO CAPS: 100.0000 mg | ORAL_CAPSULE | Freq: Two times a day (BID) | ORAL | 0 refills | Status: DC

## 2021-05-22 NOTE — ED Triage Notes (Signed)
Pt presents to ED with complaints of mid lower back pain started 1-2 weeks ago, pt with urine urgency also.  ?

## 2021-05-22 NOTE — ED Provider Notes (Signed)
?[facility] ?Provider Note ? ? ?CSN: [id number] ?Arrival date & time: 05/22/21  1618 ? ?  ? ?History ? ?Chief Complaint  ?Patient presents with  ? Back Pain  ? ? ?Anna Andrews is a 26 y.o. female. ? ? ?Back Pain ?Associated symptoms: dysuria   ?Associated symptoms: no abdominal pain, no chest pain, no fever, no numbness and no weakness   ? ?  ? ? ?Anna Andrews is a 26 y.o. female who presents to the Emergency Department complaining of dysuria symptoms and urinary urgency with mid to lower back pain.  Symptoms have been present for 1 to 2 weeks.  Patient was seen here last month for middle back pain as well.  She was seen by OB/GYN on 05/14/2021.  She had pelvic ultrasound performed today.  Contacted the OB/GYN's office regarding her back pain and advised to come here for further evaluation.  Patient saw ultrasound results on MyChart and has concerns regarding possible cancer.  She describes aching pain to her mid to lower back that worsens with movement.  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  She describes having post void urgency and incomplete emptying of her bladder.  She denies any fever, chills, abnormal vaginal bleeding or discharge, nausea or vomiting.  No numbness or weakness of her lower extremities or bowel changes. ? ? ? ?Home Medications ?Prior to Admission medications   ?Medication Sig Start Date End Date Taking? Authorizing Provider  ?EPINEPHrine 0.3 mg/0.3 mL IJ SOAJ injection Inject 0.3 mg into the muscle as needed for anaphylaxis. 03/11/21   Mesner, Barbara Cower, MD  ?hydrocortisone cream 1 % Apply 1 application. topically 2 (two) times daily as needed for itching (For eczema.).    [provider]  ?sertraline (ZOLOFT) 50 MG tablet Take 1 tablet (50 mg total) by mouth daily. 05/03/20   Jacklyn Shell, CNM  ?enalapril (VASOTEC)  5 MG tablet Take 1 tablet (5 mg total) by mouth daily. ?Patient not taking: Reported on 06/02/2019 05/05/19 11/02/19  Jacklyn Shell, CNM  ?   ? ?Allergies    ?Pineapple   ? ?Review of Systems   ?Review of Systems  ?Constitutional:  Negative for chills and fever.  ?Respiratory:  Negative for shortness of breath.   ?Cardiovascular:  Negative for chest pain.  ?Gastrointestinal:  Negative for abdominal pain, nausea and vomiting.  ?Genitourinary:  Positive for dysuria, frequency and urgency. Negative for difficulty urinating, vaginal bleeding and vaginal discharge.  ?Musculoskeletal:  Positive for back pain.  ?Skin:  Negative for rash.  ?Neurological:  Negative for weakness and numbness.  ?All other systems reviewed and are negative. ? ?Physical Exam ?Updated Vital Signs ?BP (!) 141/79 (BP Location: Right Arm)   Pulse 86   Temp 97.8 ?F (36.6 ?C) (Oral)   Resp 17   Ht 5\' 9"  (1.753 m)   Wt 104.3 kg   LMP 05/15/2021   SpO2 100%   BMI 33.97 kg/m?  ?Physical Exam ?Vitals and nursing note reviewed.  ?Constitutional:   ?   General: She is not in acute distress. ?   Appearance: Normal appearance. She is not toxic-appearing.  ?Cardiovascular:  ?   Rate and Rhythm: Normal rate and regular rhythm.  ?   Pulses: Normal pulses.  ?Pulmonary:  ?   Effort: Pulmonary effort is normal.  ?Chest:  ?  Chest wall: No tenderness.  ?Abdominal:  ?   Palpations: Abdomen is soft.  ?   Tenderness: There is no abdominal tenderness. There is no right CVA tenderness or left CVA tenderness.  ?Musculoskeletal:     ?   General: Tenderness present. Normal range of motion.  ?   Comments: Diffuse tenderness of the bilateral lumbar paraspinal muscles.  She has some point tenderness to the mid thoracic spine as well.  This pain is reproduced with palpation over the area.  Hip flexors and extensors are intact.  ?Skin: ?   General: Skin is warm.  ?   Capillary Refill: Capillary refill takes less than 2 seconds.  ?Neurological:  ?   General: No  focal deficit present.  ?   Mental Status: She is alert.  ?   Sensory: No sensory deficit.  ?   Motor: No weakness.  ? ? ?ED Results / Procedures / Treatments   ?Labs ?(all labs ordered are listed, but only abnormal results are displayed) ?Labs Reviewed  ?URINALYSIS, ROUTINE W REFLEX MICROSCOPIC - Abnormal; Notable for the following components:  ?    Result Value  ? APPearance HAZY (*)   ? Hgb urine dipstick LARGE (*)   ? Leukocytes,Ua TRACE (*)   ? Bacteria, UA RARE (*)   ? All other components within normal limits  ?URINE CULTURE  ?PREGNANCY, URINE  ? ? ?EKG ?None ? ?Radiology ?US PELVIC COMPLETE WITH TRANSVAGINAL ? ?Result Date: 05/22/2021 ?CLINICAL DATA:  Dysmenorrhea for 6 months, LMP 05/17/2021 EXAM: TRANSABDOMINAL AND TRANSVAGINAL ULTRASOUND OF PELVIS TECHNIQUE: Both transabdominal and transvaginal ultrasound examinations of the pelvis were performed. Transabdominal technique was performed for global imaging of the pelvis including uterus, ovaries, adnexal regions, and pelvic cul-de-sac. It was necessary to proceed with endovaginal exam following the transabdominal exam to visualize the RIGHT ovary. COMPARISON:  01/22/2021 FINDINGS: Uterus Measurements: 8.5 x 5.9 x 5.0 cm = volume: 131 mL. Anteverted. Normal morphology without mass Endometrium Thickness: 20 mm. Small echogenic foci at upper uterine segment endometrium, question calcifications. No definite mass or fluid. Right ovary Measurements: 4.4 x 2.0 x 3.5 cm = volume: 16 mL. Normal morphology without mass Left ovary Measurements: 4.7 x 2.8 x 3.1 cm = volume: 21.5 mL. Normal morphology without mass Other findings No free pelvic fluid.  No adnexal masses. IMPRESSION: Thickened endometrial complex 20 mm thick containing a few small nonspecific calcifications; if bleeding remains unresponsive to hormonal or medical therapy, focal lesion work-up with sonohysterogram should be considered. Endometrial biopsy should also be considered in pre-menopausal patients  at high risk for endometrial carcinoma. (Ref: Radiological Reasoning: Algorithmic Workup of Abnormal Vaginal Bleeding with Endovaginal Sonography and Sonohysterography. AJR 2008; 161:W96-04; 191:S68-73). Remainder of exam normal. Electronically Signed   By: Ulyses SouthwardMark  Boles M.D.   On: 05/22/2021 09:23   ? ?Procedures ?Procedures  ? ? ?Medications Ordered in ED ?Medications - No data to display ? ?ED Course/ Medical Decision Making/ A&P ?  ?                        ?Medical Decision Making ?Amount and/or Complexity of Data Reviewed ?Labs: ordered. ?Radiology: ordered. ?   Details: Pelvic ultrasound results from earlier today were reviewed by me. ? ? ?Patient here for evaluation of back pain x2 weeks.  Seen on 05/14/2021 by OB/GYN.  She had pelvic ultrasound earlier today and contacted OB/GYN's office and advised to come to the ER for further evaluation of her back  pain. ? ?On exam, patient is well-appearing.  Nontoxic.  Vital signs are reassuring.  She appears very anxious and has multiple concerns of possibility of cancer.  I have tried to console patient and reassure her.  She has upcoming appointment with OB/GYN on 05/29/2021.  Is not currently on any birth control.  Low clinical suspicion for acute intra-abdominal or pelvic process. ? ?She does complain of feeling of incomplete emptying of her bladder and postvoid urgency.  Possibly secondary to developing urinary tract infection.  Plan will be to obtain CT renal stone study for further evaluation of her back pain.  If negative, will treat with antibiotics, NSAID, and muscle relaxer.  She will likely be discharged home. ? ?CT results pending at end of shift, discussed findings with Kathie Dike, PA-C who agrees to review results and arrange appropriate disposition. ? ? ? ? ? ? ? ?Final Clinical Impression(s) / ED Diagnoses ?Final diagnoses:  ?Acute bilateral low back pain without sciatica  ?Dysuria  ? ? ?Rx / DC Orders ?ED Discharge Orders   ? ? None  ? ?  ? ? ?  ?Pauline Aus, PA-C ?05/22/21 1935 ? ?  ?Gerhard Munch, MD ?05/23/21 2324 ? ?

## 2021-05-22 NOTE — ED Provider Notes (Signed)
?Physical Exam  ?BP (!) 141/79 (BP Location: Right Arm)   Pulse 86   Temp 97.8 ?F (36.6 ?C) (Oral)   Resp 17   Ht 5\' 9"  (1.753 m)   Wt 104.3 kg   LMP 05/15/2021   SpO2 100%   BMI 33.97 kg/m?  ? ?Physical Exam ?Vitals and nursing note reviewed.  ?Constitutional:   ?   General: She is not in acute distress. ?   Appearance: She is well-developed. She is not ill-appearing or diaphoretic.  ?HENT:  ?   Head: Normocephalic and atraumatic.  ?   Nose: Nose normal.  ?Eyes:  ?   General: No scleral icterus. ?   Conjunctiva/sclera: Conjunctivae normal.  ?Cardiovascular:  ?   Rate and Rhythm: Normal rate and regular rhythm.  ?   Pulses: Normal pulses.  ?   Heart sounds: No murmur heard. ?Pulmonary:  ?   Effort: Pulmonary effort is normal. No respiratory distress.  ?   Breath sounds: Normal breath sounds.  ?Abdominal:  ?   Palpations: Abdomen is soft.  ?   Tenderness: There is abdominal tenderness (Mild).  ?Musculoskeletal:  ?   Cervical back: Neck supple.  ?   Comments: Lumbar paraspinous muscles TTP ?Negative for obvious deformity, swelling, decreased ROM, scoliosis, bony tenderness, trauma, or erythema ?Negative for radiculopathy  ?Skin: ?   General: Skin is warm and dry.  ?   Capillary Refill: Capillary refill takes less than 2 seconds.  ?Neurological:  ?   General: No focal deficit present.  ?   Mental Status: She is alert and oriented to person, place, and time.  ?Psychiatric:     ?   Mood and Affect: Mood is anxious.  ? ? ?Procedures  ?Procedures ? ?ED Course / MDM  ?  ?Medical Decision Making ?Amount and/or Complexity of Data Reviewed ?Labs: ordered. ?Radiology: ordered. ? ?Risk ?Prescription drug management. ? ? ?26 y.o. female presents to the ED for concern of Back Pain ? ?Comorbidities that complicate the patient evaluation include anxiety ? ?Additional history obtained from internal/external records available via epic ? ?Interpretation: ?I personally interpreted labs.  The pertinent results include:  negative  pregnancy. WBC not elevated and no evidence of anemia.  Electrolytes, kidney function, and LFTs unremarkable.  Trace leukocytes and bacteria appreciated in urinalysis. ? ?I independently visualized and interpreted CT imaging renal study which showed negative for bladder or urinary obstruction, but mild enlarging of the bladder walls suggesting cystitis.  I agree with the radiologist interpretation ? ?ED Course: ?Assumed care of patient from another provider.  Reviewed labs, imaging, and provided individual physical exam of patient.  Pt appears stable with physical findings described above.  Not suspicious of acute emergent abdominal pathology such as appendicitis, bowel obstruction, or renal calculi.  Pt appears anxious on physical exam.   Concerned she may have cancer and wants to know definitively.  Discussed image findings at length with the pt.  Enlarged bladder with mild back tenderness and urinary symptoms suggestive of cystitis.  Low concern suggest pyelonephritis based on imaging, history, and physical exam.  No concern for cauda equina or spinal fracture.  Recommended outpatient antibiotic therapy and prompt follow up with PCP/OBGYN for further eval of outside previous imaging.  Pt has follow up scheduled with OGBYN in 1-2 weeks.  Patient agreeable to this. ? ?Emergency department workup does not suggest an emergent condition requiring admission or immediate intervention beyond  what has been performed at this time.  The patient is  safe for discharge and has been instructed to return immediately for worsening symptoms, change in symptoms or any other concerns ? ?Disposition: ?I discussed the patient and their case with my attending, Dr. Jeraldine Loots, who agreed with the proposed treatment course.  After consideration of the diagnostic results and the patient's response to treatment, I feel that the patient would benefit from outpatient antibiotic treatment and follow up with primary care/OBGYN.  Discussed  course of treatment thoroughly with the patient and she demonstrated understanding.  Patient in agreement and has no further questions. ? ?This chart was dictated using voice recognition software.  Despite best efforts to proofread,  errors can occur which can change the documentation meaning. ? ? ? ? ?  ?Cecil Cobbs, PA-C ?05/24/21 2458 ? ?  ?Gerhard Munch, MD ?05/24/21 2315 ? ?

## 2021-05-22 NOTE — Discharge Instructions (Addendum)
A prescription for an antibiotic by the name of Macrobid has been sent to your pharmacy.  Please take 1 pill twice a day for 5 days or until the course is complete. ? ?You may also utilize over-the-counter NSAIDs for inflammation reduction and for pain relief. ? ?Keep your follow-up appointment with your OBGYN 27th ? ?Follow up with your primary care in the next 4-5 days for reevaluation and continued medical management. ? ?Return to the ED for new or worsening symptoms as discussed. ?

## 2021-05-23 ENCOUNTER — Encounter: Payer: Self-pay | Admitting: Family

## 2021-05-23 ENCOUNTER — Telehealth: Payer: Self-pay

## 2021-05-23 NOTE — Telephone Encounter (Signed)
Transition Care Management Unsuccessful Follow-up Telephone Call ? ?Date of discharge and from where:  05/22/2021-[facility]  ? ?Attempts:  1st Attempt ? ?Reason for unsuccessful TCM follow-up call:  Left voice message ? ?  ?

## 2021-05-24 ENCOUNTER — Encounter: Payer: Self-pay | Admitting: Family

## 2021-05-24 LAB — URINE CULTURE: Culture: 40000 — AB

## 2021-05-24 NOTE — Telephone Encounter (Signed)
Transition Care Management Follow-up Telephone Call ?Date of discharge and from where: 05/22/2021-[facility] ?How have you been since you were released from the hospital? Pt stated she is doing fine. ?Any questions or concerns? No ? ?Items Reviewed: ?Did the pt receive and understand the discharge instructions provided? Yes  ?Medications obtained and verified? Yes  ?Other? No  ?Any new allergies since your discharge? No  ?Dietary orders reviewed? No ?Do you have support at home? Yes  ? ?Home Care and Equipment/Supplies: ?Were home health services ordered? not applicable ?If so, what is the name of the agency? N/A  ?Has the agency set up a time to come to the patient's home? not applicable ?Were any new equipment or medical supplies ordered?  No ?What is the name of the medical supply agency? N/A ?Were you able to get the supplies/equipment? not applicable ?Do you have any questions related to the use of the equipment or supplies? No ? ?Functional Questionnaire: (I = Independent and D = Dependent) ?ADLs: I ? ?Bathing/Dressing- I ? ?Meal Prep- I ? ?Eating- I ? ?Maintaining continence- I ? ?Transferring/Ambulation- I ? ?Managing Meds- I ? ?Follow up appointments reviewed: ? ?PCP Hospital f/u appt confirmed? No   ?Specialist Hospital f/u appt confirmed? Yes  Scheduled to see OBGYN on 05/29/2021. ?Are transportation arrangements needed? No  ?If their condition worsens, is the pt aware to call PCP or go to the Emergency Dept.? Yes ?Was the patient provided with contact information for the PCP's office or ED? Yes ?Was to pt encouraged to call back with questions or concerns? Yes  ?

## 2021-05-25 ENCOUNTER — Telehealth: Payer: Self-pay

## 2021-05-25 NOTE — Telephone Encounter (Signed)
Post ED Visit - Positive Culture Follow-up ? ?Culture report reviewed by antimicrobial stewardship pharmacist: ?[facility] Team ?[x]  Bertis Ruddy, Pharm.D. ?[]  Heide Guile, Pharm.D., BCPS AQ-ID ?[]  Parks Neptune, Pharm.D., BCPS ?[]  Alycia Rossetti, Pharm.D., BCPS ?[]  Canones, Pharm.D., BCPS, AAHIVP ?[]  Legrand Como, Pharm.D., BCPS, AAHIVP ?[]  Salome Arnt, PharmD, BCPS ?[]  Johnnette Gourd, PharmD, BCPS ?[]  Hughes Better, PharmD, BCPS ?[]  Leeroy Cha, PharmD ?[]  Laqueta Linden, PharmD, BCPS ?[]  Albertina Parr, PharmD ? ?[place] Team ?[]  Leodis Sias, PharmD ?[]  Lindell Spar, PharmD ?[]  Royetta Asal, PharmD ?[]  Graylin Shiver, Rph ?[]  Rema Fendt) Glennon Mac, PharmD ?[]  Arlyn Dunning, PharmD ?[]  Netta Cedars, PharmD ?[]  Dia Sitter, PharmD ?[]  Leone Haven, PharmD ?[]  Gretta Arab, PharmD ?[]  Theodis Shove, PharmD ?[]  Peggyann Juba, PharmD ?[]  Reuel Boom, PharmD ? ? ?Positive urine culture ?Treated with Nitrofurantoin Monohyd Macro, organism sensitive to the same and no further patient follow-up is required at this time. ? ?Glennon Hamilton ?05/25/2021, 12:39 PM ?  ?

## 2021-05-29 ENCOUNTER — Encounter: Payer: Self-pay | Admitting: Adult Health

## 2021-05-29 ENCOUNTER — Other Ambulatory Visit: Payer: Self-pay

## 2021-05-29 ENCOUNTER — Ambulatory Visit (INDEPENDENT_AMBULATORY_CARE_PROVIDER_SITE_OTHER): Payer: 59 | Admitting: Adult Health

## 2021-05-29 VITALS — BP 110/71 | HR 78 | Ht 69.0 in | Wt 237.0 lb

## 2021-05-29 DIAGNOSIS — N946 Dysmenorrhea, unspecified: Secondary | ICD-10-CM

## 2021-05-29 DIAGNOSIS — Z87440 Personal history of urinary (tract) infections: Secondary | ICD-10-CM | POA: Diagnosis not present

## 2021-05-29 DIAGNOSIS — Z7689 Persons encountering health services in other specified circumstances: Secondary | ICD-10-CM

## 2021-05-29 DIAGNOSIS — N920 Excessive and frequent menstruation with regular cycle: Secondary | ICD-10-CM | POA: Diagnosis not present

## 2021-05-29 HISTORY — DX: Persons encountering health services in other specified circumstances: Z76.89

## 2021-05-29 LAB — POCT URINALYSIS DIPSTICK
Blood, UA: NEGATIVE
Glucose, UA: NEGATIVE
Leukocytes, UA: NEGATIVE
Nitrite, UA: NEGATIVE
Protein, UA: NEGATIVE

## 2021-05-29 LAB — POCT URINE PREGNANCY: Preg Test, Ur: NEGATIVE

## 2021-05-29 MED ORDER — SLYND 4 MG PO TABS: ORAL_TABLET | ORAL | 0 refills | Status: DC

## 2021-05-29 NOTE — Progress Notes (Signed)
?  Subjective:  ?  ? Patient ID: Anna Andrews, female   DOB: 1996/02/09, 26 y.o.   MRN: [id number] ? ?HPI ?Hebe is a 26 year old black female,with DP, G1P1 back in follow up on [place] , done for pain, and bleeding.  ?She was seen in ER 05/22/21 and treated for UTI, had ?cystitis on renal US. ?PCP is Marlyn Corporal, NP ?Lab Results  ?Component Value Date  ? DIAGPAP  06/02/2019  ?  - Negative for intraepithelial lesion or malignancy (NILM)  ? HPVHIGH Negative 06/02/2019  ?  ? ?Review of Systems ?Periods heavy at times ?Has cramps ?Hx migraine ?aura  ?  Periods regular ?Reviewed past medical,surgical, social and family history. Reviewed medications and allergies.  ?Objective:  ? Physical Exam ?BP 110/71 (BP Location: Left Arm, Patient Position: Sitting, Cuff Size: Normal)   Pulse 78   Ht 5\' 9"  (1.753 m)   Wt 237 lb (107.5 kg)   LMP 05/15/2021   BMI 35.00 kg/m?   ?  UPT is negative, urine dipstick negative.  ?Skin warm and dry.  Lungs: clear to ausculation bilaterally. Cardiovascular: regular rate and rhythm.  ?07/15/2021 reviewed: ?Uterus ?  ?Measurements: 8.5 x 5.9 x 5.0 cm = volume: 131 mL. Anteverted. ?Normal morphology without mass ?  ?Endometrium ?  ?Thickness: 20 mm. Small echogenic foci at upper uterine segment ?endometrium, question calcifications. No definite mass or fluid. ?  ?Right ovary ?  ?Measurements: 4.4 x 2.0 x 3.5 cm = volume: 16 mL. Normal morphology ?without mass ?Left ovary ?  ?Measurements: 4.7 x 2.8 x 3.1 cm = volume: 21.5 mL. Normal ?morphology without mass ?  ?Other findings ?  ?No free pelvic fluid.  No adnexal masses. ?  ?IMPRESSION: ?Thickened endometrial complex 20 mm thick containing a few small ?nonspecific calcifications; if bleeding remains unresponsive to ?hormonal or medical therapy, focal lesion work-up with ?sonohysterogram should be considered. Endometrial biopsy should also ?be considered in pre-menopausal patients at high risk for ?endometrial carcinoma. (Ref: Radiological Reasoning:  Algorithmic ?Workup of Abnormal Vaginal Bleeding with Endovaginal Sonography and ?Sonohysterography. AJR 200806-11-1999). ? Up[facility] - 05/29/21 1107   ? ?  ? Pregnancy Intention Screening  ? Does the patient want to become pregnant in the next year? No   ? Does the patient's partner want to become pregnant in the next year? No   ? Would the patient like to discuss contraceptive options today? No   ?  ? Contraception Wrap Up  ? Current Method Withdrawal or Other Method   ? End Method Oral Contraceptive;Female Condom   ? Contraception Counseling Provided Yes   ? ?  ?  ? ?  ?  ?Assessment:  ?   ? 1. Menorrhagia with regular cycle ?Will try POP ?Hx migraine ?aura ? ?2. Dysmenorrhea ? ?3. Encounter for menstrual regulation ?UPT is negative  ?Will try slynd, 4 packs given, can start today ?Use condoms  ?Take same time every day  ? ? ?4. History of cystitis ?Urine dipstick negative  ?   ?Plan:  ?   ?Follow up in 3 months  ?Will get repeat 05/31/21 then to assess lining if no change in bleeding  ?   ?

## 2021-05-30 ENCOUNTER — Ambulatory Visit (INDEPENDENT_AMBULATORY_CARE_PROVIDER_SITE_OTHER): Payer: 59 | Admitting: Family

## 2021-05-30 ENCOUNTER — Encounter: Payer: Self-pay | Admitting: Family

## 2021-05-30 VITALS — BP 108/71 | HR 85 | Temp 98.2°F | Ht 68.0 in | Wt 235.4 lb

## 2021-05-30 DIAGNOSIS — K2100 Gastro-esophageal reflux disease with esophagitis, without bleeding: Secondary | ICD-10-CM

## 2021-05-30 MED ORDER — PANTOPRAZOLE SODIUM 20 MG PO TBEC: 20.0000 mg | DELAYED_RELEASE_TABLET | Freq: Every day | ORAL | 0 refills | Status: DC

## 2021-05-30 NOTE — Assessment & Plan Note (Signed)
not currently treating, but having current pain in left chest radiating to left neck, shoulder, down under breast, LUQ, boring into her back. Feels frequently, not just after eating. Reports eating more fruit lately, advised it is highly acidic and can bring on sx. Starting Protonix, advised on use & SE, f/u as needed if not helping sx. ?

## 2021-05-30 NOTE — Patient Instructions (Signed)
I have sent generic Protonix for you to start for your reflux symptoms. ?Start with 1 pill twice a day for 2 weeks, then reduce to 1 pill daily. ?Reduce the acid in your diet, this includes fruit and juices, tomatoes and tomato based sauces, caffeine, and smoking. ? ?Glad to hear you are down to 1 job, I think this will improve your health overall! Less tired, less leg pain, less stress!  ? ?Stay well! ?Judeth Cornfield ?

## 2021-05-30 NOTE — Progress Notes (Signed)
? ?Subjective:  ? ? ? Patient ID: Anna Andrews, female    DOB: January 12, 1996, 26 y.o.   MRN: [id number] ? ?Chief Complaint  ?Patient presents with  ? Chest, shoulder, upper abdomen pain  ? Gastroesophageal Reflux  ? ?HPI: ?GERD: Patient is presenting for new symptoms. This has been associated with belching, chest pain, deep pressure at base of neck, heartburn, midespigastric pain, and regurgitation of undigested food.  She denies choking on food, cough, dysphagia, hoarseness, and nausea. Symptoms have been present for 2 months. She denies dysphagia.  She has not lost weight. She denies melena, hematochezia, hematemesis, and coffee ground emesis. Medical therapy in the past has included antacids and H2 antagonists. ? ? ?Health Maintenance Due  ?Topic Date Due  ? Hepatitis C Screening  Never done  ? ? ?Past Medical History:  ?Diagnosis Date  ? Anxiety   ? Eczema   ? Gestational hypertension 04/27/2019  ? Supervision of normal first pregnancy 10/19/2018  ?   FAMILY TREE  LAB RESULTS  Language English Pap 08/20/16 neg  [facility] at [facility] GC/CT Initial: neg/neg        36wks: neg/neg  Dating by LMP c/w 9wk U/S    Support person Martie Lee NT/IT/AFP/MaterniT21:declined    [place]/HgbE neg  Flu vaccine 12/22/18  CF declined  TDaP vaccine 02/16/19  SMA declined  Rhogam N/A Fragile X declined       Anatomy US Normal female Blood Type A/Positive/-- (08/11   ? ? ?Past Surgical History:  ?Procedure Laterality Date  ? NO PAST SURGERIES    ? ? ?Outpatient Medications Prior to Visit  ?Medication Sig Dispense Refill  ? AZO-CRANBERRY PO Take by mouth.    ? Drospirenone (SLYND) 4 MG TABS Take 1 daily 112 tablet 0  ? EPINEPHrine 0.3 mg/0.3 mL IJ SOAJ injection Inject 0.3 mg into the muscle as needed for anaphylaxis. 2 each 1  ? hydrocortisone cream 1 % Apply 1 application. topically 2 (two) times daily as needed for itching (For eczema.).    ? sertraline (ZOLOFT) 50 MG tablet Take 1 tablet (50 mg total) by mouth daily. 30 tablet 11  ?  nitrofurantoin, macrocrystal-monohydrate, (MACROBID) 100 MG capsule Take 1 capsule (100 mg total) by mouth 2 (two) times daily. 10 capsule 0  ? ?No facility-administered medications prior to visit.  ? ? ?Allergies  ?Allergen Reactions  ? Pineapple Swelling  ? ? ?   ?Objective:  ?  ?Physical Exam ?Vitals and nursing note reviewed.  ?Constitutional:   ?   Appearance: Normal appearance.  ?Cardiovascular:  ?   Rate and Rhythm: Normal rate and regular rhythm.  ?Pulmonary:  ?   Effort: Pulmonary effort is normal.  ?   Breath sounds: Normal breath sounds.  ?Musculoskeletal:     ?   General: Normal range of motion.  ?Skin: ?   General: Skin is warm and dry.  ?Neurological:  ?   Mental Status: She is alert.  ?Psychiatric:     ?   Mood and Affect: Mood normal.     ?   Behavior: Behavior normal.  ? ? ?BP 108/71 (BP Location: Left Arm, Patient Position: Sitting, Cuff Size: Large)   Pulse 85   Temp 98.2 ?F (36.8 ?C) (Temporal)   Ht 5\' 8"  (1.727 m)   Wt 235 lb 6 oz (106.8 kg)   LMP 05/15/2021   SpO2 99%   BMI 35.79 kg/m?  ?Wt Readings from Last 3 Encounters:  ?05/30/21 235 lb  6 oz (106.8 kg)  ?05/29/21 237 lb (107.5 kg)  ?05/22/21 230 lb (104.3 kg)  ? ?   ?Assessment & Plan:  ? ?Problem List Items Addressed This Visit   ? ?  ? Digestive  ? Gastroesophageal reflux disease with esophagitis without hemorrhage - Primary  ?  not currently treating, but having current pain in left chest radiating to left neck, shoulder, down under breast, LUQ, boring into her back. Feels frequently, not just after eating. Reports eating more fruit lately, advised it is highly acidic and can bring on sx. Starting Protonix, advised on use & SE, f/u as needed if not helping sx. ?  ?  ? Relevant Medications  ? pantoprazole (PROTONIX) 20 MG tablet  ? ? ?Meds ordered this encounter  ?Medications  ? pantoprazole (PROTONIX) 20 MG tablet  ?  Sig: Take 1 tablet (20 mg total) by mouth daily. Take 1 pill twice a day for 2 weeks, then 1 pill every morning.   ?  Dispense:  45 tablet  ?  Refill:  0  ?  Order Specific Question:   Supervising Provider  ?  Answer:   ANDY, CAMILLE L [[id number]]  ? ? ?Jeanie Sewer, NP ? ?

## 2021-06-08 DIAGNOSIS — Z419 Encounter for procedure for purposes other than remedying health state, unspecified: Secondary | ICD-10-CM | POA: Diagnosis not present

## 2021-06-10 ENCOUNTER — Other Ambulatory Visit: Payer: Self-pay | Admitting: *Deleted

## 2021-06-10 ENCOUNTER — Other Ambulatory Visit: Payer: Self-pay | Admitting: Adult Health

## 2021-06-10 MED ORDER — SERTRALINE HCL 50 MG PO TABS: 50.0000 mg | ORAL_TABLET | Freq: Every day | ORAL | 6 refills | Status: DC

## 2021-06-10 NOTE — Progress Notes (Signed)
Refilled zoloft 50 mg  ?

## 2021-06-18 NOTE — Telephone Encounter (Signed)
she needs to do a virtual visit

## 2021-06-19 ENCOUNTER — Other Ambulatory Visit: Payer: Self-pay

## 2021-06-19 ENCOUNTER — Emergency Department (HOSPITAL_COMMUNITY)
Admission: EM | Admit: 2021-06-19 | Discharge: 2021-06-19 | Disposition: A | Discharge status: Home / Self Care | Payer: 59 | Attending: Emergency Medicine | Admitting: Emergency Medicine

## 2021-06-19 ENCOUNTER — Encounter (HOSPITAL_COMMUNITY): Payer: Self-pay | Admitting: *Deleted

## 2021-06-19 ENCOUNTER — Emergency Department (HOSPITAL_COMMUNITY): Payer: 59

## 2021-06-19 DIAGNOSIS — R0789 Other chest pain: Secondary | ICD-10-CM | POA: Diagnosis not present

## 2021-06-19 DIAGNOSIS — R0602 Shortness of breath: Secondary | ICD-10-CM | POA: Insufficient documentation

## 2021-06-19 DIAGNOSIS — R079 Chest pain, unspecified: Secondary | ICD-10-CM | POA: Diagnosis present

## 2021-06-19 LAB — CBC
HCT: 39.1 % (ref 36.0–46.0)
Hemoglobin: 12.7 g/dL (ref 12.0–15.0)
MCH: 26.1 pg (ref 26.0–34.0)
MCHC: 32.5 g/dL (ref 30.0–36.0)
MCV: 80.5 fL (ref 80.0–100.0)
Platelets: 382 10*3/uL (ref 150–400)
RBC: 4.86 MIL/uL (ref 3.87–5.11)
RDW: 13.9 % (ref 11.5–15.5)
WBC: 10.6 10*3/uL — ABNORMAL HIGH (ref 4.0–10.5)
nRBC: 0 % (ref 0.0–0.2)

## 2021-06-19 LAB — D-DIMER, QUANTITATIVE: D-Dimer, Quant: 0.31 ug/mL-FEU (ref 0.00–0.50)

## 2021-06-19 LAB — BASIC METABOLIC PANEL
Anion gap: 9 (ref 5–15)
BUN: 12 mg/dL (ref 6–20)
CO2: 25 mmol/L (ref 22–32)
Calcium: 9.3 mg/dL (ref 8.9–10.3)
Chloride: 102 mmol/L (ref 98–111)
Creatinine, Ser: 0.83 mg/dL (ref 0.44–1.00)
GFR, Estimated: >60 mL/min (ref >60)
Glucose, Bld: 84 mg/dL (ref 70–99)
Potassium: 3.7 mmol/L (ref 3.5–5.1)
Sodium: 136 mmol/L (ref 135–145)

## 2021-06-19 LAB — TROPONIN I (HIGH SENSITIVITY)
Troponin I (High Sensitivity): <2 ng/L (ref <18)
Troponin I (High Sensitivity): <2 ng/L (ref <18)

## 2021-06-19 NOTE — Discharge Instructions (Signed)
Your lab tests tonight are normal as discussed.  I recommend taking Tylenol for any persistent discomfort and follow-up with your primary MD.   ?

## 2021-06-19 NOTE — ED Triage Notes (Signed)
Pt c/o chest pain and mid upper back pain x 4 days; pt c/o sob ?

## 2021-06-20 ENCOUNTER — Telehealth: Payer: Self-pay

## 2021-06-20 NOTE — Telephone Encounter (Signed)
Transition Care Management Unsuccessful Follow-up Telephone Call ? ?Date of discharge and from where:  06/19/2021-[place] ? ?Attempts:  1st Attempt ? ?Reason for unsuccessful TCM follow-up call:  Left voice message ? ?  ?

## 2021-06-21 NOTE — Telephone Encounter (Signed)
Transition Care Management Unsuccessful Follow-up Telephone Call ? ?Date of discharge and from where:  06/19/2021-[place]  ? ?Attempts:  2nd Attempt ? ?Reason for unsuccessful TCM follow-up call:  Left voice message ? ?  ?

## 2021-06-21 NOTE — ED Provider Notes (Signed)
?[facility] ?Provider Note ? ? ?CSN: [id number] ?Arrival date & time: 06/19/21  1633 ? ?  ? ?History ? ?Chief Complaint  ?Patient presents with  ? Chest Pain  ? ? ?Anna Andrews is a 26 y.o. female with history significant for GERD and history of menorrhagia, currently on OCPs for control of this symptom, presenting for evaluation of a 1 week history of midsternal chest pain which radiates into her back.  She also describes shortness of breath which is intermittent but is currently resolved.  She has found no triggers for the shortness of breath, denies orthopnea, not worse with exertion but is intermittent and can occur at rest.  She describes a sharp pain in her mid chest which is constant.  She has had no fevers or chills, no cough, palpitations dizziness, nausea or vomiting. ? ? ? ?The history is provided by the patient.  ? ?  ? ?Home Medications ?Prior to Admission medications   ?Medication Sig Start Date End Date Taking? Authorizing Provider  ?AZO-CRANBERRY PO Take by mouth.    [provider]  ?Drospirenone (SLYND) 4 MG TABS Take 1 daily 05/29/21   Derrek Monaco A, NP  ?EPINEPHrine 0.3 mg/0.3 mL IJ SOAJ injection Inject 0.3 mg into the muscle as needed for anaphylaxis. 03/11/21   Mesner, Corene Cornea, MD  ?hydrocortisone cream 1 % Apply 1 application. topically 2 (two) times daily as needed for itching (For eczema.).    [provider]  ?pantoprazole (PROTONIX) 20 MG tablet Take 1 tablet (20 mg total) by mouth daily. Take 1 pill twice a day for 2 weeks, then 1 pill every morning. 05/30/21   Jeanie Sewer, NP  ?sertraline (ZOLOFT) 50 MG tablet Take 1 tablet (50 mg total) by mouth daily. 06/10/21   Estill Dooms, NP  ?enalapril (VASOTEC) 5 MG tablet Take 1 tablet (5 mg total) by mouth daily. ?Patient not taking: Reported on 06/02/2019 05/05/19 11/02/19  Christin Fudge, CNM  ?   ? ?Allergies    ?Pineapple   ? ?Review of Systems   ?Review of Systems   ?Constitutional:  Negative for chills and fever.  ?HENT:  Negative for congestion and sore throat.   ?Eyes: Negative.   ?Respiratory:  Positive for shortness of breath. Negative for chest tightness.   ?Cardiovascular:  Positive for chest pain. Negative for palpitations and leg swelling.  ?Gastrointestinal:  Negative for abdominal pain, nausea and vomiting.  ?Genitourinary: Negative.   ?Musculoskeletal:  Negative for arthralgias, joint swelling and neck pain.  ?Skin: Negative.  Negative for color change, rash and wound.  ?Neurological:  Negative for dizziness, weakness, light-headedness, numbness and headaches.  ?Psychiatric/Behavioral: Negative.    ?All other systems reviewed and are negative. ? ?Physical Exam ?Updated Vital Signs ?BP (!) 132/97 (BP Location: Right Arm)   Pulse 76   Temp 98 ?F (36.7 ?C) (Oral)   Resp 16   Ht 5\' 8"  (1.727 m)   Wt 106 kg   SpO2 100%   BMI 35.53 kg/m?  ?Physical Exam ?Vitals and nursing note reviewed.  ?Constitutional:   ?   Appearance: She is well-developed.  ?HENT:  ?   Head: Normocephalic and atraumatic.  ?Eyes:  ?   Conjunctiva/sclera: Conjunctivae normal.  ?Cardiovascular:  ?   Rate and Rhythm: Normal rate and regular rhythm.  ?   Heart sounds: Normal heart sounds.  ?Pulmonary:  ?   Effort: Pulmonary effort is normal.  ?   Breath sounds: Normal breath sounds. No  wheezing or rhonchi.  ?Abdominal:  ?   General: Bowel sounds are normal.  ?   Palpations: Abdomen is soft.  ?   Tenderness: There is no abdominal tenderness.  ?Musculoskeletal:     ?   General: Normal range of motion.  ?   Cervical back: Normal range of motion.  ?   Right lower leg: No tenderness. No edema.  ?   Left lower leg: No tenderness. No edema.  ?   Comments: No palpable cords, erythema, no distal peripheral edema.  Pedal pulses are intact.  ?Skin: ?   General: Skin is warm and dry.  ?Neurological:  ?   General: No focal deficit present.  ?   Mental Status: She is alert.  ? ? ?ED Results / Procedures /  Treatments   ?Labs ?(all labs ordered are listed, but only abnormal results are displayed) ?Labs Reviewed  ?CBC - Abnormal; Notable for the following components:  ?    Result Value  ? WBC 10.6 (*)   ? All other components within normal limits  ?BASIC METABOLIC PANEL  ?D-DIMER, QUANTITATIVE  ?TROPONIN I (HIGH SENSITIVITY)  ?TROPONIN I (HIGH SENSITIVITY)  ? ? ?EKG ?EKG Interpretation ? ?Date/Time:  Wednesday June 19 2021 16:41:27 EDT ?Ventricular Rate:  90 ?PR Interval:  158 ?QRS Duration: 86 ?QT Interval:  358 ?QTC Calculation: 437 ?R Axis:   67 ?Text Interpretation: Normal sinus rhythm Normal ECG When compared with ECG of 17-Apr-2021 13:30, No significant change was found Confirmed by Octaviano Glow [phone]) on 06/20/2021 9:22:38 AM ? ?Radiology ? ?DG Chest 2 View ? ?Result Date: 06/19/2021 ?CLINICAL DATA:  Chest pain and mid upper back pain for 4 days. Shortness of breath. EXAM: CHEST - 2 VIEW COMPARISON:  AP chest 04/07/2021 FINDINGS: Cardiac silhouette and mediastinal contours are within normal limits. The lungs are clear. No pleural effusion or pneumothorax. No acute skeletal abnormality. IMPRESSION: No active cardiopulmonary disease. Electronically Signed   By: Yvonne Kendall M.D.   On: 06/19/2021 17:08  ? ? ? ?Procedures ?Procedures  ? ? ?Medications Ordered in ED ?Medications - No data to display ? ?ED Course/ Medical Decision Making/ A&P ?  ?                        ?Medical Decision Making ?Patient with midsternal chest pain which radiates into her back, constant x1 week shortness of breath which is intermittent and not reproducible with activity.  Her lab tests are reassuring, her exam is unremarkable.  She was not PERC negative as she is on OCPs, therefore a D-dimer was added to her work-up which is negative.  PE or DVT is unlikely.  She also has low risk for coronary disease, heart score of 1. ? ?Amount and/or Complexity of Data Reviewed ?Labs: ordered. ?   Details: Labs reviewed and are stable. ?Radiology:  ordered and independent interpretation performed. ?   Details: Chest x-ray is negative for cardiopulmonary abnormalities. ? ?Risk ?OTC drugs. ? ? ? ? ? ? ? ? ? ? ?Final Clinical Impression(s) / ED Diagnoses ?Final diagnoses:  ?Atypical chest pain  ? ? ?Rx / DC Orders ?ED Discharge Orders   ? ? None  ? ?  ? ? ?  ?Evalee Jefferson, PA-C ?06/21/21 2024 ? ?  ?Milton Ferguson, MD ?06/25/21 1038 ? ?

## 2021-06-24 NOTE — Telephone Encounter (Signed)
Transition Care Management Unsuccessful Follow-up Telephone Call ? ?Date of discharge and from where:  06/19/2021-[place]  ? ?Attempts:  3rd Attempt ? ?Reason for unsuccessful TCM follow-up call:  Unable to reach patient ? ? ? ?

## 2021-07-08 DIAGNOSIS — Z419 Encounter for procedure for purposes other than remedying health state, unspecified: Secondary | ICD-10-CM | POA: Diagnosis not present

## 2021-07-22 ENCOUNTER — Telehealth (INDEPENDENT_AMBULATORY_CARE_PROVIDER_SITE_OTHER): Payer: 59 | Admitting: Family

## 2021-07-22 ENCOUNTER — Encounter: Payer: Self-pay | Admitting: Family

## 2021-07-22 VITALS — Ht 68.0 in | Wt 233.0 lb

## 2021-07-22 DIAGNOSIS — J0110 Acute frontal sinusitis, unspecified: Secondary | ICD-10-CM

## 2021-07-22 HISTORY — DX: Acute frontal sinusitis, unspecified: J01.10

## 2021-07-22 MED ORDER — AMOXICILLIN-POT CLAVULANATE 875-125 MG PO TABS: 1.0000 | ORAL_TABLET | Freq: Two times a day (BID) | ORAL | 0 refills | Status: DC

## 2021-07-22 NOTE — Progress Notes (Signed)
? ? ?MyChart Video Visit ? ? ? ?Virtual Visit via Video Note  ? ?This visit type was conducted due to national recommendations for restrictions regarding the COVID-19 Pandemic (e.g. social distancing) in an effort to limit this patient's exposure and mitigate transmission in our community. This patient is at least at moderate risk for complications without adequate follow up. This format is felt to be most appropriate for this patient at this time. Physical exam was limited by quality of the video and audio technology used for the visit. CMA was able to get the patient set up on a video visit. ? ?Patient location: Home. Patient and provider in visit ?Provider location: Office ? ?I discussed the limitations of evaluation and management by telemedicine and the availability of in person appointments. The patient expressed understanding and agreed to proceed. ? ?Visit Date: 07/22/2021 ? ?Today's healthcare provider: Dulce Sellar, NP  ? ? ? ?Subjective:  ? ? Patient ID: Anna Andrews, female    DOB: October 20, 1995, 26 y.o.   MRN: [id number] ? ?Chief Complaint  ?Patient presents with  ? Sinus Problem  ?  Pt c/o nasal congestion, headache and dry coughing. Present for 3 weeks. Has tried robitussin, tylenol which did help a little.   ? ? ?HPI ?Sinusitis: Patient complains of congestion, nasal congestion, post nasal drip, purulent nasal discharge, and sinus pressure, with no fever, chills, night sweats or weight loss. Onset of symptoms was 3 weeks ago, unchanged since that time. She is drinking moderate amounts of fluids.  Past history is significant for no history of pneumonia or bronchitis. Patient is a former smoker.  ? ?Assessment & Plan:  ? ?Problem List Items Addressed This Visit   ? ?  ? Respiratory  ? Acute non-recurrent frontal sinusitis - Primary  ? Sending Augmentin, advised on use & SE, drink 2L water qd, continue sinus medication, can take 1,000mg  Tylenol or 600mg  Ibuprofen tid for HA, fever. ? ?Relevant  Medications  ? amoxicillin-clavulanate (AUGMENTIN) 875-125 MG tablet  ? ? ?Past Medical History:  ?Diagnosis Date  ? Anxiety   ? Eczema   ? Gestational hypertension 04/27/2019  ? Supervision of normal first pregnancy 10/19/2018  ?   FAMILY TREE  LAB RESULTS  Language English Pap 08/20/16 neg  Initiated care at [facility] GC/CT Initial: neg/neg        36wks: neg/neg  Dating by LMP c/w 9wk U/S    Support person [facility] NT/IT/AFP/MaterniT21:declined    [place]/HgbE neg  Flu vaccine 12/22/18  CF declined  TDaP vaccine 02/16/19  SMA declined  Rhogam N/A Fragile X declined       Anatomy 14/09/20 Normal female Blood Type A/Positive/-- (08/11   ? ? ?Past Surgical History:  ?Procedure Laterality Date  ? NO PAST SURGERIES    ? ? ?Outpatient Medications Prior to Visit  ?Medication Sig Dispense Refill  ? EPINEPHrine 0.3 mg/0.3 mL IJ SOAJ injection Inject 0.3 mg into the muscle as needed for anaphylaxis. 2 each 1  ? hydrocortisone cream 1 % Apply 1 application. topically 2 (two) times daily as needed for itching (For eczema.).    ? pantoprazole (PROTONIX) 20 MG tablet Take 1 tablet (20 mg total) by mouth daily. Take 1 pill twice a day for 2 weeks, then 1 pill every morning. 45 tablet 0  ? sertraline (ZOLOFT) 50 MG tablet Take 1 tablet (50 mg total) by mouth daily. 30 tablet 6  ? AZO-CRANBERRY PO Take by mouth.    ? Drospirenone (SLYND)  4 MG TABS Take 1 daily 112 tablet 0  ? ?No facility-administered medications prior to visit.  ? ? ?Allergies  ?Allergen Reactions  ? Pineapple Swelling  ? ? ?   ?Objective:  ?  ? ?Physical Exam ?Vitals and nursing note reviewed.  ?Constitutional:   ?   General: She is not in acute distress. ?   Appearance: Normal appearance.  ?HENT:  ?   Head: Normocephalic.  ?Pulmonary:  ?   Effort: No respiratory distress.  ?Musculoskeletal:  ?   Cervical back: Normal range of motion.  ?Skin: ?   General: Skin is dry.  ?   Coloration: Skin is not pale.  ?Neurological:  ?   Mental Status: She is alert and oriented to person,  place, and time.  ?Psychiatric:     ?   Mood and Affect: Mood normal.  ? ?Ht 5\' 8"  (1.727 m)   Wt 233 lb (105.7 kg)   LMP 07/21/2021 (Exact Date)   BMI 35.43 kg/m?  ? ?Wt Readings from Last 3 Encounters:  ?07/22/21 233 lb (105.7 kg)  ?06/19/21 233 lb 11 oz (106 kg)  ?05/30/21 235 lb 6 oz (106.8 kg)  ? ? ?I discussed the assessment and treatment plan with the patient. The patient was provided an opportunity to ask questions and all were answered. The patient agreed with the plan and demonstrated an understanding of the instructions. ?  ?The patient was advised to call back or seek an in-person evaluation if the symptoms worsen or if the condition fails to improve as anticipated. ? ?I provided 22 minutes of face-to-face time during this encounter. ? ?06/01/21, NP ?[facility] ?[phone] (phone) ?[phone] (fax) ? ?[facility]  ?

## 2021-08-08 DIAGNOSIS — Z419 Encounter for procedure for purposes other than remedying health state, unspecified: Secondary | ICD-10-CM | POA: Diagnosis not present

## 2021-09-07 DIAGNOSIS — Z419 Encounter for procedure for purposes other than remedying health state, unspecified: Secondary | ICD-10-CM | POA: Diagnosis not present

## 2021-09-18 ENCOUNTER — Ambulatory Visit (INDEPENDENT_AMBULATORY_CARE_PROVIDER_SITE_OTHER): Payer: BLUE CROSS/BLUE SHIELD | Admitting: Family

## 2021-09-18 ENCOUNTER — Encounter: Payer: Self-pay | Admitting: Family

## 2021-09-18 VITALS — BP 124/88 | HR 66 | Temp 98.6°F | Ht 68.0 in | Wt 240.2 lb

## 2021-09-18 DIAGNOSIS — Z13220 Encounter for screening for lipoid disorders: Secondary | ICD-10-CM

## 2021-09-18 DIAGNOSIS — M79605 Pain in left leg: Secondary | ICD-10-CM

## 2021-09-18 LAB — LIPID PANEL
Cholesterol: 154 mg/dL (ref 0–200)
HDL: 37.9 mg/dL — ABNORMAL LOW (ref >39.00)
LDL Cholesterol: 88 mg/dL (ref 0–99)
NonHDL: 116.18
Total CHOL/HDL Ratio: 4
Triglycerides: 141 mg/dL (ref 0.0–149.0)
VLDL: 28.2 mg/dL (ref 0.0–40.0)

## 2021-09-18 NOTE — Progress Notes (Signed)
Patient ID: Anna Andrews, female    DOB: [birth year], 26 y.o.   MRN: [id number]  Chief Complaint  Patient presents with   Leg Pain    Pt c/o left leg pain for about 3-4 weeks. Pt states she stopped her Birth control about a month ago and left leg been hurting since. Pain feels like a cram in left calf. Has tried tylenol which did not help much.     HPI: Pain: She reports recurrent leg pain. There was not an injury that may have caused the pain. The pain started a few months ago and is intermittent. The pain does not radiate. The pain is described as aching, soreness, and stiffness, is moderate in intensity, occurring intermittently. Symptoms are worse in the: evening, nighttime. Aggravating factors: standing long periods. She has tried NSAIDs with little relief. Reports pain behind the knee and radiates down to her calf. States she stopped her OCP after 1.80mos due to heavy cycles w/increased pain, wants to get an IUD.   Assessment & Plan:  1. Left leg pain pt concerned as she was on OCP, still smokes, is overweight, and having leg pain. Denies any erythema or swelling, or warmth. Reports stopping the OCP a few weeks ago, due to heavy cycles. She reports not wearing the compression socks at her new job due to not part of dress code with wearing shorter pants.   - VAS [place] LOWER EXTREMITY VENOUS (DVT)  2. Need for lipid screening has not had done in past, reports she is fasting.  - Lipid panel    Subjective:    Outpatient Medications Prior to Visit  Medication Sig Dispense Refill   EPINEPHrine 0.3 mg/0.3 mL IJ SOAJ injection Inject 0.3 mg into the muscle as needed for anaphylaxis. 2 each 1   sertraline (ZOLOFT) 50 MG tablet Take 1 tablet (50 mg total) by mouth daily. 30 tablet 6   amoxicillin-clavulanate (AUGMENTIN) 875-125 MG tablet Take 1 tablet by mouth 2 (two) times daily after a meal. 10 tablet 0   hydrocortisone cream 1 % Apply 1 application. topically 2 (two) times daily as  needed for itching (For eczema.).     pantoprazole (PROTONIX) 20 MG tablet Take 1 tablet (20 mg total) by mouth daily. Take 1 pill twice a day for 2 weeks, then 1 pill every morning. (Patient not taking: Reported on 09/18/2021) 45 tablet 0   No facility-administered medications prior to visit.   Past Medical History:  Diagnosis Date   Anxiety    Eczema    Gestational hypertension 04/27/2019   Supervision of normal first pregnancy 10/19/2018     FAMILY TREE  LAB RESULTS  Language English Pap 08/20/16 neg  Initiated care at [facility] GC/CT Initial: neg/neg        36wks: neg/neg  Dating by LMP c/w 9wk U/S    Support person Cori Razor NT/IT/AFP/MaterniT21:declined    [place]/HgbE neg  Flu vaccine 12/22/18  CF declined  TDaP vaccine 02/16/19  SMA declined  Rhogam N/A Fragile X declined       Anatomy US Normal female Blood Type A/Positive/-- (08/11    Past Surgical History:  Procedure Laterality Date   NO PAST SURGERIES     Allergies  Allergen Reactions   Pineapple Swelling      Objective:    Physical Exam Vitals and nursing note reviewed.  Constitutional:      Appearance: Normal appearance. She is obese.  Cardiovascular:     Rate and Rhythm: Normal  rate and regular rhythm.  Pulmonary:     Effort: Pulmonary effort is normal.     Breath sounds: Normal breath sounds.  Musculoskeletal:        General: Normal range of motion.     Right lower leg: No edema.     Left lower leg: No edema.  Skin:    General: Skin is warm and dry.  Neurological:     Mental Status: She is alert.  Psychiatric:        Mood and Affect: Mood normal.        Behavior: Behavior normal.    BP 124/88 (BP Location: Left Arm, Patient Position: Sitting, Cuff Size: Large)   Pulse 66   Temp 98.6 F (37 C) (Temporal)   Ht 5\' 8"  (1.727 m)   Wt 240 lb 4 oz (109 kg)   LMP 08/24/2021 (Approximate)   SpO2 98%   BMI 36.53 kg/m  Wt Readings from Last 3 Encounters:  09/18/21 240 lb 4 oz (109 kg)  07/22/21 233 lb (105.7 kg)   06/19/21 233 lb 11 oz (106 kg)       08/19/21, NP

## 2021-09-19 NOTE — Progress Notes (Signed)
Hi Lucianna,  Your cholesterol labs are all normal! Just your HDL (good #) is a little low. This can be increased with eating more fatty fish, flax, hemp, or chia seeds, and/or exercising.  Take care!

## 2021-09-25 ENCOUNTER — Ambulatory Visit (HOSPITAL_COMMUNITY)
Admission: RE | Admit: 2021-09-25 | Discharge: 2021-09-25 | Disposition: A | Discharge status: Home / Self Care | Payer: BLUE CROSS/BLUE SHIELD | Source: Ambulatory Visit | Attending: Family | Admitting: Family

## 2021-09-25 DIAGNOSIS — M79605 Pain in left leg: Secondary | ICD-10-CM | POA: Insufficient documentation

## 2021-09-25 NOTE — Progress Notes (Signed)
Good news- Ultrasound and vascular testing is all negative. No DVT or vascular insufficiency causing her pain.

## 2021-09-27 ENCOUNTER — Ambulatory Visit
Admission: RE | Admit: 2021-09-27 | Discharge: 2021-09-27 | Disposition: A | Discharge status: Home / Self Care | Payer: BLUE CROSS/BLUE SHIELD | Source: Ambulatory Visit

## 2021-09-27 VITALS — BP 125/83 | HR 100 | Temp 98.6°F | Resp 17

## 2021-09-27 DIAGNOSIS — H6983 Other specified disorders of Eustachian tube, bilateral: Secondary | ICD-10-CM | POA: Diagnosis not present

## 2021-09-27 MED ORDER — CETIRIZINE-PSEUDOEPHEDRINE ER 5-120 MG PO TB12: 1.0000 | ORAL_TABLET | Freq: Two times a day (BID) | ORAL | 0 refills | Status: DC

## 2021-09-27 MED ORDER — FLUTICASONE PROPIONATE 50 MCG/ACT NA SUSP: 2.0000 | Freq: Every day | NASAL | 0 refills | Status: DC

## 2021-09-27 NOTE — Discharge Instructions (Signed)
As discussed, it appears that you have fluid behind the middle ear.  This is consistent with the symptoms you are currently experiencing. Take medication as prescribed. May take over-the-counter Tylenol or ibuprofen as needed for pain or discomfort. Warm compresses to the ears to help with pain or discomfort. Do not stick or insert anything inside of the ear while symptoms persist. If your symptoms do not improve within the next 10 to 14 days, please follow-up in our clinic or with your primary care physician.

## 2021-09-27 NOTE — ED Provider Notes (Signed)
[facility]    CSN: [id number] Arrival date & time: 09/27/21  1243      History   Chief Complaint Chief Complaint  Patient presents with   Ear Fullness    Head and ear pain after pool, left size under arm pit swollen. My son was seen and had a staph infection want to be seen and check out - Entered by patient   Otalgia    HPI Anna Andrews is a 26 y.o. female.   The history is provided by the patient.   Patient presents for left ear pain, facial pressure, and nasal congestion has been present for the past 3 days.  Patient states that she went swimming and went underwater, and symptoms have been present since that time.  She also complains of muffled hearing, and feeling like she is "underwater".  She denies fever, chills, ear drainage, runny nose, cough, or GI symptoms.  She has been taking Alka-Seltzer and Tylenol for her symptoms which gives her some relief.  Past Medical History:  Diagnosis Date   Anxiety    Eczema    Gestational hypertension 04/27/2019   Supervision of normal first pregnancy 10/19/2018     FAMILY TREE  LAB RESULTS  Language English Pap 08/20/16 neg  Initiated care at [facility] GC/CT Initial: neg/neg        36wks: neg/neg  Dating by LMP c/w 9wk U/S    Support person [facility] NT/IT/AFP/MaterniT21:declined    [place]/HgbE neg  Flu vaccine 12/22/18  CF declined  TDaP vaccine 02/16/19  SMA declined  Rhogam N/A Fragile X declined       Anatomy US Normal female Blood Type A/Positive/-- (08/11     Patient Active Problem List   Diagnosis Date Noted   Acute non-recurrent frontal sinusitis 07/22/2021   Gastroesophageal reflux disease with esophagitis without hemorrhage 05/30/2021   Menorrhagia with regular cycle 05/29/2021   Encounter for menstrual regulation 05/29/2021   Pelvic pain 05/14/2021   Acute right-sided low back pain 05/14/2021   Symptoms of urinary tract infection 05/14/2021   Late period 05/14/2021   Right calf pain 05/14/2021   Dysmenorrhea  05/14/2021   Dyspareunia in female 05/14/2021   Strep sore throat 04/10/2021   Influenza 02/14/2021   Annual physical exam 01/25/2021   Pain and swelling of right lower leg 01/25/2021   Obesity (BMI 30-39.9) 01/25/2021   Anxiety 10/19/2018    Past Surgical History:  Procedure Laterality Date   NO PAST SURGERIES      OB History     Gravida  1   Para  1   Term  1   Preterm  0   AB  0   Living  1      SAB  0   IAB  0   Ectopic  0   Multiple  0   Live Births  1            Home Medications    Prior to Admission medications   Medication Sig Start Date End Date Taking? Authorizing Provider  acetaminophen (TYLENOL) 500 MG tablet Take 500 mg by mouth every 6 (six) hours as needed.   Yes [provider]  cetirizine-pseudoephedrine (ZYRTEC-D) 5-120 MG tablet Take 1 tablet by mouth 2 (two) times daily. 09/27/21 10/27/21 Yes Leone Mobley-Warren, Sadie Haber, NP  fluticasone (FLONASE) 50 MCG/ACT nasal spray Place 2 sprays into both nostrils daily. 09/27/21  Yes Jancie Kercher-Warren, Sadie Haber, NP  Phenyleph-Doxylamine-DM-APAP (ALKA SELTZER PLUS PO) Take by  mouth.   Yes [provider]  EPINEPHrine 0.3 mg/0.3 mL IJ SOAJ injection Inject 0.3 mg into the muscle as needed for anaphylaxis. 03/11/21   Mesner, Barbara Cower, MD  pantoprazole (PROTONIX) 20 MG tablet Take 1 tablet (20 mg total) by mouth daily. Take 1 pill twice a day for 2 weeks, then 1 pill every morning. Patient not taking: Reported on 09/18/2021 05/30/21   Dulce Sellar, NP  sertraline (ZOLOFT) 50 MG tablet Take 1 tablet (50 mg total) by mouth daily. 06/10/21   Adline Potter, NP  enalapril (VASOTEC) 5 MG tablet Take 1 tablet (5 mg total) by mouth daily. Patient not taking: Reported on 06/02/2019 05/05/19 11/02/19  Jacklyn Shell, CNM    Family History Family History  Problem Relation Age of Onset   Hypertension Paternal Grandfather    CAD Paternal Grandmother    Hypertension Mother     Social  History Social History   Tobacco Use   Smoking status: Former    Packs/day: 0.50    Types: Cigarettes   Smokeless tobacco: Never   Tobacco comments:    occ  Vaping Use   Vaping Use: Never used  Substance Use Topics   Alcohol use: No   Drug use: No     Allergies   Pineapple   Review of Systems Review of Systems Per HPI  Physical Exam Triage Vital Signs ED Triage Vitals  Enc Vitals Group     BP 09/27/21 1326 125/83     Pulse Rate 09/27/21 1326 100     Resp 09/27/21 1326 17     Temp 09/27/21 1326 98.6 F (37 C)     Temp Source 09/27/21 1326 Oral     SpO2 09/27/21 1326 98 %     Weight --      Height --      Head Circumference --      Peak Flow --      Pain Score 09/27/21 1323 9     Pain Loc --      Pain Edu? --      Excl. in GC? --    No data found.  Updated Vital Signs BP 125/83 (BP Location: Right Arm)   Pulse 100   Temp 98.6 F (37 C) (Oral)   Resp 17   LMP 09/24/2021 (Exact Date)   SpO2 98%   Visual Acuity Right Eye Distance:   Left Eye Distance:   Bilateral Distance:    Right Eye Near:   Left Eye Near:    Bilateral Near:     Physical Exam Vitals and nursing note reviewed.  Constitutional:      General: She is not in acute distress.    Appearance: Normal appearance. She is well-developed.  HENT:     Head: Normocephalic.     Right Ear: Hearing, ear canal and external ear normal. A middle ear effusion is present. Tympanic membrane is not erythematous or bulging.     Left Ear: Hearing, ear canal and external ear normal. A middle ear effusion is present. Tympanic membrane is not erythematous or bulging.     Nose: Congestion present.     Right Turbinates: Enlarged and swollen.     Left Turbinates: Enlarged and swollen.     Right Sinus: No maxillary sinus tenderness or frontal sinus tenderness.     Left Sinus: No maxillary sinus tenderness or frontal sinus tenderness.     Mouth/Throat:     Lips: Pink.     Mouth: Mucous  membranes are moist.      Pharynx: Oropharynx is clear. Uvula midline.     Tonsils: No tonsillar exudate.  Eyes:     Extraocular Movements: Extraocular movements intact.     Conjunctiva/sclera: Conjunctivae normal.     Pupils: Pupils are equal, round, and reactive to light.  Cardiovascular:     Rate and Rhythm: Normal rate and regular rhythm.     Heart sounds: Normal heart sounds.  Pulmonary:     Effort: Pulmonary effort is normal.     Breath sounds: Normal breath sounds.  Abdominal:     General: Bowel sounds are normal. There is no distension.     Palpations: Abdomen is soft.     Tenderness: There is no abdominal tenderness. There is no guarding or rebound.  Genitourinary:    Vagina: Normal. No vaginal discharge.  Skin:    General: Skin is warm and dry.     Findings: No erythema or rash.  Neurological:     General: No focal deficit present.     Mental Status: She is alert and oriented to person, place, and time.     Cranial Nerves: No cranial nerve deficit.  Psychiatric:        Mood and Affect: Mood normal.        Behavior: Behavior normal.      UC Treatments / Results  Labs (all labs ordered are listed, but only abnormal results are displayed) Labs Reviewed - No data to display  EKG   Radiology  Procedures Procedures (including critical care time)  Medications Ordered in UC Medications - No data to display  Initial Impression / Assessment and Plan / UC Course  I have reviewed the triage vital signs and the nursing notes.  Pertinent labs & imaging results that were available during my care of the patient were reviewed by me and considered in my medical decision making (see chart for details).  Patient presents with left ear pain and pressure.  On exam, patient has bilateral middle ear effusions.  Tympanic membranes are without bulging or erythema.  Symptoms are consistent with bilateral eustachian tube dysfunction. Will start patient on Zyrtec-D and fluticasone.  Supportive care  recommendations were provided to the patient.  Patient advised to follow-up within the next 10 to 14 days if symptoms do not improve. Final Clinical Impressions(s) / UC Diagnoses   Final diagnoses:  Acute dysfunction of Eustachian tube, bilateral     Discharge Instructions      As discussed, it appears that you have fluid behind the middle ear.  This is consistent with the symptoms you are currently experiencing. Take medication as prescribed. May take over-the-counter Tylenol or ibuprofen as needed for pain or discomfort. Warm compresses to the ears to help with pain or discomfort. Do not stick or insert anything inside of the ear while symptoms persist. If your symptoms do not improve within the next 10 to 14 days, please follow-up in our clinic or with your primary care physician.     ED Prescriptions     Medication Sig Dispense Auth. Provider   cetirizine-pseudoephedrine (ZYRTEC-D) 5-120 MG tablet Take 1 tablet by mouth 2 (two) times daily. 60 tablet Rami Budhu-Warren, Sadie Haber, NP   fluticasone (FLONASE) 50 MCG/ACT nasal spray Place 2 sprays into both nostrils daily. 16 g Wenzel Backlund-Warren, Sadie Haber, NP      PDMP not reviewed this encounter.   Abran Cantor, NP 09/27/21 1345

## 2021-09-27 NOTE — ED Triage Notes (Signed)
Pt reports left era pain, facial pressure and nasal congestion x 3 days. Alka Seltzer and Tylenol gives some relief.

## 2021-10-03 ENCOUNTER — Encounter: Payer: Self-pay | Admitting: Family

## 2021-10-03 ENCOUNTER — Ambulatory Visit (INDEPENDENT_AMBULATORY_CARE_PROVIDER_SITE_OTHER): Payer: BLUE CROSS/BLUE SHIELD | Admitting: Family

## 2021-10-03 VITALS — BP 117/81 | HR 83 | Temp 98.6°F | Ht 68.0 in | Wt 239.1 lb

## 2021-10-03 DIAGNOSIS — B9789 Other viral agents as the cause of diseases classified elsewhere: Secondary | ICD-10-CM

## 2021-10-03 DIAGNOSIS — J329 Chronic sinusitis, unspecified: Secondary | ICD-10-CM | POA: Diagnosis not present

## 2021-10-03 DIAGNOSIS — H9209 Otalgia, unspecified ear: Secondary | ICD-10-CM | POA: Diagnosis not present

## 2021-10-03 MED ORDER — NOREL AD 4-10-325 MG PO TABS: 1.0000 | ORAL_TABLET | Freq: Every day | ORAL | 0 refills | Status: DC

## 2021-10-03 NOTE — Progress Notes (Signed)
Patient ID: Anna Andrews, female    DOB: 17-Dec-1995, 26 y.o.   MRN: [id number]  Chief Complaint  Patient presents with   Ear Fullness    Pt c/o bilateral ear fullness and left ear is the most painful.     HPI: Ear pain - pt describes a 7 day history of Bilateral ear pain with fullness, pressure. Denies associated discharge, itching, fever or dizziness. Seen in UC for same problem a week ago, told to take Zyrtec and Flonase, but she did not take anything. Reports left side of face also tender, feels full. Reports nasal congestion & drainage, denies tooth pain or headache.   Assessment & Plan:  1. Otalgia, unspecified laterality Left > right. Ear canals and bilateral TM are wnl. No lymphedema noted. Advised to restart her Flonase nasal spray qam, OK to take up to 600mg  Ibuprofen tid after meals for any ear pain.  2. Viral sinusitis needs to resume Flonase qd for at least 1-2 weeks, given samples of Norel AD to take in afternoon for next few days, advised on use & SE. Increase water intake to 2 liters qd.  - Chlorphen-PE-Acetaminophen (NOREL AD) 4-10-325 MG TABS; Take 1 tablet by mouth daily after lunch.  Dispense: 4 tablet; Refill: 0   Subjective:    Outpatient Medications Prior to Visit  Medication Sig Dispense Refill   acetaminophen (TYLENOL) 500 MG tablet Take 500 mg by mouth every 6 (six) hours as needed.     cetirizine-pseudoephedrine (ZYRTEC-D) 5-120 MG tablet Take 1 tablet by mouth 2 (two) times daily. 60 tablet 0   EPINEPHrine 0.3 mg/0.3 mL IJ SOAJ injection Inject 0.3 mg into the muscle as needed for anaphylaxis. 2 each 1   fluticasone (FLONASE) 50 MCG/ACT nasal spray Place 2 sprays into both nostrils daily. 16 g 0   pantoprazole (PROTONIX) 20 MG tablet Take 1 tablet (20 mg total) by mouth daily. Take 1 pill twice a day for 2 weeks, then 1 pill every morning. 45 tablet 0   Phenyleph-Doxylamine-DM-APAP (ALKA SELTZER PLUS PO) Take by mouth.     sertraline (ZOLOFT) 50 MG tablet  Take 1 tablet (50 mg total) by mouth daily. 30 tablet 6   No facility-administered medications prior to visit.   Past Medical History:  Diagnosis Date   Anxiety    Eczema    Gestational hypertension 04/27/2019   Supervision of normal first pregnancy 10/19/2018     FAMILY TREE  LAB RESULTS  Language English Pap 08/20/16 neg  Init[facility] care at [facility] GC/CT Initial: neg/neg        36wks: neg/neg  Dating by LMP c/w 9wk U/S    Support person [facility] NT/IT/AFP/MaterniT21:declined    [place]/HgbE neg  Flu vaccine 12/22/18  CF declined  TDaP vaccine 02/16/19  SMA declined  Rhogam N/A Fragile X declined       Anatomy 14/09/20 Normal female Blood Type A/Positive/-- (08/11    Past Surgical History:  Procedure Laterality Date   NO PAST SURGERIES     Allergies  Allergen Reactions   Pineapple Swelling      Objective:    Physical Exam Vitals and nursing note reviewed.  Constitutional:      Appearance: Normal appearance.  HENT:     Right Ear: Tympanic membrane and ear canal normal.     Left Ear: Tympanic membrane and ear canal normal.     Nose:     Left Sinus: Frontal sinus tenderness (mild) present.  Mouth/Throat:     Mouth: Mucous membranes are moist.     Pharynx: No pharyngeal swelling, oropharyngeal exudate, posterior oropharyngeal erythema or uvula swelling.  Cardiovascular:     Rate and Rhythm: Normal rate and regular rhythm.  Pulmonary:     Effort: Pulmonary effort is normal.     Breath sounds: Normal breath sounds.  Musculoskeletal:        General: Normal range of motion.  Skin:    General: Skin is warm and dry.  Neurological:     Mental Status: She is alert.  Psychiatric:        Mood and Affect: Mood normal.        Behavior: Behavior normal.    BP 117/81 (BP Location: Left Arm, Patient Position: Sitting, Cuff Size: Large)   Pulse 83   Temp 98.6 F (37 C) (Temporal)   Ht 5\' 8"  (1.727 m)   Wt 239 lb 2 oz (108.5 kg)   LMP 09/24/2021 (Exact Date)   SpO2 99%   BMI 36.36 kg/m   Wt Readings from Last 3 Encounters:  10/03/21 239 lb 2 oz (108.5 kg)  09/18/21 240 lb 4 oz (109 kg)  07/22/21 233 lb (105.7 kg)       07/24/21, NP

## 2021-10-08 DIAGNOSIS — Z419 Encounter for procedure for purposes other than remedying health state, unspecified: Secondary | ICD-10-CM | POA: Diagnosis not present

## 2021-10-09 ENCOUNTER — Encounter: Payer: Self-pay | Admitting: Family

## 2021-10-09 ENCOUNTER — Ambulatory Visit (INDEPENDENT_AMBULATORY_CARE_PROVIDER_SITE_OTHER): Payer: BLUE CROSS/BLUE SHIELD | Admitting: Family

## 2021-10-09 VITALS — BP 109/79 | HR 70 | Temp 98.1°F | Ht 68.0 in | Wt 237.0 lb

## 2021-10-09 DIAGNOSIS — J0110 Acute frontal sinusitis, unspecified: Secondary | ICD-10-CM | POA: Diagnosis not present

## 2021-10-09 DIAGNOSIS — K529 Noninfective gastroenteritis and colitis, unspecified: Secondary | ICD-10-CM | POA: Diagnosis not present

## 2021-10-09 MED ORDER — DOXYCYCLINE HYCLATE 100 MG PO TABS: 100.0000 mg | ORAL_TABLET | Freq: Two times a day (BID) | ORAL | 0 refills | Status: DC

## 2021-10-09 MED ORDER — ONDANSETRON HCL 4 MG PO TABS: 4.0000 mg | ORAL_TABLET | Freq: Three times a day (TID) | ORAL | 0 refills | Status: DC | PRN

## 2021-10-09 NOTE — Progress Notes (Signed)
Patient ID: Anna Andrews, female    DOB: January 29, 1996, 26 y.o.   MRN: [id number]  Chief Complaint  Patient presents with  . Follow-up    Pt c/o her right ear and facial pain getting worst. Pt states she feels like she has a sinus infection. Symptoms include nasal drainage(reddish, green and yellow), fatigue and achy in my face jaw and ear.   . Nausea    Pt states she has been nauseated , having some abdominal pain and diarrhea since last week.  Pt has been taking imodium.     HPI: Ear pain - pt describes a 7 day history of Bilateral ear pain with fullness, pressure. Denies associated discharge, itching, fever or dizziness. Seen in UC for same problem a week ago, told to take Zyrtec and Flonase, but she did not take anything. Reports left side of face also tender, feels full. Reports nasal congestion & drainage, denies tooth pain or headache. Reports nasal mucus, yellow & clear, feeling fatigue, behind eye pain, sinus pressure. Diarrhea with gastritis:  reports starting yesterday, nausea and loose diarrhea turning to water, about 6 episodes yesterday, and today has gone 4 times so far, started Imodium AD, OTC this am, has slowed down slightly. Reports eating salmon and unsure if that caused her sx.  Assessment & Plan:  1. Acute non-recurrent frontal sinusitis sending DOXY d/t current gastritis want to avoid Augmentin. Advised on use & SE, continue to use Flonase and oral antihistamine.  - doxycycline (VIBRA-TABS) 100 MG tablet; Take 1 tablet (100 mg total) by mouth 2 (two) times daily.  Dispense: 10 tablet; Refill: 0  2. Gastroenteritis sending Zofran, advised on use & SE. continue Imodium AD for diarrhea. Advised on importance of hydration and eating BRAT diet.  - ondansetron (ZOFRAN) 4 MG tablet; Take 1 tablet (4 mg total) by mouth every 8 (eight) hours as needed for nausea or vomiting.  Dispense: 20 tablet; Refill: 0   Subjective:    Outpatient Medications Prior to Visit  Medication  Sig Dispense Refill  . acetaminophen (TYLENOL) 500 MG tablet Take 500 mg by mouth every 6 (six) hours as needed.    . cetirizine-pseudoephedrine (ZYRTEC-D) 5-120 MG tablet Take 1 tablet by mouth 2 (two) times daily. 60 tablet 0  . Chlorphen-PE-Acetaminophen (NOREL AD) 4-10-325 MG TABS Take 1 tablet by mouth daily after lunch. 4 tablet 0  . EPINEPHrine 0.3 mg/0.3 mL IJ SOAJ injection Inject 0.3 mg into the muscle as needed for anaphylaxis. 2 each 1  . fluticasone (FLONASE) 50 MCG/ACT nasal spray Place 2 sprays into both nostrils daily. 16 g 0  . pantoprazole (PROTONIX) 20 MG tablet Take 1 tablet (20 mg total) by mouth daily. Take 1 pill twice a day for 2 weeks, then 1 pill every morning. 45 tablet 0  . sertraline (ZOLOFT) 50 MG tablet Take 1 tablet (50 mg total) by mouth daily. 30 tablet 6   No facility-administered medications prior to visit.   Past Medical History:  Diagnosis Date  . Anxiety   . Eczema   . Gestational hypertension 04/27/2019  . Supervision of normal first pregnancy 10/19/2018     FAMILY TREE  LAB RESULTS  Language English Pap 08/20/16 neg  Initiated care at [facility] GC/CT Initial: neg/neg        36wks: neg/neg  Dating by LMP c/w 9wk U/S    Support person Cori Razor NT/IT/AFP/MaterniT21:declined    [place]/HgbE neg  Flu vaccine 12/22/18  CF declined  TDaP vaccine  02/16/19  SMA declined  Rhogam N/A Fragile X declined       Anatomy US Normal female Blood Type A/Positive/-- (08/11    Past Surgical History:  Procedure Laterality Date  . NO PAST SURGERIES     Allergies  Allergen Reactions  . Pineapple Swelling      Objective:    Physical Exam Vitals and nursing note reviewed.  Constitutional:      Appearance: Normal appearance.  HENT:     Right Ear: Tympanic membrane and ear canal normal.     Left Ear: Ear canal normal. A middle ear effusion is present.     Nose:     Right Sinus: Frontal sinus tenderness present.     Left Sinus: Maxillary sinus tenderness and frontal sinus  tenderness present.  Cardiovascular:     Rate and Rhythm: Normal rate and regular rhythm.  Pulmonary:     Effort: Pulmonary effort is normal.     Breath sounds: Normal breath sounds.  Musculoskeletal:        General: Normal range of motion.  Skin:    General: Skin is warm and dry.  Neurological:     Mental Status: She is alert.  Psychiatric:        Mood and Affect: Mood normal.        Behavior: Behavior normal.   BP 109/79 (BP Location: Left Arm, Patient Position: Sitting, Cuff Size: Large)   Pulse 70   Temp 98.1 F (36.7 C) (Temporal)   Ht 5\' 8"  (1.727 m)   Wt 237 lb (107.5 kg)   LMP 09/24/2021 (Exact Date)   SpO2 100%   BMI 36.04 kg/m  Wt Readings from Last 3 Encounters:  10/09/21 237 lb (107.5 kg)  10/03/21 239 lb 2 oz (108.5 kg)  09/18/21 240 lb 4 oz (109 kg)       11/19/21, NP

## 2021-10-19 ENCOUNTER — Other Ambulatory Visit: Payer: Self-pay

## 2021-10-19 ENCOUNTER — Encounter (HOSPITAL_COMMUNITY): Payer: Self-pay | Admitting: *Deleted

## 2021-10-19 ENCOUNTER — Emergency Department (HOSPITAL_COMMUNITY): Payer: BLUE CROSS/BLUE SHIELD

## 2021-10-19 ENCOUNTER — Emergency Department (HOSPITAL_COMMUNITY)
Admission: EM | Admit: 2021-10-19 | Discharge: 2021-10-19 | Disposition: A | Discharge status: Home / Self Care | Payer: BLUE CROSS/BLUE SHIELD | Attending: Emergency Medicine | Admitting: Emergency Medicine

## 2021-10-19 DIAGNOSIS — G43809 Other migraine, not intractable, without status migrainosus: Secondary | ICD-10-CM

## 2021-10-19 DIAGNOSIS — G43909 Migraine, unspecified, not intractable, without status migrainosus: Secondary | ICD-10-CM | POA: Diagnosis not present

## 2021-10-19 DIAGNOSIS — R202 Paresthesia of skin: Secondary | ICD-10-CM | POA: Diagnosis not present

## 2021-10-19 DIAGNOSIS — R519 Headache, unspecified: Secondary | ICD-10-CM | POA: Diagnosis not present

## 2021-10-19 LAB — CBC
HCT: 40.2 % (ref 36.0–46.0)
Hemoglobin: 12.8 g/dL (ref 12.0–15.0)
MCH: 25.7 pg — ABNORMAL LOW (ref 26.0–34.0)
MCHC: 31.8 g/dL (ref 30.0–36.0)
MCV: 80.6 fL (ref 80.0–100.0)
Platelets: 378 10*3/uL (ref 150–400)
RBC: 4.99 MIL/uL (ref 3.87–5.11)
RDW: 14.4 % (ref 11.5–15.5)
WBC: 10.2 10*3/uL (ref 4.0–10.5)
nRBC: 0 % (ref 0.0–0.2)

## 2021-10-19 LAB — BASIC METABOLIC PANEL
Anion gap: 6 (ref 5–15)
BUN: 12 mg/dL (ref 6–20)
CO2: 28 mmol/L (ref 22–32)
Calcium: 9.8 mg/dL (ref 8.9–10.3)
Chloride: 103 mmol/L (ref 98–111)
Creatinine, Ser: 0.67 mg/dL (ref 0.44–1.00)
GFR, Estimated: >60 mL/min (ref >60)
Glucose, Bld: 94 mg/dL (ref 70–99)
Potassium: 4 mmol/L (ref 3.5–5.1)
Sodium: 137 mmol/L (ref 135–145)

## 2021-10-19 LAB — PREGNANCY, URINE: Preg Test, Ur: NEGATIVE

## 2021-10-19 MED ORDER — SODIUM CHLORIDE 0.9 % IV BOLUS (SEPSIS)
1000.0000 mL | Freq: Once | INTRAVENOUS | Status: AC
  Administered 2021-10-19: 1000 mL via INTRAVENOUS

## 2021-10-19 MED ORDER — SODIUM CHLORIDE 0.9 % IV SOLN: 1000.0000 mL | INTRAVENOUS | Status: DC

## 2021-10-19 MED ORDER — PROCHLORPERAZINE EDISYLATE 10 MG/2ML IJ SOLN
10.0000 mg | Freq: Once | INTRAMUSCULAR | Status: AC
  Administered 2021-10-19: 10 mg via INTRAVENOUS
  Filled 2021-10-19: qty 2

## 2021-10-19 MED ORDER — KETOROLAC TROMETHAMINE 30 MG/ML IJ SOLN
30.0000 mg | Freq: Once | INTRAMUSCULAR | Status: AC
  Administered 2021-10-19: 30 mg via INTRAVENOUS
  Filled 2021-10-19: qty 1

## 2021-10-19 NOTE — ED Provider Notes (Signed)
[facility] EMERGENCY DEPARTMENT Provider Note   CSN: [id number] Arrival date & time: 10/19/21  1613     History  Chief Complaint  Patient presents with   Tingling    Anna Andrews is a 26 y.o. female.  HPI Patient has past medical history of eczema, anxiety, gestational hypertension  Patient states she started having some pain and discomfort around her left eye and left hand last night.  She noticed some tingling in her left hand.  Patient was at work today and then she started experiencing some tingling in her left foot.  She had some nausea but no vomiting.  No diarrhea.  No fevers or chills.  No coughing.  She is not having trouble with her speech.  No trouble with her balance or coordination  Home Medications Prior to Admission medications   Medication Sig Start Date End Date Taking? Authorizing Provider  acetaminophen (TYLENOL) 500 MG tablet Take 500 mg by mouth every 6 (six) hours as needed.   Yes [provider]  EPINEPHrine 0.3 mg/0.3 mL IJ SOAJ injection Inject 0.3 mg into the muscle as needed for anaphylaxis. 03/11/21  Yes Mesner, Barbara Cower, MD  pantoprazole (PROTONIX) 20 MG tablet Take 1 tablet (20 mg total) by mouth daily. Take 1 pill twice a day for 2 weeks, then 1 pill every morning. 05/30/21  Yes Hudnell, Judeth Cornfield, NP  sertraline (ZOLOFT) 50 MG tablet Take 1 tablet (50 mg total) by mouth daily. 06/10/21  Yes Cyril Mourning A, NP  enalapril (VASOTEC) 5 MG tablet Take 1 tablet (5 mg total) by mouth daily. Patient not taking: Reported on 06/02/2019 05/05/19 11/02/19  Cresenzo-Dishmon, Scarlette Calico, CNM      Allergies    Pineapple    Review of Systems   Review of Systems  Physical Exam Updated Vital Signs BP 114/79   Pulse 75   Temp 98.2 F (36.8 C) (Oral)   Resp 19   Ht 1.727 m (5\' 8" )   Wt 107.5 kg   LMP 09/24/2021 (Exact Date)   SpO2 99%   BMI 36.04 kg/m  Physical Exam Vitals and nursing note reviewed.  Constitutional:      General: She is not in acute  distress.    Appearance: She is well-developed.  HENT:     Head: Normocephalic and atraumatic.     Right Ear: External ear normal.     Left Ear: External ear normal.  Eyes:     General: No scleral icterus.       Right eye: No discharge.        Left eye: No discharge.     Conjunctiva/sclera: Conjunctivae normal.  Neck:     Trachea: No tracheal deviation.  Cardiovascular:     Rate and Rhythm: Normal rate and regular rhythm.  Pulmonary:     Effort: Pulmonary effort is normal. No respiratory distress.     Breath sounds: Normal breath sounds. No stridor. No wheezing or rales.  Abdominal:     General: Bowel sounds are normal. There is no distension.     Palpations: Abdomen is soft.     Tenderness: There is no abdominal tenderness. There is no guarding or rebound.  Musculoskeletal:        General: No tenderness.     Cervical back: Neck supple.  Skin:    General: Skin is warm and dry.     Findings: No rash.  Neurological:     Mental Status: She is alert and oriented to person, place, and  time.     Cranial Nerves: No cranial nerve deficit.     Sensory: No sensory deficit.     Motor: No abnormal muscle tone or seizure activity.     Coordination: Coordination normal.     Comments: No pronator drift bilateral upper extrem, able to hold both legs off bed for 5 seconds, sensation intact in all extremities, no visual field cuts, no left or right sided neglect, normal finger-nose exam bilaterally, no nystagmus noted  No facial droop, extraocular movements intact, tongue midline     ED Results / Procedures / Treatments   Labs (all labs ordered are listed, but only abnormal results are displayed) Labs Reviewed  CBC - Abnormal; Notable for the following components:      Result Value   MCH 25.7 (*)    All other components within normal limits  BASIC METABOLIC PANEL  PREGNANCY, URINE    EKG EKG Interpretation  Date/Time:  Saturday October 19 2021 17:17:53 EDT Ventricular Rate:   89 PR Interval:  169 QRS Duration: 98 QT Interval:  346 QTC Calculation: 421 R Axis:   82 Text Interpretation: Sinus rhythm Confirmed by Linwood Dibbles [phone]) on 10/19/2021 5:19:26 PM  Radiology CT Head Wo Contrast  Result Date: 10/19/2021 CLINICAL DATA:  Left-sided tingling since last night with headache. EXAM: CT HEAD WITHOUT CONTRAST TECHNIQUE: Contiguous axial images were obtained from the base of the skull through the vertex without intravenous contrast. RADIATION DOSE REDUCTION: This exam was performed according to the departmental dose-optimization program which includes automated exposure control, adjustment of the mA and/or kV according to patient size and/or use of iterative reconstruction technique. COMPARISON:  Head CT March 17, 2021 FINDINGS: Brain: No evidence of acute infarction, hemorrhage, hydrocephalus, extra-axial collection or mass lesion/mass effect. Vascular: No hyperdense vessel or unexpected calcification. Skull: Normal. Negative for fracture or focal lesion. Sinuses/Orbits: No acute finding. Other: None. IMPRESSION: No acute intracranial abnormality. Electronically Signed   By: Maudry Mayhew M.D.   On: 10/19/2021 17:53    Procedures Procedures    Medications Ordered in ED Medications  sodium chloride 0.9 % bolus 1,000 mL (1,000 mLs Intravenous New Bag/Given 10/19/21 1754)    Followed by  0.9 %  sodium chloride infusion (has no administration in time range)  prochlorperazine (COMPAZINE) injection 10 mg (10 mg Intravenous Given 10/19/21 1755)  ketorolac (TORADOL) 30 MG/ML injection 30 mg (30 mg Intravenous Given 10/19/21 1755)    ED Course/ Medical Decision Making/ A&P Clinical Course as of 10/19/21 1935  Sat Oct 19, 2021  1928 Basic metabolic panel Normal [JK]  1928 CBC(!) Normal [JK]  1928 CT Head Wo Contrast CT without acute findings [JK]  1934 Patient states her headache has resolved.  She is feeling better [JK]    Clinical Course User Index [JK] Linwood Dibbles, MD                           Medical Decision Making Differential diagnosis includes complex migraine, brain tumor, cerebral hemorrhage, electrolyte abnormality  Amount and/or Complexity of Data Reviewed Labs: ordered. Decision-making details documented in ED Course. Radiology: ordered and independent interpretation performed. Decision-making details documented in ED Course.  Risk Prescription drug management.   Patient presented to the ED for evaluation of a headache associated with paresthesias on the left side.  Patient sensation was intact and there is no focal deficits noted.  CT scan however was performed considering her headache and neurologic complaints.  CT without acute findings.  Electrolytes unremarkable.  CBC unremarkable.  Patient was given a migraine cocktail.  Her symptoms have improved and she is feeling much better.  Suspect symptoms related to a migraine.  Low suspicion for stroke TIA.  Evaluation and diagnostic testing in the emergency department does not suggest an emergent condition requiring admission or immediate intervention beyond what has been performed at this time.  The patient is safe for discharge and has been instructed to return immediately for worsening symptoms, change in symptoms or any other concerns.         Final Clinical Impression(s) / ED Diagnoses Final diagnoses:  Other migraine without status migrainosus, not intractable    Rx / DC Orders ED Discharge Orders     None         Linwood Dibbles, MD 10/19/21 [phone]

## 2021-10-19 NOTE — ED Triage Notes (Signed)
Pt with tingling on left side that started last night-initially started left side of face and neck, today around 1200 tingling has moved to left leg. + HA on left. Left face pain for past 2 days.

## 2021-10-19 NOTE — Discharge Instructions (Signed)
Your blood tests and CAT scan were normal.  Your symptoms were most likely related to a migraine headache.  Take over-the-counter medications as needed such excedrin migraine, or advil migraine.

## 2021-10-19 NOTE — ED Notes (Signed)
Pt ambulated to restroom with steady gait.

## 2021-11-25 ENCOUNTER — Other Ambulatory Visit: Payer: Self-pay | Admitting: Family

## 2021-11-25 ENCOUNTER — Ambulatory Visit
Admission: RE | Admit: 2021-11-25 | Discharge: 2021-11-25 | Disposition: A | Discharge status: Home / Self Care | Payer: BLUE CROSS/BLUE SHIELD | Source: Ambulatory Visit | Attending: Family Medicine | Admitting: Family Medicine

## 2021-11-25 VITALS — BP 132/90 | HR 98 | Temp 98.8°F | Resp 18

## 2021-11-25 DIAGNOSIS — J209 Acute bronchitis, unspecified: Secondary | ICD-10-CM

## 2021-11-25 DIAGNOSIS — J3089 Other allergic rhinitis: Secondary | ICD-10-CM

## 2021-11-25 DIAGNOSIS — K2100 Gastro-esophageal reflux disease with esophagitis, without bleeding: Secondary | ICD-10-CM

## 2021-11-25 MED ORDER — CETIRIZINE HCL 10 MG PO TABS: 10.0000 mg | ORAL_TABLET | Freq: Every day | ORAL | 2 refills | Status: DC

## 2021-11-25 MED ORDER — PROMETHAZINE-DM 6.25-15 MG/5ML PO SYRP: 5.0000 mL | ORAL_SOLUTION | Freq: Four times a day (QID) | ORAL | 0 refills | Status: DC | PRN

## 2021-11-25 MED ORDER — PREDNISONE 20 MG PO TABS: 40.0000 mg | ORAL_TABLET | Freq: Every day | ORAL | 0 refills | Status: DC

## 2021-11-25 MED ORDER — FLUTICASONE PROPIONATE 50 MCG/ACT NA SUSP: 1.0000 | Freq: Two times a day (BID) | NASAL | 2 refills | Status: DC

## 2021-11-25 NOTE — ED Provider Notes (Signed)
[facility]    CSN: [id number] Arrival date & time: 11/25/21  1216      History   Chief Complaint Chief Complaint  Patient presents with   Cough    Dry coughing chest and back soreeee like sharp pain - Entered by patient    HPI Anna Andrews is a 26 y.o. female.   Patient presenting today with 2-week history of hacking cough, chest soreness with coughing, postnasal drip, sinus pressure, sinus headaches, left ear pressure and pain.  Denies fever, chills, chest pain, shortness of breath, abdominal pain, nausea vomiting or diarrhea.  She has taken Allegra here and there which only helps for the day per patient.  No known sick contacts recently.  No known history of chronic pulmonary disease.    Past Medical History:  Diagnosis Date   Anxiety    Eczema    Gestational hypertension 04/27/2019   Supervision of normal first pregnancy 10/19/2018     FAMILY TREE  LAB RESULTS  Language English Pap 08/20/16 neg  Initiated care at [facility] GC/CT Initial: neg/neg        36wks: neg/neg  Dating by LMP c/w 9wk U/S    Support person Nathaneil [facility] [employer] NT/IT/AFP/MaterniT21:declined    [place]/HgbE neg  Flu vaccine 12/22/18  CF declined  TDaP vaccine 02/16/19  SMA declined  Rhogam N/A Fragile X declined       Anatomy US Normal female Blood Type A/Positive/-- (08/11     Patient Active Problem List   Diagnosis Date Noted   Acute non-recurrent frontal sinusitis 07/22/2021   Gastroesophageal reflux disease with esophagitis without hemorrhage 05/30/2021   Menorrhagia with regular cycle 05/29/2021   Encounter for menstrual regulation 05/29/2021   Pelvic pain 05/14/2021   Acute right-sided low back pain 05/14/2021   Symptoms of urinary tract infection 05/14/2021   Late period 05/14/2021   Right calf pain 05/14/2021   Dysmenorrhea 05/14/2021   Dyspareunia in female 05/14/2021   Strep sore throat 04/10/2021   Influenza 02/14/2021   Annual physical exam 01/25/2021   Pain and swelling of right lower leg  01/25/2021   Obesity (BMI 30-39.9) 01/25/2021   Anxiety 10/19/2018    Past Surgical History:  Procedure Laterality Date   NO PAST SURGERIES      OB History     Gravida  1   Para  1   Term  1   Preterm  0   AB  0   Living  1      SAB  0   IAB  0   Ectopic  0   Multiple  0   Live Births  1            Home Medications    Prior to Admission medications   Medication Sig Start Date End Date Taking? Authorizing Provider  cetirizine (ZYRTEC ALLERGY) 10 MG tablet Take 1 tablet (10 mg total) by mouth daily. 11/25/21  Yes Volney American, PA-C  EPINEPHrine 0.3 mg/0.3 mL IJ SOAJ injection Inject 0.3 mg into the muscle as needed for anaphylaxis. 03/11/21  Yes Mesner, Corene Cornea, MD  fluticasone (FLONASE) 50 MCG/ACT nasal spray Place 1 spray into both nostrils 2 (two) times daily. 11/25/21  Yes Volney American, PA-C  predniSONE (DELTASONE) 20 MG tablet Take 2 tablets (40 mg total) by mouth daily with breakfast. 11/25/21  Yes Volney American, PA-C  promethazine-dextromethorphan (PROMETHAZINE-DM) 6.25-15 MG/5ML syrup Take 5 mLs by mouth 4 (four) times daily as needed. 11/25/21  Yes Volney American, PA-C  acetaminophen (TYLENOL) 500 MG tablet Take 500 mg by mouth every 6 (six) hours as needed.    [provider]  pantoprazole (PROTONIX) 20 MG tablet Take 1 tablet (20 mg total) by mouth daily. Take 1 pill twice a day for 2 weeks, then 1 pill every morning. 05/30/21   Jeanie Sewer, NP  sertraline (ZOLOFT) 50 MG tablet Take 1 tablet (50 mg total) by mouth daily. 06/10/21   Estill Dooms, NP  enalapril (VASOTEC) 5 MG tablet Take 1 tablet (5 mg total) by mouth daily. Patient not taking: Reported on 06/02/2019 05/05/19 11/02/19  Christin Fudge, CNM    Family History Family History  Problem Relation Age of Onset   Hypertension Paternal Grandfather    CAD Paternal Grandmother    Hypertension Mother     Social History Social History    Tobacco Use   Smoking status: Former    Packs/day: 0.50    Types: Cigarettes   Smokeless tobacco: Never   Tobacco comments:    occ  Vaping Use   Vaping Use: Never used  Substance Use Topics   Alcohol use: No   Drug use: No     Allergies   Pineapple   Review of Systems Review of Systems Per HPI  Physical Exam Triage Vital Signs ED Triage Vitals [11/25/21 1237]  Enc Vitals Group     BP (!) 132/90     Pulse Rate 98     Resp 18     Temp 98.8 F (37.1 C)     Temp Source Oral     SpO2 98 %     Weight      Height      Head Circumference      Peak Flow      Pain Score 8     Pain Loc      Pain Edu?      Excl. in [place]?    No data found.  Updated Vital Signs BP (!) 132/90 (BP Location: Right Arm)   Pulse 98   Temp 98.8 F (37.1 C) (Oral)   Resp 18   LMP 10/26/2021 (Approximate)   SpO2 98%   Visual Acuity Right Eye Distance:   Left Eye Distance:   Bilateral Distance:    Right Eye Near:   Left Eye Near:    Bilateral Near:     Physical Exam Vitals and nursing note reviewed.  Constitutional:      Appearance: Normal appearance.  HENT:     Head: Atraumatic.     Right Ear: Tympanic membrane and external ear normal.     Left Ear: Tympanic membrane and external ear normal.     Nose: Rhinorrhea present.     Mouth/Throat:     Mouth: Mucous membranes are moist.     Pharynx: Posterior oropharyngeal erythema present.  Eyes:     Extraocular Movements: Extraocular movements intact.     Conjunctiva/sclera: Conjunctivae normal.  Cardiovascular:     Rate and Rhythm: Normal rate and regular rhythm.     Heart sounds: Normal heart sounds.  Pulmonary:     Effort: Pulmonary effort is normal.     Breath sounds: Normal breath sounds. No wheezing or rales.  Musculoskeletal:        General: Normal range of motion.     Cervical back: Normal range of motion and neck supple.  Skin:    General: Skin is warm and dry.  Neurological:  Mental Status: She is alert and  oriented to person, place, and time.  Psychiatric:        Mood and Affect: Mood normal.        Thought Content: Thought content normal.      UC Treatments / Results  Labs (all labs ordered are listed, but only abnormal results are displayed) Labs Reviewed - No data to display  EKG   Radiology No results found.  Procedures Procedures (including critical care time)  Medications Ordered in UC Medications - No data to display  Initial Impression / Assessment and Plan / UC Course  I have reviewed the triage vital signs and the nursing notes.  Pertinent labs & imaging results that were available during my care of the patient were reviewed by me and considered in my medical decision making (see chart for details).     Suspect bronchitis secondary to uncontrolled seasonal allergies.  States symptoms are worse at night when her postnasal drip starts acting up.  We will treat with course of prednisone, Phenergan DM, and restart good allergy regimen with Zyrtec and Flonase.  Discussed supportive over-the-counter medications and home care additionally.  Return for worsening symptoms.  Final Clinical Impressions(s) / UC Diagnoses   Final diagnoses:  Acute bronchitis, unspecified organism  Seasonal allergic rhinitis due to other allergic trigger   Discharge Instructions   None    ED Prescriptions     Medication Sig Dispense Auth. Provider   predniSONE (DELTASONE) 20 MG tablet Take 2 tablets (40 mg total) by mouth daily with breakfast. 10 tablet Volney American, PA-C   promethazine-dextromethorphan (PROMETHAZINE-DM) 6.25-15 MG/5ML syrup Take 5 mLs by mouth 4 (four) times daily as needed. 100 mL Volney American, PA-C   fluticasone [facility]) 50 MCG/ACT nasal spray Place 1 spray into both nostrils 2 (two) times daily. 16 g Volney American, [place]   cetirizine (ZYRTEC ALLERGY) 10 MG tablet Take 1 tablet (10 mg total) by mouth daily. 30 tablet Volney American,  [place]      PDMP not reviewed this encounter.   Volney American, [place] 11/25/21 1257

## 2021-11-25 NOTE — ED Triage Notes (Signed)
Pt states that she has had a cough x 2 weeks, her back feels tight when she takes a deep breath and her chest hurts from coughing so much. She also has left ear fullness. She hasnt taken any meds.

## 2021-12-02 ENCOUNTER — Encounter: Payer: Self-pay | Admitting: *Deleted

## 2021-12-06 ENCOUNTER — Ambulatory Visit
Admission: EM | Admit: 2021-12-06 | Discharge: 2021-12-06 | Disposition: A | Discharge status: Home / Self Care | Payer: BLUE CROSS/BLUE SHIELD | Attending: Emergency Medicine | Admitting: Emergency Medicine

## 2021-12-06 ENCOUNTER — Ambulatory Visit (INDEPENDENT_AMBULATORY_CARE_PROVIDER_SITE_OTHER): Payer: BLUE CROSS/BLUE SHIELD

## 2021-12-06 ENCOUNTER — Encounter: Payer: Self-pay | Admitting: Emergency Medicine

## 2021-12-06 DIAGNOSIS — R059 Cough, unspecified: Secondary | ICD-10-CM

## 2021-12-06 DIAGNOSIS — J019 Acute sinusitis, unspecified: Secondary | ICD-10-CM

## 2021-12-06 DIAGNOSIS — K219 Gastro-esophageal reflux disease without esophagitis: Secondary | ICD-10-CM | POA: Diagnosis not present

## 2021-12-06 DIAGNOSIS — R051 Acute cough: Secondary | ICD-10-CM | POA: Diagnosis not present

## 2021-12-06 DIAGNOSIS — K2100 Gastro-esophageal reflux disease with esophagitis, without bleeding: Secondary | ICD-10-CM

## 2021-12-06 DIAGNOSIS — Z760 Encounter for issue of repeat prescription: Secondary | ICD-10-CM

## 2021-12-06 MED ORDER — PROMETHAZINE-DM 6.25-15 MG/5ML PO SYRP: 5.0000 mL | ORAL_SOLUTION | Freq: Four times a day (QID) | ORAL | 0 refills | Status: DC | PRN

## 2021-12-06 MED ORDER — PANTOPRAZOLE SODIUM 20 MG PO TBEC: 20.0000 mg | DELAYED_RELEASE_TABLET | Freq: Every day | ORAL | 0 refills | Status: DC

## 2021-12-06 MED ORDER — DOXYCYCLINE HYCLATE 100 MG PO CAPS: 100.0000 mg | ORAL_CAPSULE | Freq: Two times a day (BID) | ORAL | 0 refills | Status: AC

## 2021-12-06 MED ORDER — AEROCHAMBER MV MISC: 1 refills | Status: DC

## 2021-12-06 NOTE — Discharge Instructions (Addendum)
Your chest x-ray was negative for pneumonia.  I suspect you have combination of sinusitis and acid reflux.  Finish the doxycycline, even if you feel better.  Continue Flonase, start saline nasal irrigation with a Milta Deiters Med rinse and distilled water as often as you want.  Discontinue the Zyrtec, start Mucinex D.  Promethazine DM as needed for cough.  2 puffs from your albuterol inhaler every 4 hours for 2 days, then every 6 hours for 2 days, then as needed.  You can back off on the albuterol if you start to improve sooner.  Restart the Protonix.

## 2021-12-06 NOTE — ED Triage Notes (Signed)
Was seen a week ago for cough and chest tightness.  States symptoms are getting worse.  States voice is horse.  States she is coughing up green sputum.

## 2021-12-06 NOTE — ED Provider Notes (Signed)
HPI  SUBJECTIVE:  Anna Andrews is a 26 y.o. female who presents with 3 weeks of chest congestion, nasal congestion, green rhinorrhea, cough productive of the same material as her nasal congestion, sinus pain and pressure, postnasal drip, sore throat from the coughing, hoarse, raspy voice, left ear pain and decreased hearing.  She is unable to sleep at night secondary to the cough.  She does not grind her teeth at night.  She also reports GERD symptoms of belching.  No allergy symptoms.  No fevers.  She was seen here 9/18 with similar symptoms,  suspected to have uncontrolled seasonal allergies and thought to have bronchitis.  She was treated with prednisone, Phenergan DM, and started back on Zyrtec and Flonase.  She states that she has been taking these without improvement in her symptoms.  She has not tried using her albuterol inhaler.  No alleviating factors.  Symptoms are worse with lying down.  She was on antibiotics 2 months ago for otitis media.  No antipyretic in the past 6 hours.  She is a smoker, and has a history of GERD, ran out of her Protonix 1 month ago.  No history of pulmonary disease, TMJ arthralgia.  LMP: Now.  Denies possibility of being pregnant.  PCP: [facility].  Past Medical History:  Diagnosis Date   Anxiety    Eczema    Gestational hypertension 04/27/2019   Supervision of normal first pregnancy 10/19/2018     FAMILY TREE  LAB RESULTS  Language English Pap 08/20/16 neg  Initiated care at [facility] GC/CT Initial: neg/neg        36wks: neg/neg  Dating by LMP c/w 9wk U/S    Support person Martie Lee NT/IT/AFP/MaterniT21:declined    [place]/HgbE neg  Flu vaccine 12/22/18  CF declined  TDaP vaccine 02/16/19  SMA declined  Rhogam N/A Fragile X declined       Anatomy US Normal female Blood Type A/Positive/-- (08/11     Past Surgical History:  Procedure Laterality Date   NO PAST SURGERIES      Family History  Problem Relation Age of Onset   Hypertension Paternal Grandfather     CAD Paternal Grandmother    Hypertension Mother     Social History   Tobacco Use   Smoking status: Former    Packs/day: 0.50    Types: Cigarettes   Smokeless tobacco: Never   Tobacco comments:    occ  Vaping Use   Vaping Use: Never used  Substance Use Topics   Alcohol use: No   Drug use: No    No current facility-administered medications for this encounter.  Current Outpatient Medications:    doxycycline (VIBRAMYCIN) 100 MG capsule, Take 1 capsule (100 mg total) by mouth 2 (two) times daily for 10 days., Disp: 20 capsule, Rfl: 0   pantoprazole (PROTONIX) 20 MG tablet, Take 1 tablet (20 mg total) by mouth daily., Disp: 30 tablet, Rfl: 0   Spacer/Aero-Holding Chambers (AEROCHAMBER MV) inhaler, Use as instructed, Disp: 1 each, Rfl: 1   acetaminophen (TYLENOL) 500 MG tablet, Take 500 mg by mouth every 6 (six) hours as needed., Disp: , Rfl:    EPINEPHrine 0.3 mg/0.3 mL IJ SOAJ injection, Inject 0.3 mg into the muscle as needed for anaphylaxis., Disp: 2 each, Rfl: 1   fluticasone (FLONASE) 50 MCG/ACT nasal spray, Place 1 spray into both nostrils 2 (two) times daily., Disp: 16 g, Rfl: 2   promethazine-dextromethorphan (PROMETHAZINE-DM) 6.25-15 MG/5ML syrup, Take 5 mLs by mouth  4 (four) times daily as needed., Disp: 150 mL, Rfl: 0   sertraline (ZOLOFT) 50 MG tablet, Take 1 tablet (50 mg total) by mouth daily., Disp: 30 tablet, Rfl: 6  Allergies  Allergen Reactions   Pineapple Swelling     ROS  As noted in HPI.   Physical Exam  BP 133/85 (BP Location: Right Arm)   Pulse 100   Temp 98.7 F (37.1 C) (Oral)   Resp 18   LMP 12/02/2021 (Exact Date)   SpO2 98%   Constitutional: Well developed, well nourished, no acute distress Eyes:  EOMI, conjunctiva normal bilaterally HENT: Normocephalic, atraumatic,mucus membranes moist.  Purulent nasal congestion.  Erythematous, swollen turbinates.  No maxillary, frontal sinus tenderness.  No obvious postnasal drip.  Normal oropharynx,  tonsils.  Left ear: No pain with traction on pinna, palpation of mastoid.  External ear, EAC, TM intact.  Tenderness at the tragus and TMJ.  No crepitus. Respiratory: Normal inspiratory effort, good air movement, lungs clear bilaterally.  No anterior, lateral chest wall tenderness. Cardiovascular: Normal rate, regular rhythm, no murmurs, rubs, gallops GI: nondistended skin: No rash, skin intact Musculoskeletal: no deformities Neurologic: Alert & oriented x 3, no focal neuro deficits Psychiatric: Speech and behavior appropriate   ED Course   Medications - No data to display  Orders Placed This Encounter  Procedures   DG Chest 2 View    Standing Status:   Standing    Number of Occurrences:   1    Order Specific Question:   Reason for Exam (SYMPTOM  OR DIAGNOSIS REQUIRED)    Answer:   cough 3 weeks r./o PNA    No results found for this or any previous visit (from the past 24 hour(s)). DG Chest 2 View  Result Date: 12/06/2021 CLINICAL DATA:  Cough for 3 weeks EXAM: CHEST - 2 VIEW COMPARISON:  06/19/2021 FINDINGS: The heart size and mediastinal contours are within normal limits. Both lungs are clear. The visualized skeletal structures are unremarkable. IMPRESSION: No active cardiopulmonary disease. Electronically Signed   By: Sharlet Salina M.D.   On: 12/06/2021 20:05    ED Clinical Impression  1. Acute non-recurrent sinusitis, unspecified location   2. Gastroesophageal reflux disease without esophagitis   3. Medication refill   4. Acute cough   5. Gastroesophageal reflux disease with esophagitis without hemorrhage      ED Assessment/Plan     Previous records reviewed.  As noted in HPI.  Reviewed imaging independently.  Normal chest x-ray.  See radiology report for full details.  I suspect that the patient now has a sinus infection.  will check chest x-ray to rule out pneumonia, although it will not change management.  I also suspect that her GERD is contributing to her  cough.  She does not have any evidence of an otitis media, she does have some TMJ tenderness, but I also wonder if she could have some eustachian tube dysfunction.  Plan to send home on 10 days of doxycycline, continue Flonase, start saline nasal irrigation, discontinue Zyrtec, because it is not helping.  Start Mucinex D.  We will refill her Promethazine DM, Protonix, and have her start using her albuterol inhaler with a spacer.  States she does not need a refill on her albuterol.  We will try regularly scheduled albuterol for the next 4 days.  Discussed  imaging, MDM, treatment plan, and plan for follow-up with patient. Discussed sn/sx that should prompt return to the ED. patient agrees with plan.  Meds ordered this encounter  Medications   pantoprazole (PROTONIX) 20 MG tablet    Sig: Take 1 tablet (20 mg total) by mouth daily.    Dispense:  30 tablet    Refill:  0   promethazine-dextromethorphan (PROMETHAZINE-DM) 6.25-15 MG/5ML syrup    Sig: Take 5 mLs by mouth 4 (four) times daily as needed.    Dispense:  150 mL    Refill:  0   Spacer/Aero-Holding Chambers (AEROCHAMBER MV) inhaler    Sig: Use as instructed    Dispense:  1 each    Refill:  1   doxycycline (VIBRAMYCIN) 100 MG capsule    Sig: Take 1 capsule (100 mg total) by mouth 2 (two) times daily for 10 days.    Dispense:  20 capsule    Refill:  0      *This clinic note was created using Scientist, clinical (histocompatibility and immunogenetics). Therefore, there may be occasional mistakes despite careful proofreading.  ?    Domenick Gong, MD 12/06/21 2014

## 2021-12-08 DIAGNOSIS — Z419 Encounter for procedure for purposes other than remedying health state, unspecified: Secondary | ICD-10-CM | POA: Diagnosis not present

## 2021-12-18 ENCOUNTER — Other Ambulatory Visit: Payer: Self-pay

## 2021-12-18 ENCOUNTER — Emergency Department (HOSPITAL_COMMUNITY)
Admission: EM | Admit: 2021-12-18 | Discharge: 2021-12-19 | Disposition: A | Discharge status: Home / Self Care | Payer: BLUE CROSS/BLUE SHIELD | Attending: Emergency Medicine | Admitting: Emergency Medicine

## 2021-12-18 ENCOUNTER — Encounter (HOSPITAL_COMMUNITY): Payer: Self-pay | Admitting: Emergency Medicine

## 2021-12-18 ENCOUNTER — Emergency Department (HOSPITAL_COMMUNITY): Payer: BLUE CROSS/BLUE SHIELD

## 2021-12-18 DIAGNOSIS — J181 Lobar pneumonia, unspecified organism: Secondary | ICD-10-CM | POA: Insufficient documentation

## 2021-12-18 DIAGNOSIS — R109 Unspecified abdominal pain: Secondary | ICD-10-CM | POA: Insufficient documentation

## 2021-12-18 DIAGNOSIS — J189 Pneumonia, unspecified organism: Secondary | ICD-10-CM

## 2021-12-18 DIAGNOSIS — R0781 Pleurodynia: Secondary | ICD-10-CM | POA: Diagnosis present

## 2021-12-18 LAB — URINALYSIS, ROUTINE W REFLEX MICROSCOPIC
Bilirubin Urine: NEGATIVE
Glucose, UA: NEGATIVE mg/dL
Ketones, ur: NEGATIVE mg/dL
Leukocytes,Ua: NEGATIVE
Nitrite: NEGATIVE
Protein, ur: NEGATIVE mg/dL
Specific Gravity, Urine: 1.006 (ref 1.005–1.030)
pH: 6 (ref 5.0–8.0)

## 2021-12-18 LAB — PREGNANCY, URINE: Preg Test, Ur: NEGATIVE

## 2021-12-18 MED ORDER — ONDANSETRON HCL 4 MG/2ML IJ SOLN
4.0000 mg | Freq: Once | INTRAMUSCULAR | Status: AC
  Administered 2021-12-18: 4 mg via INTRAVENOUS
  Filled 2021-12-18: qty 2

## 2021-12-18 MED ORDER — OXYCODONE-ACETAMINOPHEN 5-325 MG PO TABS
1.0000 | ORAL_TABLET | Freq: Once | ORAL | Status: AC
  Administered 2021-12-18: 1 via ORAL
  Filled 2021-12-18: qty 1

## 2021-12-18 NOTE — ED Triage Notes (Addendum)
Pt c/o flank pain x today, frequent urination at night, chills, and nausea. Pain Sharp in character.Denies fever.  Pt also c/o sharp pain with deep breath in. She does not appear to be in any acute respiratory distress. Recently completed doxycycline for URI. Bryson Corona Edd Fabian

## 2021-12-19 ENCOUNTER — Emergency Department (HOSPITAL_COMMUNITY): Payer: BLUE CROSS/BLUE SHIELD

## 2021-12-19 LAB — CBC WITH DIFFERENTIAL/PLATELET
Abs Immature Granulocytes: 0.05 10*3/uL (ref 0.00–0.07)
Basophils Absolute: 0 10*3/uL (ref 0.0–0.1)
Basophils Relative: 0 %
Eosinophils Absolute: 0.1 10*3/uL (ref 0.0–0.5)
Eosinophils Relative: 1 %
HCT: 37.3 % (ref 36.0–46.0)
Hemoglobin: 12.1 g/dL (ref 12.0–15.0)
Immature Granulocytes: 1 %
Lymphocytes Relative: 26 %
Lymphs Abs: 2.5 10*3/uL (ref 0.7–4.0)
MCH: 26.2 pg (ref 26.0–34.0)
MCHC: 32.4 g/dL (ref 30.0–36.0)
MCV: 80.9 fL (ref 80.0–100.0)
Monocytes Absolute: 0.4 10*3/uL (ref 0.1–1.0)
Monocytes Relative: 5 %
Neutro Abs: 6.5 10*3/uL (ref 1.7–7.7)
Neutrophils Relative %: 67 %
Platelets: 343 10*3/uL (ref 150–400)
RBC: 4.61 MIL/uL (ref 3.87–5.11)
RDW: 14.3 % (ref 11.5–15.5)
WBC: 9.6 10*3/uL (ref 4.0–10.5)
nRBC: 0 % (ref 0.0–0.2)

## 2021-12-19 LAB — COMPREHENSIVE METABOLIC PANEL
ALT: 20 U/L (ref 0–44)
AST: 20 U/L (ref 15–41)
Albumin: 4.2 g/dL (ref 3.5–5.0)
Alkaline Phosphatase: 64 U/L (ref 38–126)
Anion gap: 7 (ref 5–15)
BUN: 11 mg/dL (ref 6–20)
CO2: 26 mmol/L (ref 22–32)
Calcium: 9.1 mg/dL (ref 8.9–10.3)
Chloride: 104 mmol/L (ref 98–111)
Creatinine, Ser: 0.69 mg/dL (ref 0.44–1.00)
GFR, Estimated: >60 mL/min (ref >60)
Glucose, Bld: 87 mg/dL (ref 70–99)
Potassium: 3.8 mmol/L (ref 3.5–5.1)
Sodium: 137 mmol/L (ref 135–145)
Total Bilirubin: 0.4 mg/dL (ref 0.3–1.2)
Total Protein: 8.5 g/dL — ABNORMAL HIGH (ref 6.5–8.1)

## 2021-12-19 LAB — LIPASE, BLOOD: Lipase: 25 U/L (ref 11–51)

## 2021-12-19 LAB — D-DIMER, QUANTITATIVE: D-Dimer, Quant: 0.3 ug/mL-FEU (ref 0.00–0.50)

## 2021-12-19 MED ORDER — LEVOFLOXACIN 500 MG PO TABS: 500.0000 mg | ORAL_TABLET | Freq: Every day | ORAL | 0 refills | Status: DC

## 2021-12-19 MED ORDER — LEVOFLOXACIN 500 MG PO TABS
500.0000 mg | ORAL_TABLET | Freq: Once | ORAL | Status: AC
  Administered 2021-12-19: 500 mg via ORAL
  Filled 2021-12-19: qty 1

## 2021-12-19 NOTE — ED Provider Notes (Signed)
[facility] EMERGENCY DEPARTMENT Provider Note   CSN: [id number] Arrival date & time: 12/18/21  1937     History  Chief Complaint  Patient presents with   Flank Pain    Anna Andrews is a 26 y.o. female.  25 year old female who presents the ER today secondary to back pain.  Seems to get mostly in the right side mid back.  Has also had some radiation to her chest with some pleuritic pain.  She also has some left-sided pain as well.  She also states that she recently had COVID and had shoulder pain but that seems to have improved at this point.  Still has little bit of a nonproductive cough.  No fevers.  No nausea or vomiting.  No other associated symptoms.  No lower extremity swelling.   Flank Pain       Home Medications Prior to Admission medications   Medication Sig Start Date End Date Taking? Authorizing Provider  levofloxacin (LEVAQUIN) 500 MG tablet Take 1 tablet (500 mg total) by mouth daily. 12/19/21  Yes Roza Creamer, Barbara Cower, MD  acetaminophen (TYLENOL) 500 MG tablet Take 500 mg by mouth every 6 (six) hours as needed.    [provider]  EPINEPHrine 0.3 mg/0.3 mL IJ SOAJ injection Inject 0.3 mg into the muscle as needed for anaphylaxis. 03/11/21   Mrytle Bento, Barbara Cower, MD  fluticasone (FLONASE) 50 MCG/ACT nasal spray Place 1 spray into both nostrils 2 (two) times daily. 11/25/21   Particia Nearing, PA-C  pantoprazole (PROTONIX) 20 MG tablet Take 1 tablet (20 mg total) by mouth daily. 12/06/21   Domenick Gong, MD  promethazine-dextromethorphan (PROMETHAZINE-DM) 6.25-15 MG/5ML syrup Take 5 mLs by mouth 4 (four) times daily as needed. 12/06/21   Domenick Gong, MD  sertraline (ZOLOFT) 50 MG tablet Take 1 tablet (50 mg total) by mouth daily. 06/10/21   Adline Potter, NP  Spacer/Aero-Holding Deretha Emory (AEROCHAMBER MV) inhaler Use as instructed 12/06/21   Domenick Gong, MD  enalapril (VASOTEC) 5 MG tablet Take 1 tablet (5 mg total) by mouth daily. Patient not taking:  Reported on 06/02/2019 05/05/19 11/02/19  Jacklyn Shell, CNM      Allergies    Pineapple    Review of Systems   Review of Systems  Genitourinary:  Positive for flank pain.    Physical Exam Updated Vital Signs BP 110/78   Pulse 68   Temp 97.7 F (36.5 C) (Oral)   Resp 18   Ht 5\' 8"  (1.727 m)   Wt 106.6 kg   LMP 12/02/2021 (Exact Date)   SpO2 99%   BMI 35.73 kg/m  Physical Exam Vitals and nursing note reviewed.  Constitutional:      Appearance: She is well-developed.  HENT:     Head: Normocephalic and atraumatic.     Mouth/Throat:     Pharynx: No oropharyngeal exudate or posterior oropharyngeal erythema.  Eyes:     Pupils: Pupils are equal, round, and reactive to light.  Cardiovascular:     Rate and Rhythm: Normal rate and regular rhythm.  Pulmonary:     Effort: No respiratory distress.     Breath sounds: No stridor.  Abdominal:     General: Abdomen is flat. There is no distension.     Tenderness: There is no abdominal tenderness.  Musculoskeletal:        General: Tenderness (mild right-sided CVA) present.     Cervical back: Normal range of motion.  Skin:    General: Skin is warm and dry.  Neurological:     Mental Status: She is alert.     ED Results / Procedures / Treatments   Labs (all labs ordered are listed, but only abnormal results are displayed) Labs Reviewed  URINALYSIS, ROUTINE W REFLEX MICROSCOPIC - Abnormal; Notable for the following components:      Result Value   Color, Urine STRAW (*)    Hgb urine dipstick SMALL (*)    Bacteria, UA RARE (*)    All other components within normal limits  COMPREHENSIVE METABOLIC PANEL - Abnormal; Notable for the following components:   Total Protein 8.5 (*)    All other components within normal limits  URINE CULTURE  PREGNANCY, URINE  CBC WITH DIFFERENTIAL/PLATELET  LIPASE, BLOOD  D-DIMER, QUANTITATIVE (NOT AT [facility])    EKG EKG Interpretation  Date/Time:  Thursday December 19 2021 00:01:31  EDT Ventricular Rate:  81 PR Interval:  193 QRS Duration: 97 QT Interval:  379 QTC Calculation: 440 R Axis:   62 Text Interpretation: Sinus rhythm Confirmed by Merrily Pew [phone]) on 12/19/2021 2:56:49 AM  Radiology CT Renal Stone Study  Result Date: 12/19/2021 CLINICAL DATA:  Flank pain. Kidney stones suspected. Frequent urination, chills, and nausea. EXAM: CT ABDOMEN AND PELVIS WITHOUT CONTRAST TECHNIQUE: Multidetector CT imaging of the abdomen and pelvis was performed following the standard protocol without IV contrast. RADIATION DOSE REDUCTION: This exam was performed according to the departmental dose-optimization program which includes automated exposure control, adjustment of the mA and/or kV according to patient size and/or use of iterative reconstruction technique. COMPARISON:  05/22/2021 FINDINGS: Lower chest: Lung bases are clear. Hepatobiliary: No focal liver abnormality is seen. No gallstones, gallbladder wall thickening, or biliary dilatation. Pancreas: Unremarkable. No pancreatic ductal dilatation or surrounding inflammatory changes. Spleen: Normal in size without focal abnormality. Adrenals/Urinary Tract: Adrenal glands are unremarkable. Kidneys are normal, without renal calculi, focal lesion, or hydronephrosis. Bladder wall is thickened, possibly due to under distention or possibly indicating cystitis. Correlate with urinalysis. Stomach/Bowel: Stomach is within normal limits. Appendix appears normal. No evidence of bowel wall thickening, distention, or inflammatory changes. Vascular/Lymphatic: No significant vascular findings are present. No enlarged abdominal or pelvic lymph nodes. Reproductive: Uterus and bilateral adnexa are unremarkable. Small amount of free fluid in the pelvis is likely physiologic. Other: No free air in the abdomen. Musculoskeletal: No acute or significant osseous findings. IMPRESSION: 1. No renal or ureteral stone or obstruction. 2. Mild bladder wall thickening  may be due to under distention or cystitis. 3. Small amount of free fluid in the pelvis is likely physiologic. Electronically Signed   By: Lucienne Capers M.D.   On: 12/19/2021 02:43   DG Chest 2 View  Result Date: 12/18/2021 CLINICAL DATA:  Chest pain, flank pain, chills, and nausea. EXAM: CHEST - 2 VIEW COMPARISON:  12/06/2021. FINDINGS: The heart size and mediastinal contours are within normal limits. Mild airspace disease is noted at the left lung base. No effusion or pneumothorax. No acute osseous abnormality. IMPRESSION: Mild atelectasis or infiltrate at the left lung base. Electronically Signed   By: Brett Fairy M.D.   On: 12/18/2021 23:35    Procedures Procedures    Medications Ordered in ED Medications  oxyCODONE-acetaminophen (PERCOCET/ROXICET) 5-325 MG per tablet 1 tablet (1 tablet Oral Given 12/18/21 2353)  ondansetron (ZOFRAN) injection 4 mg (4 mg Intravenous Given 12/18/21 2352)  levofloxacin (LEVAQUIN) tablet 500 mg (500 mg Oral Given 12/19/21 0319)    ED Course/ Medical Decision Making/ A&P  Medical Decision Making Amount and/or Complexity of Data Reviewed Labs: ordered. Radiology: ordered. ECG/medicine tests: ordered.  Risk Prescription drug management.   Patient with multiple complaints and wonder if she has had some viral illness however she does have some hematuria so we will rule out kidney stone.  She has some pleuritic pain that radiates towards her back and was tachycardic when she first got here so we will get a D-dimer to rule out PE.  Suspicion for cardiac causes.  Also consider possible GI causes.  We will treat symptomatically pending workup.  Workup c/w pneumonia. Would explain most of her symptoms. Will initiate levaquin to cover if she ends up having a UTI (bacturia, hematuria, bladder wall thickening on ct). Stable for d/c.   Final Clinical Impression(s) / ED Diagnoses Final diagnoses:  Community acquired pneumonia of  left lower lobe of lung    Rx / DC Orders ED Discharge Orders          Ordered    levofloxacin (LEVAQUIN) 500 MG tablet  Daily        12/19/21 0258              Iretta Mangrum, Barbara Cower, MD 12/19/21 7672

## 2021-12-19 NOTE — ED Notes (Signed)
Patient transported to CT 

## 2021-12-20 ENCOUNTER — Encounter: Payer: Self-pay | Admitting: Family

## 2021-12-20 ENCOUNTER — Ambulatory Visit (INDEPENDENT_AMBULATORY_CARE_PROVIDER_SITE_OTHER): Payer: BLUE CROSS/BLUE SHIELD | Admitting: Family

## 2021-12-20 VITALS — BP 115/80 | HR 74 | Temp 97.1°F | Ht 68.0 in | Wt 241.0 lb

## 2021-12-20 DIAGNOSIS — J189 Pneumonia, unspecified organism: Secondary | ICD-10-CM

## 2021-12-20 DIAGNOSIS — M542 Cervicalgia: Secondary | ICD-10-CM | POA: Diagnosis not present

## 2021-12-20 LAB — URINE CULTURE: Culture: NO GROWTH

## 2021-12-20 NOTE — Progress Notes (Signed)
Patient ID: Anna Andrews, female    DOB: 31-Mar-1995, 26 y.o.   MRN: [id number]  Chief Complaint  Patient presents with   Neck Pain    Pt c/o neck pain/stiffness with head heaviness for about 4 weeks. Has tried tylenol for the pain.  ED follow up on 10/11.     HPI:      Neck pain:  recent hx: seen by UC and told she had a sinus infection after 3 weeks of sx, received steroids and abt  - DOXY & had finished a few days prior, then had mid back pain and told she had mild pneumonia in LLL and started on Levaquin x 7d. Prior to all the above she c/o neck stiffness down to upper back and concerned she also has meningitis. Pt works as a Producer, television/film/video using upper arms frequently.  Assessment & Plan:  1. Cervicalgia Advised pt she does not have the typical signs of meningitis, her pain is more musculoskeletal in nature from overuse. Advised on using Ibuprofen up to 600mg  tid prn or 1-2 generic Aleve bid, apply heat and/or analgesic creams for up to tid. If pain persists will look at PT.  2. Community acquired pneumonia of left lower lobe of lung - seen in ER a few days ago d/t sharp middle back pain, & pain with deep breathing and per CXR she has mild LLL PNA, given Levaquin for 7d. Pt denies cough or fever. She is concerned with taking so many antibiotics already this year. Advised ok to stop after 5 days if no sx, otherwise continue the full 7d. Advised tiredness can continue for several weeks after.   Subjective:    Outpatient Medications Prior to Visit  Medication Sig Dispense Refill   acetaminophen (TYLENOL) 500 MG tablet Take 500 mg by mouth every 6 (six) hours as needed.     EPINEPHrine 0.3 mg/0.3 mL IJ SOAJ injection Inject 0.3 mg into the muscle as needed for anaphylaxis. 2 each 1   fluticasone (FLONASE) 50 MCG/ACT nasal spray Place 1 spray into both nostrils 2 (two) times daily. 16 g 2   levofloxacin (LEVAQUIN) 500 MG tablet Take 1 tablet (500 mg total) by mouth daily. 7 tablet 0    pantoprazole (PROTONIX) 20 MG tablet Take 1 tablet (20 mg total) by mouth daily. 30 tablet 0   sertraline (ZOLOFT) 50 MG tablet Take 1 tablet (50 mg total) by mouth daily. 30 tablet 6   Spacer/Aero-Holding Chambers (AEROCHAMBER MV) inhaler Use as instructed 1 each 1   promethazine-dextromethorphan (PROMETHAZINE-DM) 6.25-15 MG/5ML syrup Take 5 mLs by mouth 4 (four) times daily as needed. (Patient not taking: Reported on 12/20/2021) 150 mL 0   No facility-administered medications prior to visit.   Past Medical History:  Diagnosis Date   Anxiety    Eczema    Gestational hypertension 04/27/2019   Supervision of normal first pregnancy 10/19/2018     FAMILY TREE  LAB RESULTS  Language English Pap 08/20/16 neg  Initiated care at [facility] GC/CT Initial: neg/neg        36wks: neg/neg  Dating by LMP c/w 9wk U/S    Support person [facility] NT/IT/AFP/MaterniT21:declined    [place]/HgbE neg  Flu vaccine 12/22/18  CF declined  TDaP vaccine 02/16/19  SMA declined  Rhogam N/A Fragile X declined       Anatomy 14/09/20 Normal female Blood Type A/Positive/-- (08/11    Past Surgical History:  Procedure Laterality Date   NO PAST SURGERIES  Allergies  Allergen Reactions   Pineapple Swelling      Objective:    Physical Exam Vitals and nursing note reviewed.  Constitutional:      Appearance: Normal appearance.     Interventions: Face mask in place.  HENT:     Right Ear: Tympanic membrane and ear canal normal.     Left Ear: Tympanic membrane and ear canal normal.     Nose:     Right Sinus: No frontal sinus tenderness.     Left Sinus: No frontal sinus tenderness.     Mouth/Throat:     Mouth: Mucous membranes are moist.     Pharynx: No pharyngeal swelling, oropharyngeal exudate, posterior oropharyngeal erythema or uvula swelling.     Tonsils: No tonsillar exudate or tonsillar abscesses.  Cardiovascular:     Rate and Rhythm: Normal rate and regular rhythm.  Pulmonary:     Effort: Pulmonary effort is normal.      Breath sounds: Normal breath sounds.  Musculoskeletal:        General: Normal range of motion.  Lymphadenopathy:     Head:     Right side of head: No preauricular or posterior auricular adenopathy.     Left side of head: No preauricular or posterior auricular adenopathy.     Cervical: No cervical adenopathy.  Skin:    General: Skin is warm and dry.  Neurological:     Mental Status: She is alert.  Psychiatric:        Mood and Affect: Mood normal.        Behavior: Behavior normal.    BP 115/80 (BP Location: Left Arm, Patient Position: Sitting, Cuff Size: Large)   Pulse 74   Temp (!) 97.1 F (36.2 C) (Temporal)   Ht 5\' 8"  (1.727 m)   Wt 241 lb (109.3 kg)   LMP 12/02/2021 (Exact Date)   SpO2 100%   BMI 36.64 kg/m  Wt Readings from Last 3 Encounters:  12/20/21 241 lb (109.3 kg)  12/18/21 235 lb (106.6 kg)  10/19/21 237 lb (107.5 kg)       Jeanie Sewer, NP

## 2021-12-26 ENCOUNTER — Encounter: Payer: Self-pay | Admitting: Family

## 2021-12-27 NOTE — Telephone Encounter (Signed)
Yes - she had a MILD case of pneumonia - and she was on strong antibiotic - should not need another round. Take 2 generic Aleve twice a day (preferred) or up to 600mg  Ibuprofen 2-3 x/day - thx

## 2022-01-02 ENCOUNTER — Encounter: Payer: Self-pay | Admitting: Family

## 2022-01-02 DIAGNOSIS — R053 Chronic cough: Secondary | ICD-10-CM

## 2022-01-03 ENCOUNTER — Other Ambulatory Visit: Payer: Self-pay | Admitting: Family

## 2022-01-03 MED ORDER — BENZONATATE 200 MG PO CAPS: 200.0000 mg | ORAL_CAPSULE | Freq: Three times a day (TID) | ORAL | 0 refills | Status: AC | PRN

## 2022-01-14 ENCOUNTER — Ambulatory Visit
Admission: RE | Admit: 2022-01-14 | Discharge: 2022-01-14 | Disposition: A | Discharge status: Home / Self Care | Payer: BLUE CROSS/BLUE SHIELD | Source: Ambulatory Visit | Attending: Nurse Practitioner | Admitting: Nurse Practitioner

## 2022-01-14 VITALS — BP 124/83 | HR 104 | Temp 99.1°F | Resp 18

## 2022-01-14 DIAGNOSIS — J020 Streptococcal pharyngitis: Secondary | ICD-10-CM | POA: Diagnosis not present

## 2022-01-14 LAB — POCT RAPID STREP A (OFFICE): Rapid Strep A Screen: POSITIVE — AB

## 2022-01-14 MED ORDER — PENICILLIN V POTASSIUM 500 MG PO TABS: 500.0000 mg | ORAL_TABLET | Freq: Two times a day (BID) | ORAL | 0 refills | Status: AC

## 2022-01-14 NOTE — Discharge Instructions (Signed)
Test is positive.  I am starting you on an antibiotic for your symptoms. Take medication as prescribed. Increase fluids and allow for plenty of rest. Warm salt water gargles 3-4 times daily while symptoms persist. Recommend a soft diet while symptoms persist to include soup, broth, yogurt, pudding, popsicles, or fruit. Discard your toothbrush after 3 days. Follow-up in this clinic or with your primary care physician if symptoms fail to improve.

## 2022-01-14 NOTE — ED Triage Notes (Signed)
Pt reports sore throat x 2 days. Robitussin, lidocaine, give no relief.   Pt reports she finished antibiotic for pneumonia 2 weeks ago.

## 2022-01-14 NOTE — ED Provider Notes (Signed)
[facility]    CSN: [id number] Arrival date & time: 01/14/22  1027      History   Chief Complaint Chief Complaint  Patient presents with   Sore Throat    Entered by patient   Appointment    1030    HPI Anna Andrews is a 26 y.o. female.   The history is provided by the patient.   Patient presents with a 2-day history of sore throat.  She denies fever, chills, cough, nasal congestion, headache, abdominal pain, nausea, vomiting, diarrhea.  She does state that she has ear fullness.  Reports that she recently finished an antibiotic for a sinus infection and for pneumonia.  She denies any known sick contacts, but states that her son does go to daycare.  She has been using Robitussin and lidocaine with minimal relief.  Past Medical History:  Diagnosis Date   Anxiety    Eczema    Gestational hypertension 04/27/2019   Supervision of normal first pregnancy 10/19/2018     FAMILY TREE  LAB RESULTS  Language English Pap 08/20/16 neg  Initiated care at [facility] GC/CT Initial: neg/neg        36wks: neg/neg  Dating by LMP c/w 9wk U/S    Support person [facility] NT/IT/AFP/MaterniT21:declined    [place]/HgbE neg  Flu vaccine 12/22/18  CF declined  TDaP vaccine 02/16/19  SMA declined  Rhogam N/A Fragile X declined       Anatomy US Normal female Blood Type A/Positive/-- (08/11     Patient Active Problem List   Diagnosis Date Noted   Acute non-recurrent frontal sinusitis 07/22/2021   Gastroesophageal reflux disease with esophagitis without hemorrhage 05/30/2021   Menorrhagia with regular cycle 05/29/2021   Encounter for menstrual regulation 05/29/2021   Pelvic pain 05/14/2021   Acute right-sided low back pain 05/14/2021   Symptoms of urinary tract infection 05/14/2021   Late period 05/14/2021   Right calf pain 05/14/2021   Dysmenorrhea 05/14/2021   Dyspareunia in female 05/14/2021   Strep sore throat 04/10/2021   Influenza 02/14/2021   Annual physical exam 01/25/2021   Pain and  swelling of right lower leg 01/25/2021   Obesity (BMI 30-39.9) 01/25/2021   Anxiety 10/19/2018    Past Surgical History:  Procedure Laterality Date   NO PAST SURGERIES      OB History     Gravida  1   Para  1   Term  1   Preterm  0   AB  0   Living  1      SAB  0   IAB  0   Ectopic  0   Multiple  0   Live Births  1            Home Medications    Prior to Admission medications   Medication Sig Start Date End Date Taking? Authorizing Provider  penicillin v potassium (VEETID) 500 MG tablet Take 1 tablet (500 mg total) by mouth 2 (two) times daily for 10 days. 01/14/22 01/24/22 Yes Atiana Levier-Warren, Alda Lea, NP  acetaminophen (TYLENOL) 500 MG tablet Take 500 mg by mouth every 6 (six) hours as needed.    [provider]  EPINEPHrine 0.3 mg/0.3 mL IJ SOAJ injection Inject 0.3 mg into the muscle as needed for anaphylaxis. 03/11/21   Mesner, Corene Cornea, MD  fluticasone (FLONASE) 50 MCG/ACT nasal spray Place 1 spray into both nostrils 2 (two) times daily. 11/25/21   Volney American, PA-C  levofloxacin [facility])  500 MG tablet Take 1 tablet (500 mg total) by mouth daily. 12/19/21   Mesner, Barbara Cower, MD  pantoprazole (PROTONIX) 20 MG tablet Take 1 tablet (20 mg total) by mouth daily. 12/06/21   Domenick Gong, MD  promethazine-dextromethorphan (PROMETHAZINE-DM) 6.25-15 MG/5ML syrup Take 5 mLs by mouth 4 (four) times daily as needed. Patient not taking: Reported on 12/20/2021 12/06/21   Domenick Gong, MD  sertraline (ZOLOFT) 50 MG tablet Take 1 tablet (50 mg total) by mouth daily. 06/10/21   Adline Potter, NP  Spacer/Aero-Holding Deretha Emory (AEROCHAMBER MV) inhaler Use as instructed 12/06/21   Domenick Gong, MD  enalapril (VASOTEC) 5 MG tablet Take 1 tablet (5 mg total) by mouth daily. Patient not taking: Reported on 06/02/2019 05/05/19 11/02/19  Jacklyn Shell, CNM    Family History Family History  Problem Relation Age of Onset   Hypertension  Paternal Grandfather    CAD Paternal Grandmother    Hypertension Mother     Social History Social History   Tobacco Use   Smoking status: Former    Packs/day: 0.50    Types: Cigarettes   Smokeless tobacco: Never   Tobacco comments:    occ  Vaping Use   Vaping Use: Never used  Substance Use Topics   Alcohol use: No   Drug use: No     Allergies   Pineapple   Review of Systems Review of Systems Per HPI  Physical Exam Triage Vital Signs ED Triage Vitals  Enc Vitals Group     BP 01/14/22 1044 124/83     Pulse Rate 01/14/22 1044 (!) 104     Resp 01/14/22 1044 18     Temp 01/14/22 1044 99.1 F (37.3 C)     Temp Source 01/14/22 1044 Oral     SpO2 01/14/22 1044 95 %     Weight --      Height --      Head Circumference --      Peak Flow --      Pain Score 01/14/22 1042 9     Pain Loc --      Pain Edu? --      Excl. in GC? --    No data found.  Updated Vital Signs BP 124/83 (BP Location: Right Arm)   Pulse (!) 104   Temp 99.1 F (37.3 C) (Oral)   Resp 18   LMP  (Within Months) Comment: Reports she did not have menstrual period on 10/23. Reports negative pregnancy test on 01/10/22  SpO2 95%   Visual Acuity Right Eye Distance:   Left Eye Distance:   Bilateral Distance:    Right Eye Near:   Left Eye Near:    Bilateral Near:     Physical Exam Vitals and nursing note reviewed.  Constitutional:      General: She is not in acute distress.    Appearance: She is well-developed.  HENT:     Head: Normocephalic.     Right Ear: Tympanic membrane and ear canal normal.     Left Ear: Tympanic membrane and ear canal normal.     Nose: No congestion or rhinorrhea.     Mouth/Throat:     Mouth: Mucous membranes are moist.     Pharynx: Uvula midline. Pharyngeal swelling and posterior oropharyngeal erythema present. No oropharyngeal exudate.     Tonsils: No tonsillar exudate. 2+ on the right. 2+ on the left.  Eyes:     Conjunctiva/sclera: Conjunctivae normal.      Pupils: Pupils  are equal, round, and reactive to light.  Cardiovascular:     Rate and Rhythm: Regular rhythm. Tachycardia present.     Heart sounds: Normal heart sounds.  Pulmonary:     Effort: Pulmonary effort is normal. No respiratory distress.     Breath sounds: Normal breath sounds. No stridor. No wheezing, rhonchi or rales.  Abdominal:     General: Bowel sounds are normal.     Palpations: Abdomen is soft.     Tenderness: There is no abdominal tenderness.  Musculoskeletal:     Cervical back: Normal range of motion.  Lymphadenopathy:     Cervical: No cervical adenopathy.  Skin:    General: Skin is warm and dry.  Neurological:     General: No focal deficit present.     Mental Status: She is alert and oriented to person, place, and time.  Psychiatric:        Mood and Affect: Mood normal.        Behavior: Behavior normal.      UC Treatments / Results  Labs (all labs ordered are listed, but only abnormal results are displayed) Labs Reviewed  POCT RAPID STREP A (OFFICE) - Abnormal; Notable for the following components:      Result Value   Rapid Strep A Screen Positive (*)    All other components within normal limits    EKG   Radiology No results found.  Procedures Procedures (including critical care time)  Medications Ordered in UC Medications - No data to display  Initial Impression / Assessment and Plan / UC Course  I have reviewed the triage vital signs and the nursing notes.  Pertinent labs & imaging results that were available during my care of the patient were reviewed by me and considered in my medical decision making (see chart for details).  Patient presents for complaints of sore throat that been present for the past 2 days.  Patient's vital signs are stable, exam is reassuring.  There are no signs of a muffled voice, drooling, or difficulty swallowing.  Rapid strep test is positive.  Will treat patient with penicillin 500mg  BID x 10 days.  Supportive  care recommendations were provided to the patient.  Patient verbalizes understanding.  All questions were answered.  Patient is stable for discharge.  Work note was provided. Final Clinical Impressions(s) / UC Diagnoses   Final diagnoses:  Streptococcal sore throat     Discharge Instructions      Test is positive.  I am starting you on an antibiotic for your symptoms. Take medication as prescribed. Increase fluids and allow for plenty of rest. Warm salt water gargles 3-4 times daily while symptoms persist. Recommend a soft diet while symptoms persist to include soup, broth, yogurt, pudding, popsicles, or fruit. Discard your toothbrush after 3 days. Follow-up in this clinic or with your primary care physician if symptoms fail to improve.    ED Prescriptions     Medication Sig Dispense Auth. Provider   penicillin v potassium (VEETID) 500 MG tablet Take 1 tablet (500 mg total) by mouth 2 (two) times daily for 10 days. 20 tablet Avalin Briley-Warren, , NP      PDMP not reviewed this encounter.   Sadie Haber, NP 01/14/22 1128

## 2022-01-16 ENCOUNTER — Ambulatory Visit: Payer: BLUE CROSS/BLUE SHIELD | Admitting: Obstetrics & Gynecology

## 2022-01-27 ENCOUNTER — Ambulatory Visit (INDEPENDENT_AMBULATORY_CARE_PROVIDER_SITE_OTHER): Payer: BLUE CROSS/BLUE SHIELD | Admitting: Family

## 2022-01-27 ENCOUNTER — Encounter: Payer: Self-pay | Admitting: Family

## 2022-01-27 VITALS — BP 110/64 | HR 68 | Temp 97.0°F | Ht 68.0 in | Wt 240.2 lb

## 2022-01-27 DIAGNOSIS — Z0000 Encounter for general adult medical examination without abnormal findings: Secondary | ICD-10-CM | POA: Diagnosis not present

## 2022-01-27 DIAGNOSIS — E559 Vitamin D deficiency, unspecified: Secondary | ICD-10-CM | POA: Diagnosis not present

## 2022-01-27 LAB — VITAMIN D 25 HYDROXY (VIT D DEFICIENCY, FRACTURES): VITD: 22.77 ng/mL — ABNORMAL LOW (ref 30.00–100.00)

## 2022-01-27 LAB — CBC WITH DIFFERENTIAL/PLATELET
Basophils Absolute: 0.1 10*3/uL (ref 0.0–0.1)
Basophils Relative: 0.7 % (ref 0.0–3.0)
Eosinophils Absolute: 0.1 10*3/uL (ref 0.0–0.7)
Eosinophils Relative: 1.2 % (ref 0.0–5.0)
HCT: 37.6 % (ref 36.0–46.0)
Hemoglobin: 12.3 g/dL (ref 12.0–15.0)
Lymphocytes Relative: 18.4 % (ref 12.0–46.0)
Lymphs Abs: 1.5 10*3/uL (ref 0.7–4.0)
MCHC: 32.6 g/dL (ref 30.0–36.0)
MCV: 79.9 fl (ref 78.0–100.0)
Monocytes Absolute: 0.3 10*3/uL (ref 0.1–1.0)
Monocytes Relative: 4.4 % (ref 3.0–12.0)
Neutro Abs: 6 10*3/uL (ref 1.4–7.7)
Neutrophils Relative %: 75.3 % (ref 43.0–77.0)
Platelets: 368 10*3/uL (ref 150.0–400.0)
RBC: 4.7 Mil/uL (ref 3.87–5.11)
RDW: 14.2 % (ref 11.5–15.5)
WBC: 7.9 10*3/uL (ref 4.0–10.5)

## 2022-01-27 LAB — COMPREHENSIVE METABOLIC PANEL
ALT: 14 U/L (ref 0–35)
AST: 12 U/L (ref 0–37)
Albumin: 4.2 g/dL (ref 3.5–5.2)
Alkaline Phosphatase: 57 U/L (ref 39–117)
BUN: 13 mg/dL (ref 6–23)
CO2: 27 mEq/L (ref 19–32)
Calcium: 9.2 mg/dL (ref 8.4–10.5)
Chloride: 103 mEq/L (ref 96–112)
Creatinine, Ser: 0.74 mg/dL (ref 0.40–1.20)
GFR: 110.04 mL/min (ref >60.00)
Glucose, Bld: 95 mg/dL (ref 70–99)
Potassium: 3.8 mEq/L (ref 3.5–5.1)
Sodium: 138 mEq/L (ref 135–145)
Total Bilirubin: 0.3 mg/dL (ref 0.2–1.2)
Total Protein: 7.2 g/dL (ref 6.0–8.3)

## 2022-01-27 NOTE — Progress Notes (Signed)
Phone [phone]  Subjective:   Patient is a 26 y.o. female presenting for annual physical.    Chief Complaint  Patient presents with   Annual Exam    Fasting W/ Labs    See problem oriented charting- ROS- full  review of systems was completed and negative.   The following were reviewed and entered/updated in epic: Past Medical History:  Diagnosis Date   Anxiety    Eczema    Gestational hypertension 04/27/2019   Supervision of normal first pregnancy 10/19/2018     FAMILY TREE  LAB RESULTS  Language English Pap 08/20/16 neg  Initiated care at [facility] GC/CT Initial: neg/neg        36wks: neg/neg  Dating by LMP c/w 9wk U/S    Support person [employer] NT/IT/AFP/MaterniT21:declined    [place]/HgbE neg  Flu vaccine 12/22/18  CF declined  TDaP vaccine 02/16/19  SMA declined  Rhogam N/A Fragile X declined       Anatomy US Normal female Blood Type A/Positive/-- (08/11    Patient Active Problem List   Diagnosis Date Noted   Acute non-recurrent frontal sinusitis 07/22/2021   Gastroesophageal reflux disease with esophagitis without hemorrhage 05/30/2021   Menorrhagia with regular cycle 05/29/2021   Encounter for menstrual regulation 05/29/2021   Pelvic pain 05/14/2021   Acute right-sided low back pain 05/14/2021   Symptoms of urinary tract infection 05/14/2021   Late period 05/14/2021   Right calf pain 05/14/2021   Dysmenorrhea 05/14/2021   Dyspareunia in female 05/14/2021   Strep sore throat 04/10/2021   Influenza 02/14/2021   Annual physical exam 01/25/2021   Pain and swelling of right lower leg 01/25/2021   Obesity (BMI 30-39.9) 01/25/2021   Anxiety 10/19/2018   Past Surgical History:  Procedure Laterality Date   NO PAST SURGERIES      Family History  Problem Relation Age of Onset   Hypertension Paternal Grandfather    CAD Paternal Grandmother    Hypertension Mother     Medications- reviewed and updated Current Outpatient Medications  Medication Sig Dispense Refill    acetaminophen (TYLENOL) 500 MG tablet Take 500 mg by mouth every 6 (six) hours as needed.     EPINEPHrine 0.3 mg/0.3 mL IJ SOAJ injection Inject 0.3 mg into the muscle as needed for anaphylaxis. 2 each 1   fluticasone (FLONASE) 50 MCG/ACT nasal spray Place 1 spray into both nostrils 2 (two) times daily. 16 g 2   levofloxacin (LEVAQUIN) 500 MG tablet Take 1 tablet (500 mg total) by mouth daily. 7 tablet 0   pantoprazole (PROTONIX) 20 MG tablet Take 1 tablet (20 mg total) by mouth daily. 30 tablet 0   promethazine-dextromethorphan (PROMETHAZINE-DM) 6.25-15 MG/5ML syrup Take 5 mLs by mouth 4 (four) times daily as needed. 150 mL 0   sertraline (ZOLOFT) 50 MG tablet Take 1 tablet (50 mg total) by mouth daily. 30 tablet 6   Spacer/Aero-Holding Chambers (AEROCHAMBER MV) inhaler Use as instructed 1 each 1   No current facility-administered medications for this visit.    Allergies-reviewed and updated Allergies  Allergen Reactions   Pineapple Swelling    Social History   Social History Narrative   Not on file    Objective:  BP 110/64 (BP Location: Left Arm, Patient Position: Sitting, Cuff Size: Large)   Pulse 68   Temp (!) 97 F (36.1 C) (Temporal)   Ht 5\' 8"  (1.727 m)   Wt 240 lb 4 oz (109 kg)   LMP 01/20/2022 (Approximate)  SpO2 98%   BMI 36.53 kg/m  Physical Exam Vitals and nursing note reviewed.  Constitutional:      Appearance: Normal appearance. She is obese.  HENT:     Head: Normocephalic.     Right Ear: Tympanic membrane normal.     Left Ear: Tympanic membrane normal.     Nose: Nose normal.     Mouth/Throat:     Mouth: Mucous membranes are moist.  Eyes:     Pupils: Pupils are equal, round, and reactive to light.  Cardiovascular:     Rate and Rhythm: Normal rate and regular rhythm.  Pulmonary:     Effort: Pulmonary effort is normal.     Breath sounds: Normal breath sounds.  Musculoskeletal:        General: Normal range of motion.     Cervical back: Normal range  of motion.  Lymphadenopathy:     Cervical: No cervical adenopathy.  Skin:    General: Skin is warm and dry.  Neurological:     Mental Status: She is alert.  Psychiatric:        Mood and Affect: Mood normal.        Behavior: Behavior normal.      Assessment and Plan   Health Maintenance counseling: 1. Anticipatory guidance: Patient counseled regarding regular dental exams q6 months, eye exams,  avoiding smoking and second hand smoke, limiting alcohol to 1 beverage per day, no illicit drugs.   2. Risk factor reduction:  Advised patient of need for regular exercise and diet rich with fruits and vegetables to reduce risk of heart attack and stroke. Wt Readings from Last 3 Encounters:  01/27/22 240 lb 4 oz (109 kg)  12/20/21 241 lb (109.3 kg)  12/18/21 235 lb (106.6 kg)   3. Immunizations/screenings/ancillary studies Immunization History  Administered Date(s) Administered   Influenza,inj,Quad PF,6+ Mos 12/22/2018   Moderna Sars-Covid-2 Vaccination 09/26/2019   Tdap 02/16/2019   Health Maintenance Due  Topic Date Due   Hepatitis C Screening  Never done   INFLUENZA VACCINE  10/08/2021    4. Cervical cancer screening: due 2024 5. Skin cancer screening- advised regular sunscreen use. Denies worrisome, changing, or new skin lesions.  6. Birth control/STD check: none, condoms 7. Smoking associated screening: non-smoker - 3 weeks cessation 8. Alcohol screening: none  Problem List Items Addressed This Visit       Other   Annual physical exam - Primary   Relevant Orders   Comprehensive metabolic panel   CBC with Differential/Platelet   Other Visit Diagnoses     Vitamin D deficiency       Relevant Orders   Vitamin D (25 hydroxy)       Recommended follow up: No follow-ups on file. Future Appointments  Date Time Provider Department Center  03/26/2022  9:00 AM Pollyann Savoy, MD [facility] None  04/16/2022  9:40 AM Pollyann Savoy, MD [facility] None    Lab/Order  associations: fasting    Dulce Sellar, NP

## 2022-01-29 ENCOUNTER — Telehealth: Payer: Self-pay | Admitting: Family

## 2022-01-29 ENCOUNTER — Encounter: Payer: Self-pay | Admitting: Family

## 2022-01-29 MED ORDER — VITAMIN D3 1.25 MG (50000 UT) PO CAPS: 1.0000 | ORAL_CAPSULE | ORAL | 0 refills | Status: DC

## 2022-01-29 NOTE — Progress Notes (Signed)
Your glucose (sugar), electrolytes, kidney and liver function, blood count are all good.   I did not check your thyroid or cholesterol as those were done not long ago and in normal range. Your Vitamin D level is low, so I have sent over a prescription for you to take just WEEKLY, not daily. This may give you a little more energy after your level is built back up, usually takes  a couple of weeks. Keep up the good work with controlling your diet and continue to try and shoot for 30 minutes of exercise daily!

## 2022-01-29 NOTE — Addendum Note (Signed)
Addended byDulce Sellar on: 01/29/2022 08:18 AM   Modules accepted: Orders

## 2022-01-29 NOTE — Telephone Encounter (Signed)
Caller States: -Received Rx for Vitamin D -Needs clarification on instructions   Caller Requests: -call back

## 2022-01-29 NOTE — Telephone Encounter (Signed)
I called pt and VM box was full. Directions are on the bottle of RX.

## 2022-02-20 ENCOUNTER — Encounter: Payer: Self-pay | Admitting: *Deleted

## 2022-03-16 NOTE — Progress Notes (Deleted)
Office Visit Note  Patient: Anna Andrews             Date of Birth: [birth year]           MRN: [id number]             PCP: Jeanie Sewer, NP Referring: Jeanie Sewer, NP Visit Date: 03/26/2022 Occupation: @GUAROCC$ @  Subjective:  No chief complaint on file.   History of Present Illness: Anna Andrews is a 27 y.o. female ***     Activities of Daily Living:  Patient reports morning stiffness for *** {minute/hour:19697}.   Patient {ACTIONS;DENIES/REPORTS:21021675::"Denies"} nocturnal pain.  Difficulty dressing/grooming: {ACTIONS;DENIES/REPORTS:21021675::"Denies"} Difficulty climbing stairs: {ACTIONS;DENIES/REPORTS:21021675::"Denies"} Difficulty getting out of chair: {ACTIONS;DENIES/REPORTS:21021675::"Denies"} Difficulty using hands for taps, buttons, cutlery, and/or writing: {ACTIONS;DENIES/REPORTS:21021675::"Denies"}  No Rheumatology ROS completed.   PMFS History:  Patient Active Problem List   Diagnosis Date Noted   Acute non-recurrent frontal sinusitis 07/22/2021   Gastroesophageal reflux disease with esophagitis without hemorrhage 05/30/2021   Menorrhagia with regular cycle 05/29/2021   Encounter for menstrual regulation 05/29/2021   Pelvic pain 05/14/2021   Acute right-sided low back pain 05/14/2021   Symptoms of urinary tract infection 05/14/2021   Late period 05/14/2021   Right calf pain 05/14/2021   Dysmenorrhea 05/14/2021   Dyspareunia in female 05/14/2021   Strep sore throat 04/10/2021   Influenza 02/14/2021   Annual physical exam 01/25/2021   Pain and swelling of right lower leg 01/25/2021   Obesity (BMI 30-39.9) 01/25/2021   Anxiety 10/19/2018    Past Medical History:  Diagnosis Date   Anxiety    Eczema    Gestational hypertension 04/27/2019   Supervision of normal first pregnancy 10/19/2018     FAMILY TREE  LAB RESULTS  Language English Pap 08/20/16 neg  Initiated care at [facility] GC/CT Initial: neg/neg        36wks: neg/neg  Dating by LMP c/w 9wk  U/S    Support person [employer] NT/IT/AFP/MaterniT21:declined    [place]/HgbE neg  Flu vaccine 12/22/18  CF declined  TDaP vaccine 02/16/19  SMA declined  Rhogam N/A Fragile X declined       Anatomy US Normal female Blood Type A/Positive/-- (08/11     Family History  Problem Relation Age of Onset   Hypertension Paternal Grandfather    CAD Paternal Grandmother    Hypertension Mother    Past Surgical History:  Procedure Laterality Date   NO PAST SURGERIES     Social History   Social History Narrative   Not on file   Immunization History  Administered Date(s) Administered   Influenza,inj,Quad PF,6+ Mos 12/22/2018   Moderna Sars-Covid-2 Vaccination 09/26/2019   Tdap 02/16/2019     Objective: Vital Signs: There were no vitals taken for this visit.   Physical Exam   Musculoskeletal Exam: ***  CDAI Exam: CDAI Score: -- Patient Global: --; Provider Global: -- Swollen: --; Tender: -- Joint Exam 03/26/2022   No joint exam has been documented for this visit   There is currently no information documented on the homunculus. Go to the Rheumatology activity and complete the homunculus joint exam.  Investigation: No additional findings.  Imaging: No results found.  Recent Labs: Lab Results  Component Value Date   WBC 7.9 01/27/2022   HGB 12.3 01/27/2022   PLT 368.0 01/27/2022   NA 138 01/27/2022   K 3.8 01/27/2022   CL 103 01/27/2022   CO2 27 01/27/2022   GLUCOSE 95 01/27/2022   BUN 13 01/27/2022  CREATININE 0.74 01/27/2022   BILITOT 0.3 01/27/2022   ALKPHOS 57 01/27/2022   AST 12 01/27/2022   ALT 14 01/27/2022   PROT 7.2 01/27/2022   ALBUMIN 4.2 01/27/2022   CALCIUM 9.2 01/27/2022   GFRAA >60 11/02/2019    Speciality Comments: No specialty comments available.  Procedures:  No procedures performed Allergies: Pineapple   Assessment / Plan:     Visit Diagnoses: Polyarthralgia - 04/22/21: CRP WNL, ESR 63, Jo-1 ab-, Anti-CCP-, RF-, ANA negative, C3 171,  dsDNA-, Scl-70-, Ro-,La-, Sm-, RNP-  Gastroesophageal reflux disease with esophagitis without hemorrhage  Dysmenorrhea  Anxiety  Dyspareunia in female  Menorrhagia with regular cycle  Orders: No orders of the defined types were placed in this encounter.  No orders of the defined types were placed in this encounter.   Face-to-face time spent with patient was *** minutes. Greater than 50% of time was spent in counseling and coordination of care.  Follow-Up Instructions: No follow-ups on file.   Ofilia Neas, PA-C  Note - This record has been created using Dragon software.  Chart creation errors have been sought, but may not always  have been located. Such creation errors do not reflect on  the standard of medical care.

## 2022-03-26 ENCOUNTER — Ambulatory Visit: Payer: 59 | Admitting: Rheumatology

## 2022-03-26 DIAGNOSIS — K2100 Gastro-esophageal reflux disease with esophagitis, without bleeding: Secondary | ICD-10-CM

## 2022-03-26 DIAGNOSIS — N941 Unspecified dyspareunia: Secondary | ICD-10-CM

## 2022-03-26 DIAGNOSIS — F419 Anxiety disorder, unspecified: Secondary | ICD-10-CM

## 2022-03-26 DIAGNOSIS — N920 Excessive and frequent menstruation with regular cycle: Secondary | ICD-10-CM

## 2022-03-26 DIAGNOSIS — M2550 Pain in unspecified joint: Secondary | ICD-10-CM

## 2022-03-26 DIAGNOSIS — N946 Dysmenorrhea, unspecified: Secondary | ICD-10-CM

## 2022-04-16 ENCOUNTER — Ambulatory Visit: Payer: 59 | Admitting: Rheumatology

## 2022-05-13 ENCOUNTER — Encounter: Payer: Self-pay | Admitting: Family

## 2022-05-13 ENCOUNTER — Ambulatory Visit (INDEPENDENT_AMBULATORY_CARE_PROVIDER_SITE_OTHER): Payer: 59 | Admitting: Family

## 2022-05-13 VITALS — BP 114/77 | HR 84 | Temp 98.2°F | Ht 68.0 in | Wt 236.8 lb

## 2022-05-13 DIAGNOSIS — J309 Allergic rhinitis, unspecified: Secondary | ICD-10-CM

## 2022-05-13 MED ORDER — TRIAMCINOLONE ACETONIDE 55 MCG/ACT NA AERO: 1.0000 | INHALATION_SPRAY | Freq: Every day | NASAL | 2 refills | Status: DC

## 2022-05-13 NOTE — Progress Notes (Signed)
Patient ID: Anna Andrews, female    DOB: June 02, 1995, 27 y.o.   MRN: [id number]  Chief Complaint  Patient presents with   URI    sx for 3w   mucus    HPI:      URI sx:  Pt states had cold 3 weeks ago, took mucus relief. Pt states when she tries to get mucus up, its stringy. Pt has been taking tylenol and nyquil. Pt states chest pain is around collarbone, has been working out. Pt states she had white tonsil stones come out.      Assessment & Plan:  1. Allergic rhinitis, unspecified seasonality, unspecified trigger - sending Nasacort, advised on use & SE. Advised on using saline nasal spray or Neti pot tid and prior to using Nasacort. Increase water intake to 2L per day and use humidifier overnight.  - triamcinolone (NASACORT) 55 MCG/ACT AERO nasal inhaler; Place 1 spray into the nose daily. Start with 1 spray each side twice a day for 3 days, then reduce to daily.  Dispense: 1 each; Refill: 2   Subjective:    Outpatient Medications Prior to Visit  Medication Sig Dispense Refill   acetaminophen (TYLENOL) 500 MG tablet Take 500 mg by mouth every 6 (six) hours as needed.     Cholecalciferol (VITAMIN D3) 1.25 MG (50000 UT) CAPS Take 1 capsule by mouth once a week. AFTER the last weekly pill, start a daily OTC Vitamin D3 up to 2,000units. 12 capsule 0   EPINEPHrine 0.3 mg/0.3 mL IJ SOAJ injection Inject 0.3 mg into the muscle as needed for anaphylaxis. 2 each 1   pantoprazole (PROTONIX) 20 MG tablet Take 1 tablet (20 mg total) by mouth daily. 30 tablet 0   sertraline (ZOLOFT) 50 MG tablet Take 1 tablet (50 mg total) by mouth daily. 30 tablet 6   fluticasone (FLONASE) 50 MCG/ACT nasal spray Place 1 spray into both nostrils 2 (two) times daily. 16 g 2   levofloxacin (LEVAQUIN) 500 MG tablet Take 1 tablet (500 mg total) by mouth daily. (Patient not taking: Reported on 05/13/2022) 7 tablet 0   promethazine-dextromethorphan (PROMETHAZINE-DM) 6.25-15 MG/5ML syrup Take 5 mLs by mouth 4 (four) times  daily as needed. (Patient not taking: Reported on 05/13/2022) 150 mL 0   Spacer/Aero-Holding Chambers (AEROCHAMBER MV) inhaler Use as instructed (Patient not taking: Reported on 05/13/2022) 1 each 1   No facility-administered medications prior to visit.   Past Medical History:  Diagnosis Date   Anxiety    Eczema    Gestational hypertension 04/27/2019   Supervision of normal first pregnancy 10/19/2018     FAMILY TREE  LAB RESULTS  Language English Pap 08/20/16 neg  Initiated care at [facility] GC/CT Initial: neg/neg        36wks: neg/neg  Dating by LMP c/w 9wk U/S    Support person [employer] NT/IT/AFP/MaterniT21:declined    [place]/HgbE neg  Flu vaccine 12/22/18  CF declined  TDaP vaccine 02/16/19  SMA declined  Rhogam N/A Fragile X declined       Anatomy US Normal female Blood Type A/Positive/-- (08/11    Past Surgical History:  Procedure Laterality Date   NO PAST SURGERIES     Allergies  Allergen Reactions   Pineapple Swelling      Objective:    Physical Exam Vitals and nursing note reviewed.  Constitutional:      Appearance: Normal appearance.  HENT:     Nose: Congestion and rhinorrhea present.  Right Turbinates: Swollen.     Left Turbinates: Swollen.     Right Sinus: No frontal sinus tenderness.     Left Sinus: No frontal sinus tenderness.     Mouth/Throat:     Mouth: Mucous membranes are moist.     Pharynx: No pharyngeal swelling, oropharyngeal exudate, posterior oropharyngeal erythema or uvula swelling.     Tonsils: No tonsillar exudate or tonsillar abscesses. 1+ on the right. 1+ on the left.  Cardiovascular:     Rate and Rhythm: Normal rate and regular rhythm.  Pulmonary:     Effort: Pulmonary effort is normal.     Breath sounds: Normal breath sounds.  Musculoskeletal:        General: Normal range of motion.  Skin:    General: Skin is warm and dry.  Neurological:     Mental Status: She is alert.  Psychiatric:        Mood and Affect: Mood normal.        Behavior: Behavior  normal.    BP 114/77   Pulse 84   Temp 98.2 F (36.8 C) (Temporal)   Ht '5\' 8"'$  (1.727 m)   Wt 236 lb 12.8 oz (107.4 kg)   LMP 04/23/2022 (Exact Date)   SpO2 98%   BMI 36.01 kg/m  Wt Readings from Last 3 Encounters:  05/13/22 236 lb 12.8 oz (107.4 kg)  01/27/22 240 lb 4 oz (109 kg)  12/20/21 241 lb (109.3 kg)       Jeanie Sewer, NP

## 2022-06-04 NOTE — Progress Notes (Deleted)
Office Visit Note  Patient: Anna Andrews             Date of Birth: 03/12/1995           MRN: [id number]             PCP: Jeanie Sewer, NP Referring: Jeanie Sewer, NP Visit Date: 06/05/2022 Occupation: @GUAROCC @  Subjective:  No chief complaint on file.   History of Present Illness: Anna Andrews is a 27 y.o. female here for evaluation of multiple joint pains and elevated sedimentation rate. She had autoimmune workup due to increased pain in multiple areas and peripheral edema last year, had workup following a strep throat infection.***   Labs reviewed 05/2021 ESR 64  04/2021 ANA neg RNP, Sm, SSA, SSB, Scl-70, dsDNA, Jo-1 neg RF neg CCP neg Complement C3 171 ESR 63 CRP 1.7   Activities of Daily Living:  Patient reports morning stiffness for *** {minute/hour:19697}.   Patient {ACTIONS;DENIES/REPORTS:21021675::"Denies"} nocturnal pain.  Difficulty dressing/grooming: {ACTIONS;DENIES/REPORTS:21021675::"Denies"} Difficulty climbing stairs: {ACTIONS;DENIES/REPORTS:21021675::"Denies"} Difficulty getting out of chair: {ACTIONS;DENIES/REPORTS:21021675::"Denies"} Difficulty using hands for taps, buttons, cutlery, and/or writing: {ACTIONS;DENIES/REPORTS:21021675::"Denies"}  No Rheumatology ROS completed.   PMFS History:  Patient Active Problem List   Diagnosis Date Noted   Acute non-recurrent frontal sinusitis 07/22/2021   Gastroesophageal reflux disease with esophagitis without hemorrhage 05/30/2021   Menorrhagia with regular cycle 05/29/2021   Encounter for menstrual regulation 05/29/2021   Pelvic pain 05/14/2021   Acute right-sided low back pain 05/14/2021   Symptoms of urinary tract infection 05/14/2021   Late period 05/14/2021   Right calf pain 05/14/2021   Dysmenorrhea 05/14/2021   Dyspareunia in female 05/14/2021   Strep sore throat 04/10/2021   Influenza 02/14/2021   Annual physical exam 01/25/2021   Pain and swelling of right lower leg 01/25/2021    Obesity (BMI 30-39.9) 01/25/2021   Anxiety 10/19/2018    Past Medical History:  Diagnosis Date   Anxiety    Eczema    Gestational hypertension 04/27/2019   Supervision of normal first pregnancy 10/19/2018     FAMILY TREE  LAB RESULTS  Language English Pap 08/20/16 neg  Initiated care at [facility] GC/CT Initial: neg/neg        36wks: neg/neg  Dating by LMP c/w 9wk U/S    Support person [employer] NT/IT/AFP/MaterniT21:declined    [place]/HgbE neg  Flu vaccine 12/22/18  CF declined  TDaP vaccine 02/16/19  SMA declined  Rhogam N/A Fragile X declined       Anatomy US Normal female Blood Type A/Positive/-- (08/11     Family History  Problem Relation Age of Onset   Hypertension Paternal Grandfather    CAD Paternal Grandmother    Hypertension Mother    Past Surgical History:  Procedure Laterality Date   NO PAST SURGERIES     Social History   Social History Narrative   Not on file   Immunization History  Administered Date(s) Administered   Influenza,inj,Quad PF,6+ Mos 12/22/2018   Moderna Sars-Covid-2 Vaccination 09/26/2019   Tdap 02/16/2019     Objective: Vital Signs: LMP 04/23/2022 (Exact Date)    Physical Exam   Musculoskeletal Exam: ***  CDAI Exam: CDAI Score: -- Patient Global: --; Provider Global: -- Swollen: --; Tender: -- Joint Exam 06/05/2022   No joint exam has been documented for this visit   There is currently no information documented on the homunculus. Go to the Rheumatology activity and complete the homunculus joint exam.  Investigation: No additional findings.  Imaging:  No results found.  Recent Labs: Lab Results  Component Value Date   WBC 7.9 01/27/2022   HGB 12.3 01/27/2022   PLT 368.0 01/27/2022   NA 138 01/27/2022   K 3.8 01/27/2022   CL 103 01/27/2022   CO2 27 01/27/2022   GLUCOSE 95 01/27/2022   BUN 13 01/27/2022   CREATININE 0.74 01/27/2022   BILITOT 0.3 01/27/2022   ALKPHOS 57 01/27/2022   AST 12 01/27/2022   ALT 14 01/27/2022   PROT 7.2  01/27/2022   ALBUMIN 4.2 01/27/2022   CALCIUM 9.2 01/27/2022   GFRAA >60 11/02/2019    Speciality Comments: No specialty comments available.  Procedures:  No procedures performed Allergies: Pineapple   Assessment / Plan:     Visit Diagnoses: No diagnosis found.  Orders: No orders of the defined types were placed in this encounter.  No orders of the defined types were placed in this encounter.   Face-to-face time spent with patient was *** minutes. Greater than 50% of time was spent in counseling and coordination of care.  Follow-Up Instructions: No follow-ups on file.   Collier Salina, MD  Note - This record has been created using [employer].  Chart creation errors have been sought, but may not always  have been located. Such creation errors do not reflect on  the standard of medical care.

## 2022-06-05 ENCOUNTER — Encounter: Payer: 59 | Admitting: Internal Medicine

## 2022-06-18 ENCOUNTER — Encounter: Payer: Self-pay | Admitting: Family

## 2022-06-18 ENCOUNTER — Other Ambulatory Visit: Payer: Self-pay | Admitting: Adult Health

## 2022-06-20 ENCOUNTER — Other Ambulatory Visit: Payer: Self-pay

## 2022-06-20 ENCOUNTER — Encounter: Payer: Self-pay | Admitting: Emergency Medicine

## 2022-06-20 ENCOUNTER — Ambulatory Visit: Payer: 59

## 2022-06-20 ENCOUNTER — Ambulatory Visit
Admission: EM | Admit: 2022-06-20 | Discharge: 2022-06-20 | Disposition: A | Discharge status: Home / Self Care | Payer: 59 | Attending: Nurse Practitioner | Admitting: Nurse Practitioner

## 2022-06-20 DIAGNOSIS — J309 Allergic rhinitis, unspecified: Secondary | ICD-10-CM | POA: Diagnosis not present

## 2022-06-20 DIAGNOSIS — J029 Acute pharyngitis, unspecified: Secondary | ICD-10-CM

## 2022-06-20 LAB — POCT RAPID STREP A (OFFICE): Rapid Strep A Screen: NEGATIVE

## 2022-06-20 MED ORDER — MONTELUKAST SODIUM 10 MG PO TABS: 10.0000 mg | ORAL_TABLET | Freq: Every day | ORAL | 0 refills | Status: DC

## 2022-06-20 MED ORDER — PSEUDOEPH-BROMPHEN-DM 30-2-10 MG/5ML PO SYRP: 5.0000 mL | ORAL_SOLUTION | Freq: Four times a day (QID) | ORAL | 0 refills | Status: DC | PRN

## 2022-06-20 MED ORDER — PREDNISONE 20 MG PO TABS: 40.0000 mg | ORAL_TABLET | Freq: Every day | ORAL | 0 refills | Status: AC

## 2022-06-20 NOTE — ED Triage Notes (Signed)
Pt reports nasal congestion,bilateral ear fullness,intermittent cough for last several weeks. Pt reports has used otc medication, nasal sprays,and netti pot with minimal relief.

## 2022-06-20 NOTE — ED Provider Notes (Signed)
[facility]    CSN: [id number] Arrival date & time: 06/20/22  4098      History   Chief Complaint Chief Complaint  Patient presents with   Nasal Congestion    HPI Anna Andrews is a 27 y.o. female.   The history is provided by the patient.   The patient presents for complaints of nasal congestion, nasal drainage, bilateral ear fullness, cough, and sore throat.  Patient states symptoms have been persistent over the past 2 weeks.  She states that she has felt both hot and cold at times, and then it would "go away".  She denies headache, ear pain, wheezing, shortness of breath, difficulty breathing, abdominal pain, nausea, vomiting, or diarrhea.  Patient reports she has been using Allegra, Flonase, and using a Nettie pot with minimal relief.  Patient endorses history of seasonal allergies.  Past Medical History:  Diagnosis Date   Anxiety    Eczema    Gestational hypertension 04/27/2019   Supervision of normal first pregnancy 10/19/2018     FAMILY TREE  LAB RESULTS  Language English Pap 08/20/16 neg  Initiated care at [facility] GC/CT Initial: neg/neg        36wks: neg/neg  Dating by LMP c/w 9wk U/S    Support person [employer] NT/IT/AFP/MaterniT21:declined    Cotati/HgbE neg  Flu vaccine 12/22/18  CF declined  TDaP vaccine 02/16/19  SMA declined  Rhogam N/A Fragile X declined       Anatomy US Normal female Blood Type A/Positive/-- (08/11     Patient Active Problem List   Diagnosis Date Noted   Acute non-recurrent frontal sinusitis 07/22/2021   Gastroesophageal reflux disease with esophagitis without hemorrhage 05/30/2021   Menorrhagia with regular cycle 05/29/2021   Encounter for menstrual regulation 05/29/2021   Pelvic pain 05/14/2021   Acute right-sided low back pain 05/14/2021   Symptoms of urinary tract infection 05/14/2021   Late period 05/14/2021   Right calf pain 05/14/2021   Dysmenorrhea 05/14/2021   Dyspareunia in female 05/14/2021   Strep sore throat 04/10/2021    Influenza 02/14/2021   Annual physical exam 01/25/2021   Pain and swelling of right lower leg 01/25/2021   Obesity (BMI 30-39.9) 01/25/2021   Anxiety 10/19/2018    Past Surgical History:  Procedure Laterality Date   NO PAST SURGERIES      OB History     Gravida  1   Para  1   Term  1   Preterm  0   AB  0   Living  1      SAB  0   IAB  0   Ectopic  0   Multiple  0   Live Births  1            Home Medications    Prior to Admission medications   Medication Sig Start Date End Date Taking? Authorizing Provider  brompheniramine-pseudoephedrine-DM 30-2-10 MG/5ML syrup Take 5 mLs by mouth 4 (four) times daily as needed. 06/20/22  Yes Helon Wisinski-Warren, Sadie Haber, NP  montelukast (SINGULAIR) 10 MG tablet Take 1 tablet (10 mg total) by mouth at bedtime. 06/20/22  Yes Javier Gell-Warren, Sadie Haber, NP  predniSONE (DELTASONE) 20 MG tablet Take 2 tablets (40 mg total) by mouth daily with breakfast for 5 days. 06/20/22 06/25/22 Yes Toshie Demelo-Warren, Sadie Haber, NP  acetaminophen (TYLENOL) 500 MG tablet Take 500 mg by mouth every 6 (six) hours as needed.    [provider]  Cholecalciferol (VITAMIN D3) 1.25  MG (50000 UT) CAPS Take 1 capsule by mouth once a week. AFTER the last weekly pill, start a daily OTC Vitamin D3 up to 2,000units. 01/29/22   Dulce Sellar, NP  EPINEPHrine 0.3 mg/0.3 mL IJ SOAJ injection Inject 0.3 mg into the muscle as needed for anaphylaxis. 03/11/21   Mesner, Barbara Cower, MD  pantoprazole (PROTONIX) 20 MG tablet Take 1 tablet (20 mg total) by mouth daily. 12/06/21   Domenick Gong, MD  sertraline (ZOLOFT) 50 MG tablet TAKE ONE TABLET (50MG  TOTAL) BY MOUTH DAILY 06/18/22   Cyril Mourning A, NP  triamcinolone (NASACORT) 55 MCG/ACT AERO nasal inhaler Place 1 spray into the nose daily. Start with 1 spray each side twice a day for 3 days, then reduce to daily. 05/13/22   Dulce Sellar, NP  enalapril (VASOTEC) 5 MG tablet Take 1 tablet (5 mg total) by mouth  daily. Patient not taking: Reported on 06/02/2019 05/05/19 11/02/19  Jacklyn Shell, CNM    Family History Family History  Problem Relation Age of Onset   Hypertension Paternal Grandfather    CAD Paternal Grandmother    Hypertension Mother     Social History Social History   Tobacco Use   Smoking status: Former    Packs/day: .5    Types: Cigarettes   Smokeless tobacco: Never   Tobacco comments:    occ  Vaping Use   Vaping Use: Never used  Substance Use Topics   Alcohol use: No   Drug use: No     Allergies   Pineapple   Review of Systems Review of Systems Per HPI  Physical Exam Triage Vital Signs ED Triage Vitals  Enc Vitals Group     BP 06/20/22 0820 125/78     Pulse Rate 06/20/22 0820 85     Resp 06/20/22 0820 20     Temp 06/20/22 0820 98.2 F (36.8 C)     Temp Source 06/20/22 0820 Oral     SpO2 06/20/22 0820 97 %     Weight --      Height --      Head Circumference --      Peak Flow --      Pain Score 06/20/22 0818 6     Pain Loc --      Pain Edu? --      Excl. in GC? --    No data found.  Updated Vital Signs BP 125/78 (BP Location: Right Arm)   Pulse 85   Temp 98.2 F (36.8 C) (Oral)   Resp 20   LMP 05/23/2022 (Approximate)   SpO2 97%   Visual Acuity Right Eye Distance:   Left Eye Distance:   Bilateral Distance:    Right Eye Near:   Left Eye Near:    Bilateral Near:     Physical Exam Vitals and nursing note reviewed.  Constitutional:      General: She is not in acute distress.    Appearance: Normal appearance. She is well-developed.  HENT:     Head: Normocephalic and atraumatic.     Right Ear: Tympanic membrane, ear canal and external ear normal.     Left Ear: Ear canal and external ear normal.     Nose: Congestion present. No rhinorrhea.     Right Turbinates: Enlarged and swollen.     Left Turbinates: Enlarged and swollen.     Right Sinus: No maxillary sinus tenderness or frontal sinus tenderness.     Left Sinus:  No maxillary sinus tenderness or frontal  sinus tenderness.     Mouth/Throat:     Mouth: Mucous membranes are moist.     Pharynx: Posterior oropharyngeal erythema present.     Comments: Cobblestoning present on posterior oropharynx Eyes:     Extraocular Movements: Extraocular movements intact.     Conjunctiva/sclera: Conjunctivae normal.     Pupils: Pupils are equal, round, and reactive to light.  Neck:     Thyroid: No thyromegaly.     Trachea: No tracheal deviation.  Cardiovascular:     Rate and Rhythm: Normal rate and regular rhythm.     Pulses: Normal pulses.     Heart sounds: Normal heart sounds.  Pulmonary:     Effort: Pulmonary effort is normal.     Breath sounds: Normal breath sounds.  Abdominal:     General: Bowel sounds are normal. There is no distension.     Palpations: Abdomen is soft.     Tenderness: There is no abdominal tenderness.  Musculoskeletal:     Cervical back: Normal range of motion.  Skin:    General: Skin is warm and dry.  Neurological:     General: No focal deficit present.     Mental Status: She is alert and oriented to person, place, and time.  Psychiatric:        Mood and Affect: Mood normal.        Behavior: Behavior normal.        Thought Content: Thought content normal.        Judgment: Judgment normal.      UC Treatments / Results  Labs (all labs ordered are listed, but only abnormal results are displayed) Labs Reviewed  CULTURE, GROUP A STREP [facility])  POCT RAPID STREP A (OFFICE)    EKG   Radiology No results found.  Procedures Procedures (including critical care time)  Medications Ordered in UC Medications - No data to display  Initial Impression / Assessment and Plan / UC Course  I have reviewed the triage vital signs and the nursing notes.  Pertinent labs & imaging results that were available during my care of the patient were reviewed by me and considered in my medical decision making (see chart for details).  The  patient is well-appearing, she is in no acute distress, vital signs are stable.  Rapid strep test was negative, throat culture is pending.  Suspect symptoms are consistent with allergic rhinitis.  Will treat patient with prednisone 40 mg to help with chronic allergic rhinitis, Bromfed-DM to help with cough and nasal congestion, and Singulair 10 mg daily for continued allergy symptoms.  Patient was advised to continue her current allergy regimen to include Allegra and fluticasone.  Supportive care recommendations were provided and discussed with the patient to include increasing fluids, allowing for plenty of rest, warm salt water gargles, and use of a humidifier, cough persist.  Patient was given strict follow-up precautions.  Patient is in agreement with this plan of care and verbalizes understanding.  All questions were answered.  Patient stable for discharge.   Final Clinical Impressions(s) / UC Diagnoses   Final diagnoses:  Sore throat  Allergic rhinitis, unspecified seasonality, unspecified trigger     Discharge Instructions      The rapid strep test is negative.  A throat culture is pending.  You will be contacted if the culture results are positive. Take medication as prescribed.  When she completes the cough medicine, if you are having persistent drainage, recommend using over the counter Sudafed as directed. Allow  for plenty of rest. Recommend continuing your current allergy medications time. Warm salt water gargles 3-4 times daily while throat symptoms persist. Make sure you are drinking at least 8-10 8 ounce glasses of water also comes persist. Recommend using a humidifier in your bedroom at nighttime during sleep and sleeping elevated on pillows while cough symptoms persist. If you develop new symptoms such as fever, chills, wheezing, difficulty breathing, or other concerns, clinic or with your primary care physician for further evaluation. Follow-up as needed.     ED  Prescriptions     Medication Sig Dispense Auth. Provider   predniSONE (DELTASONE) 20 MG tablet Take 2 tablets (40 mg total) by mouth daily with breakfast for 5 days. 10 tablet Larraine Argo-Warren, Sadie Haber, NP   brompheniramine-pseudoephedrine-DM 30-2-10 MG/5ML syrup Take 5 mLs by mouth 4 (four) times daily as needed. 140 mL Marshal Schrecengost-Warren, Sadie Haber, NP   montelukast (SINGULAIR) 10 MG tablet Take 1 tablet (10 mg total) by mouth at bedtime. 30 tablet Nishan Ovens-Warren, Sadie Haber, NP      PDMP not reviewed this encounter.   Abran Cantor, NP 06/20/22 1013

## 2022-06-20 NOTE — Discharge Instructions (Addendum)
The rapid strep test is negative.  A throat culture is pending.  You will be contacted if the culture results are positive. Take medication as prescribed.  When she completes the cough medicine, if you are having persistent drainage, recommend using over the counter Sudafed as directed. Allow for plenty of rest. Recommend continuing your current allergy medications time. Warm salt water gargles 3-4 times daily while throat symptoms persist. Make sure you are drinking at least 8-10 8 ounce glasses of water also comes persist. Recommend using a humidifier in your bedroom at nighttime during sleep and sleeping elevated on pillows while cough symptoms persist. If you develop new symptoms such as fever, chills, wheezing, difficulty breathing, or other concerns, clinic or with your primary care physician for further evaluation. Follow-up as needed.

## 2022-06-21 LAB — CULTURE, GROUP A STREP (THRC)

## 2022-06-23 LAB — CULTURE, GROUP A STREP (THRC)

## 2022-09-01 ENCOUNTER — Ambulatory Visit (INDEPENDENT_AMBULATORY_CARE_PROVIDER_SITE_OTHER): Payer: 59 | Admitting: Family

## 2022-09-01 ENCOUNTER — Other Ambulatory Visit (HOSPITAL_COMMUNITY)
Admission: RE | Admit: 2022-09-01 | Discharge: 2022-09-01 | Disposition: A | Discharge status: Home / Self Care | Payer: 59 | Source: Ambulatory Visit | Attending: Family | Admitting: Family

## 2022-09-01 ENCOUNTER — Encounter: Payer: Self-pay | Admitting: Family

## 2022-09-01 ENCOUNTER — Other Ambulatory Visit (INDEPENDENT_AMBULATORY_CARE_PROVIDER_SITE_OTHER): Payer: 59 | Admitting: *Deleted

## 2022-09-01 VITALS — BP 116/79 | HR 94 | Temp 96.0°F | Ht 68.0 in | Wt 232.2 lb

## 2022-09-01 DIAGNOSIS — R109 Unspecified abdominal pain: Secondary | ICD-10-CM

## 2022-09-01 DIAGNOSIS — E669 Obesity, unspecified: Secondary | ICD-10-CM

## 2022-09-01 DIAGNOSIS — R8290 Unspecified abnormal findings in urine: Secondary | ICD-10-CM

## 2022-09-01 DIAGNOSIS — E786 Lipoprotein deficiency: Secondary | ICD-10-CM | POA: Diagnosis not present

## 2022-09-01 DIAGNOSIS — Z113 Encounter for screening for infections with a predominantly sexual mode of transmission: Secondary | ICD-10-CM | POA: Diagnosis not present

## 2022-09-01 DIAGNOSIS — Z1159 Encounter for screening for other viral diseases: Secondary | ICD-10-CM

## 2022-09-01 DIAGNOSIS — Z0000 Encounter for general adult medical examination without abnormal findings: Secondary | ICD-10-CM | POA: Diagnosis not present

## 2022-09-01 DIAGNOSIS — R10A0 Flank pain, unspecified side: Secondary | ICD-10-CM

## 2022-09-01 DIAGNOSIS — Z833 Family history of diabetes mellitus: Secondary | ICD-10-CM

## 2022-09-01 DIAGNOSIS — Z01419 Encounter for gynecological examination (general) (routine) without abnormal findings: Secondary | ICD-10-CM | POA: Insufficient documentation

## 2022-09-01 LAB — POCT URINALYSIS DIPSTICK
Blood, UA: NEGATIVE
Glucose, UA: NEGATIVE
Ketones, UA: NEGATIVE
Leukocytes, UA: NEGATIVE
Nitrite, UA: NEGATIVE
Protein, UA: NEGATIVE

## 2022-09-01 LAB — CBC WITH DIFFERENTIAL/PLATELET
Basophils Absolute: 0.1 10*3/uL (ref 0.0–0.1)
Basophils Relative: 0.7 % (ref 0.0–3.0)
Eosinophils Absolute: 0.1 10*3/uL (ref 0.0–0.7)
Eosinophils Relative: 1 % (ref 0.0–5.0)
HCT: 40.6 % (ref 36.0–46.0)
Hemoglobin: 12.8 g/dL (ref 12.0–15.0)
Lymphocytes Relative: 18.6 % (ref 12.0–46.0)
Lymphs Abs: 1.3 10*3/uL (ref 0.7–4.0)
MCHC: 31.5 g/dL (ref 30.0–36.0)
MCV: 82 fl (ref 78.0–100.0)
Monocytes Absolute: 0.3 10*3/uL (ref 0.1–1.0)
Monocytes Relative: 4.3 % (ref 3.0–12.0)
Neutro Abs: 5.4 10*3/uL (ref 1.4–7.7)
Neutrophils Relative %: 75.4 % (ref 43.0–77.0)
Platelets: 335 10*3/uL (ref 150.0–400.0)
RBC: 4.95 Mil/uL (ref 3.87–5.11)
RDW: 14.4 % (ref 11.5–15.5)
WBC: 7.1 10*3/uL (ref 4.0–10.5)

## 2022-09-01 LAB — LDL CHOLESTEROL, DIRECT: Direct LDL: 100 mg/dL

## 2022-09-01 LAB — COMPREHENSIVE METABOLIC PANEL
ALT: 12 U/L (ref 0–35)
AST: 13 U/L (ref 0–37)
Albumin: 4.5 g/dL (ref 3.5–5.2)
Alkaline Phosphatase: 56 U/L (ref 39–117)
BUN: 9 mg/dL (ref 6–23)
CO2: 27 mEq/L (ref 19–32)
Calcium: 9.7 mg/dL (ref 8.4–10.5)
Chloride: 101 mEq/L (ref 96–112)
Creatinine, Ser: 0.68 mg/dL (ref 0.40–1.20)
GFR: 117.96 mL/min (ref >60.00)
Glucose, Bld: 74 mg/dL (ref 70–99)
Potassium: 3.9 mEq/L (ref 3.5–5.1)
Sodium: 135 mEq/L (ref 135–145)
Total Bilirubin: 0.5 mg/dL (ref 0.2–1.2)
Total Protein: 7.9 g/dL (ref 6.0–8.3)

## 2022-09-01 LAB — LIPID PANEL
Cholesterol: 174 mg/dL (ref 0–200)
HDL: 40.2 mg/dL (ref >39.00)
NonHDL: 133.66
Total CHOL/HDL Ratio: 4
Triglycerides: 208 mg/dL — ABNORMAL HIGH (ref 0.0–149.0)
VLDL: 41.6 mg/dL — ABNORMAL HIGH (ref 0.0–40.0)

## 2022-09-01 LAB — VITAMIN D 25 HYDROXY (VIT D DEFICIENCY, FRACTURES): VITD: 25.73 ng/mL — ABNORMAL LOW (ref 30.00–100.00)

## 2022-09-01 LAB — HEMOGLOBIN A1C: Hgb A1c MFr Bld: 5.5 % (ref 4.6–6.5)

## 2022-09-01 NOTE — Progress Notes (Signed)
Phone [phone]  Subjective:   Patient is a 27 y.o. female presenting for annual physical.    Chief Complaint  Patient presents with   Annual Exam    Fasting w/labs    Gynecologic Exam    See problem oriented charting- ROS- full  review of systems was completed and negative.   The following were reviewed and entered/updated in epic: Past Medical History:  Diagnosis Date   Acute non-recurrent frontal sinusitis 07/22/2021   Acute right-sided low back pain 05/14/2021   Anxiety    Eczema    Encounter for menstrual regulation 05/29/2021   Gestational hypertension 04/27/2019   Influenza 02/14/2021   Late period 05/14/2021   Pelvic pain 05/14/2021   Right calf pain 05/14/2021   Strep sore throat 04/10/2021   Supervision of normal first pregnancy 10/19/2018     FAMILY TREE  LAB RESULTS  Language English Pap 08/20/16 neg  Initiated care at [facility] GC/CT Initial: neg/neg        36wks: neg/neg  Dating by LMP c/w 9wk U/S    Support person [employer] NT/IT/AFP/MaterniT21:declined    Moodus/HgbE neg  Flu vaccine 12/22/18  CF declined  TDaP vaccine 02/16/19  SMA declined  Rhogam N/A Fragile X declined       Anatomy US Normal female Blood Type A/Positive/-- (08/11    Symptoms of urinary tract infection 05/14/2021   Patient Active Problem List   Diagnosis Date Noted   Gastroesophageal reflux disease with esophagitis without hemorrhage 05/30/2021   Menorrhagia with regular cycle 05/29/2021   Dysmenorrhea 05/14/2021   Dyspareunia in female 05/14/2021   Annual physical exam 01/25/2021   Pain and swelling of right lower leg 01/25/2021   Obesity (BMI 30-39.9) 01/25/2021   Anxiety 10/19/2018   Past Surgical History:  Procedure Laterality Date   NO PAST SURGERIES      Family History  Problem Relation Age of Onset   Hypertension Paternal Grandfather    CAD Paternal Grandmother    Hypertension Mother     Medications- reviewed and updated Current Outpatient Medications  Medication  Sig Dispense Refill   acetaminophen (TYLENOL) 500 MG tablet Take 500 mg by mouth every 6 (six) hours as needed.     Cholecalciferol (VITAMIN D3) 1.25 MG (50000 UT) CAPS Take 1 capsule by mouth once a week. AFTER the last weekly pill, start a daily OTC Vitamin D3 up to 2,000units. 12 capsule 0   EPINEPHrine 0.3 mg/0.3 mL IJ SOAJ injection Inject 0.3 mg into the muscle as needed for anaphylaxis. 2 each 1   montelukast (SINGULAIR) 10 MG tablet Take 1 tablet (10 mg total) by mouth at bedtime. 30 tablet 0   sertraline (ZOLOFT) 50 MG tablet TAKE ONE TABLET (50MG  TOTAL) BY MOUTH DAILY 30 tablet 3   triamcinolone (NASACORT) 55 MCG/ACT AERO nasal inhaler Place 1 spray into the nose daily. Start with 1 spray each side twice a day for 3 days, then reduce to daily. 1 each 2   No current facility-administered medications for this visit.    Allergies-reviewed and updated Allergies  Allergen Reactions   Pineapple Swelling    Social History   Social History Narrative   Not on file    Objective:  BP 116/79   Pulse 94   Temp (!) 96 F (35.6 C) (Temporal)   Ht 5\' 8"  (1.727 m)   Wt 232 lb 3.2 oz (105.3 kg)   LMP 08/22/2022 (Exact Date)   SpO2 99%   BMI 35.31 kg/m  Physical Exam Vitals and nursing note reviewed.  Constitutional:      Appearance: Normal appearance.  HENT:     Head: Normocephalic.     Right Ear: Tympanic membrane normal.     Left Ear: Tympanic membrane normal.     Nose: Nose normal.     Mouth/Throat:     Mouth: Mucous membranes are moist.  Eyes:     Pupils: Pupils are equal, round, and reactive to light.  Cardiovascular:     Rate and Rhythm: Normal rate and regular rhythm.  Pulmonary:     Effort: Pulmonary effort is normal.     Breath sounds: Normal breath sounds.  Musculoskeletal:        General: Normal range of motion.     Cervical back: Normal range of motion.  Lymphadenopathy:     Cervical: No cervical adenopathy.  Skin:    General: Skin is warm and dry.   Neurological:     Mental Status: She is alert.  Psychiatric:        Mood and Affect: Mood normal.        Behavior: Behavior normal.      Assessment and Plan   Health Maintenance counseling: 1. Anticipatory guidance: Patient counseled regarding regular dental exams q6 months, eye exams,  avoiding smoking and second hand smoke, limiting alcohol to 1 beverage per day, no illicit drugs.   2. Risk factor reduction:  Advised patient of need for regular exercise and diet rich with fruits and vegetables to reduce risk of heart attack and stroke. Wt Readings from Last 3 Encounters:  09/01/22 232 lb 3.2 oz (105.3 kg)  05/13/22 236 lb 12.8 oz (107.4 kg)  01/27/22 240 lb 4 oz (109 kg)   3. Immunizations/screenings/ancillary studies Immunization History  Administered Date(s) Administered   Influenza,inj,Quad PF,6+ Mos 12/22/2018   Moderna Sars-Covid-2 Vaccination 09/26/2019   Tdap 02/16/2019   Health Maintenance Due  Topic Date Due   Hepatitis C Screening  Never done   PAP-Cervical Cytology Screening  06/02/2022   PAP SMEAR-Modifier  06/02/2022    4. Cervical cancer screening:  performing today. 5. Skin cancer screening- advised regular sunscreen use. Denies worrisome, changing, or new skin lesions.  6. Birth control/STD check: none, condoms occ. 7. Smoking associated screening: non- smoker 8. Alcohol screening: rare  Annual physical exam -     VITAMIN D 25 Hydroxy (Vit-D Deficiency, Fractures) -     CBC with Differential/Platelet -     Comprehensive metabolic panel -     Cytology - PAP  Low HDL (under 40) -     Lipid panel  Need for hepatitis C screening test -     Hepatitis C antibody  Family history of diabetes mellitus in father -     Hemoglobin A1c  Screen for STD (sexually transmitted disease) -     Cytology - PAP  Obesity (BMI 30-39.9) -     Lipid panel -     Hemoglobin A1c   Recommended follow up:  No follow-ups on file. Future Appointments  Date Time  Provider Department Center  10/16/2022  9:50 AM Adline Potter, NP [facility]    Lab/Order associations: fasting    Dulce Sellar, NP

## 2022-09-01 NOTE — Patient Instructions (Addendum)
It was very nice to see you today!   I will review your lab results via MyChart in a few days.   Have a great summer!    PLEASE NOTE:  If you had any lab tests please let us know if you have not heard back within a few days. You may see your results on MyChart before we have a chance to review them but we will give you a call once they are reviewed by us. If we ordered any referrals today, please let us know if you have not heard from their office within the next week.    

## 2022-09-01 NOTE — Progress Notes (Signed)
   NURSE VISIT- UTI SYMPTOMS   SUBJECTIVE:  Anna Andrews is a 27 y.o. [id number] female here for UTI symptoms. She is a GYN patient. She reports  urine odor and flank pain .  OBJECTIVE:  There were no vitals taken for this visit.  Appears well, in no apparent distress  No results found for this or any previous visit (from the past 24 hour(s)).  ASSESSMENT: GYN patient with UTI symptoms and negative nitrites  PLAN: Note routed to Anna Andrews, AGNP   Rx sent by provider today: No Urine culture sent Call or return to clinic prn if these symptoms worsen or fail to improve as anticipated. Follow-up: as scheduled   Anna Andrews  09/01/2022 11:31 AM

## 2022-09-02 ENCOUNTER — Encounter: Payer: Self-pay | Admitting: Family

## 2022-09-02 DIAGNOSIS — E559 Vitamin D deficiency, unspecified: Secondary | ICD-10-CM

## 2022-09-02 LAB — URINALYSIS
Bilirubin, UA: NEGATIVE
Glucose, UA: NEGATIVE
Ketones, UA: NEGATIVE
Leukocytes,UA: NEGATIVE
Nitrite, UA: NEGATIVE
Protein,UA: NEGATIVE
RBC, UA: NEGATIVE
Specific Gravity, UA: 1.022 (ref 1.005–1.030)
Urobilinogen, Ur: 0.2 mg/dL (ref 0.2–1.0)
pH, UA: 6.5 (ref 5.0–7.5)

## 2022-09-02 LAB — HEPATITIS C ANTIBODY: Hepatitis C Ab: NONREACTIVE

## 2022-09-03 LAB — CYTOLOGY - PAP
Adequacy: ABSENT
Chlamydia: NEGATIVE
Comment: NEGATIVE
Comment: NEGATIVE
Comment: NORMAL
Diagnosis: NEGATIVE
Neisseria Gonorrhea: NEGATIVE
Trichomonas: NEGATIVE

## 2022-09-03 LAB — URINE CULTURE

## 2022-09-05 LAB — URINE CULTURE

## 2022-09-30 MED ORDER — VITAMIN D3 125 MCG (5000 UT) PO CAPS: 5000.0000 [IU] | ORAL_CAPSULE | Freq: Every day | ORAL | 3 refills | Status: DC

## 2022-10-16 ENCOUNTER — Ambulatory Visit: Payer: 59 | Admitting: Adult Health

## 2022-11-11 IMAGING — DX DG CHEST 2V
2 series · 2 of 2 positions shown · non-contrast
Comparison: 10/05/2020 and earlier.

CLINICAL DATA: [age]-year-old female with cough. Chest
congestion and ear pain for 2 weeks.

EXAM:
CHEST - 2 VIEW

[chest pa]
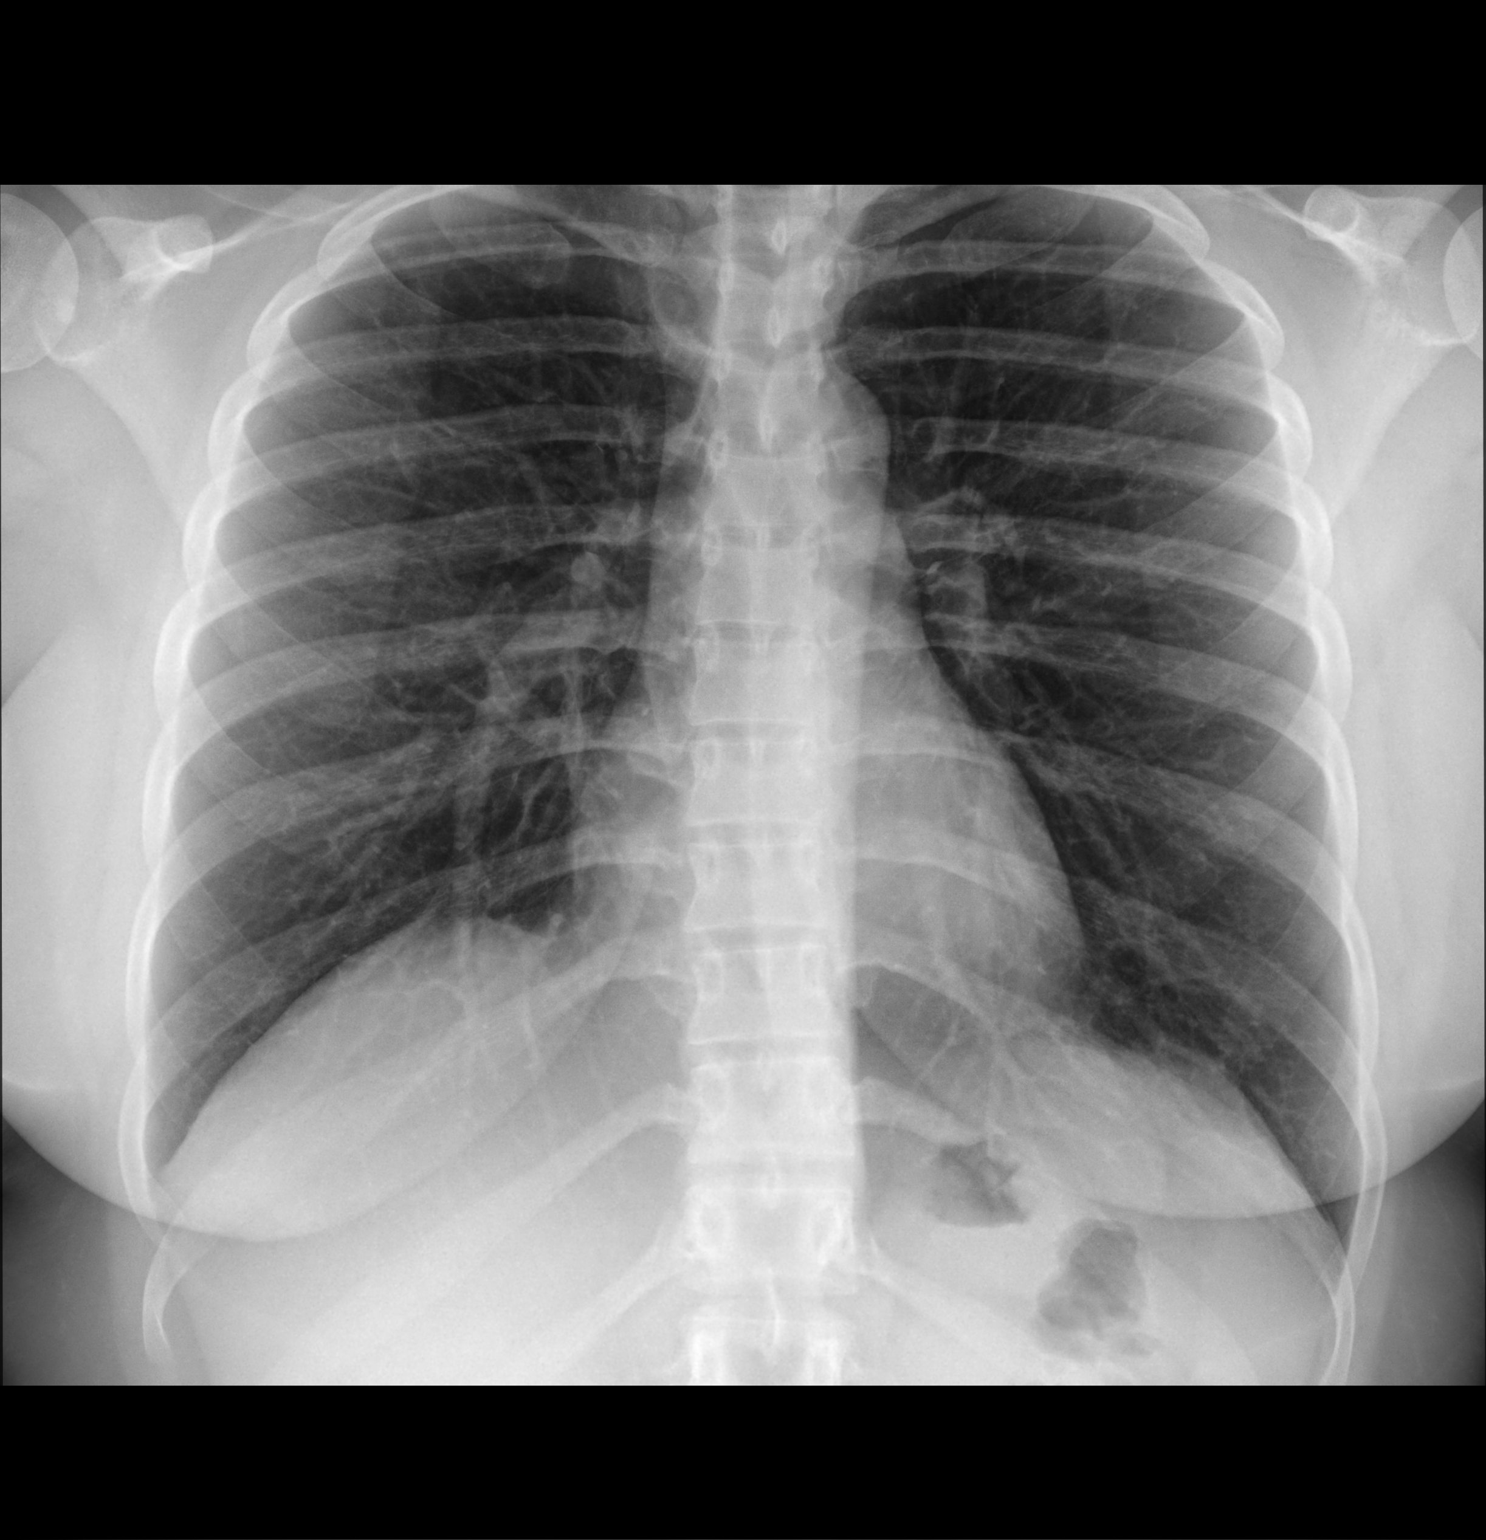

[chest lat]
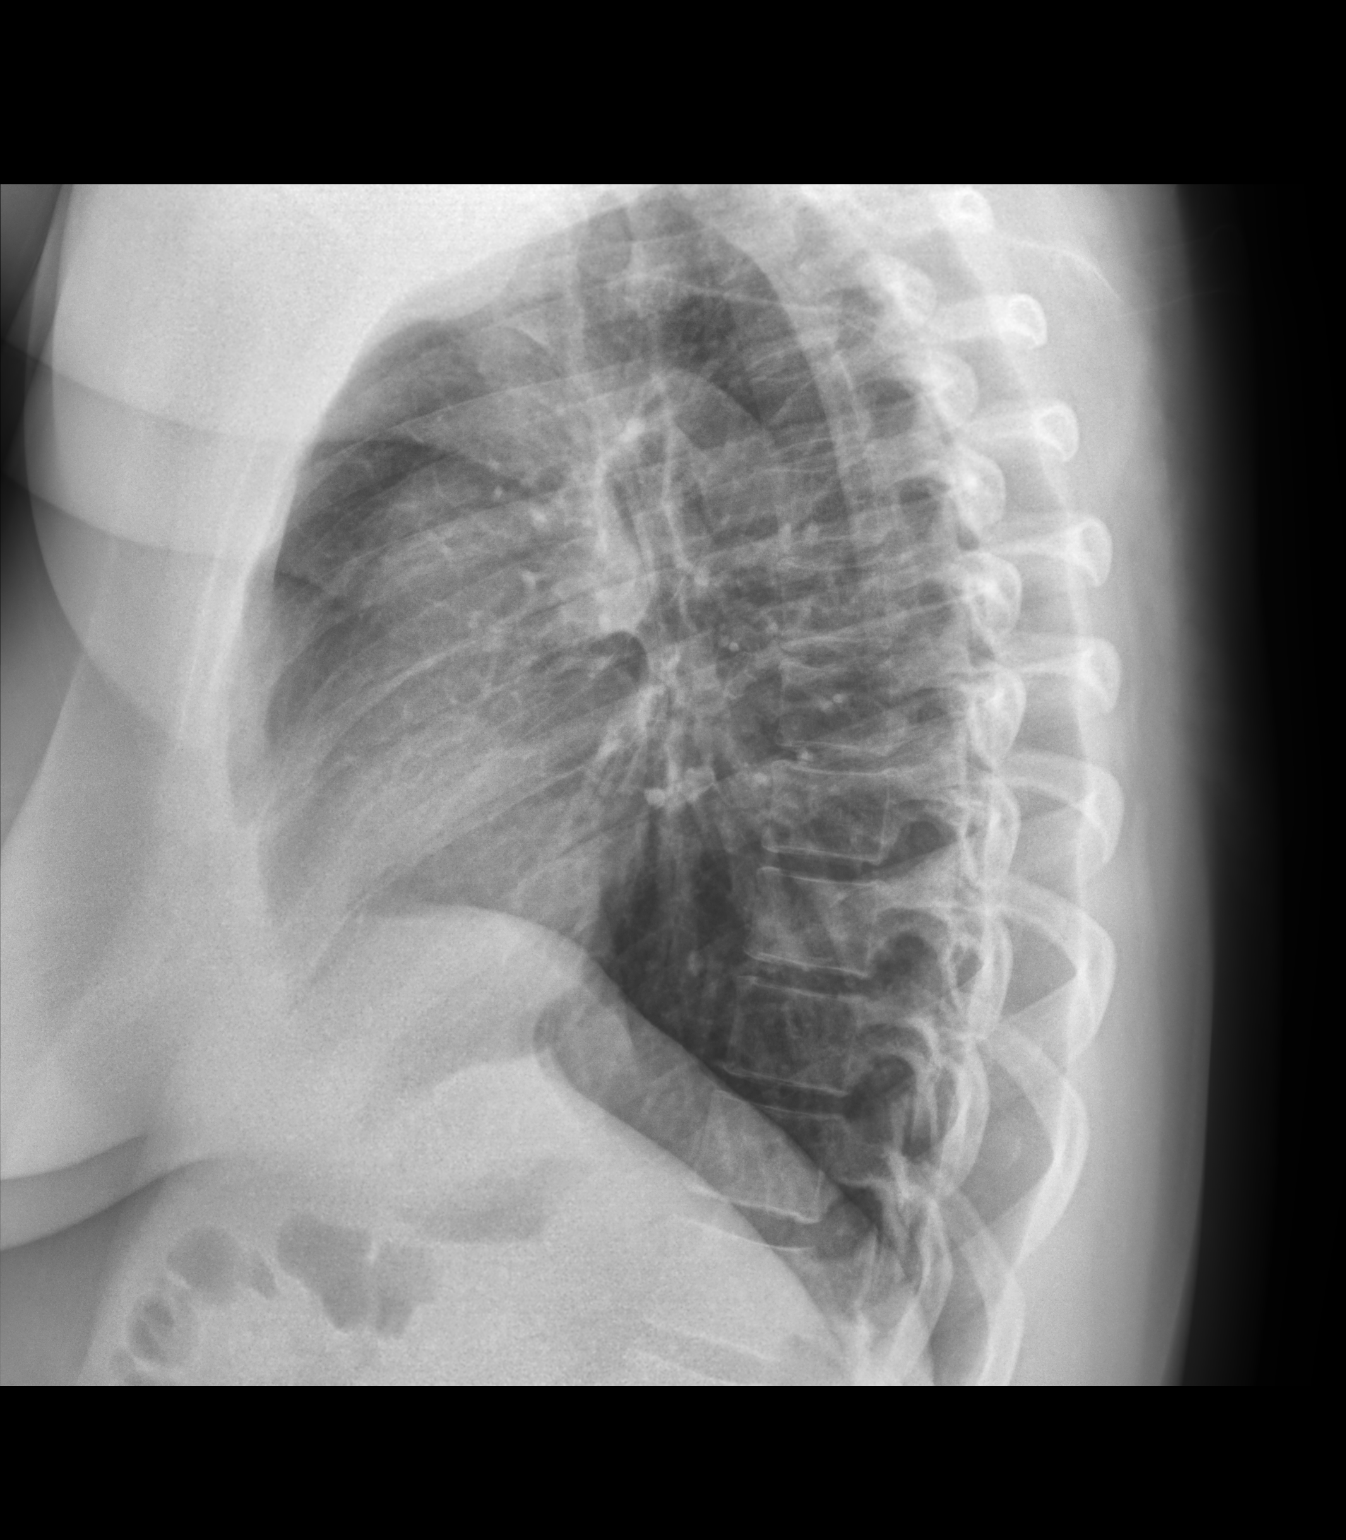

[2 of 2 positions shown; findings below may reference images not displayed]

FINDINGS: Lung volumes and mediastinal contours remain normal. Visualized
tracheal air column is within normal limits. Both lungs remain
clear. No pneumothorax or pleural effusion. No osseous abnormality
identified. Negative visible bowel gas.
IMPRESSION: Negative.  No cardiopulmonary abnormality.

## 2022-11-12 ENCOUNTER — Ambulatory Visit: Payer: 59 | Admitting: Obstetrics and Gynecology

## 2022-12-03 ENCOUNTER — Emergency Department (HOSPITAL_COMMUNITY): Payer: 59

## 2022-12-03 ENCOUNTER — Ambulatory Visit
Admission: RE | Admit: 2022-12-03 | Discharge: 2022-12-03 | Disposition: A | Discharge status: Home / Self Care | Payer: 59 | Source: Ambulatory Visit | Attending: Family Medicine | Admitting: Family Medicine

## 2022-12-03 ENCOUNTER — Emergency Department (HOSPITAL_COMMUNITY)
Admission: EM | Admit: 2022-12-03 | Discharge: 2022-12-04 | Disposition: A | Discharge status: Home / Self Care | Payer: 59 | Attending: Emergency Medicine | Admitting: Emergency Medicine

## 2022-12-03 VITALS — BP 111/78 | HR 91 | Temp 98.7°F | Resp 18

## 2022-12-03 DIAGNOSIS — R079 Chest pain, unspecified: Secondary | ICD-10-CM | POA: Diagnosis not present

## 2022-12-03 DIAGNOSIS — J069 Acute upper respiratory infection, unspecified: Secondary | ICD-10-CM | POA: Diagnosis not present

## 2022-12-03 DIAGNOSIS — J22 Unspecified acute lower respiratory infection: Secondary | ICD-10-CM

## 2022-12-03 DIAGNOSIS — R0789 Other chest pain: Secondary | ICD-10-CM | POA: Diagnosis not present

## 2022-12-03 DIAGNOSIS — R059 Cough, unspecified: Secondary | ICD-10-CM | POA: Diagnosis not present

## 2022-12-03 LAB — CBC
HCT: 37.6 % (ref 36.0–46.0)
Hemoglobin: 12.3 g/dL (ref 12.0–15.0)
MCH: 27 pg (ref 26.0–34.0)
MCHC: 32.7 g/dL (ref 30.0–36.0)
MCV: 82.6 fL (ref 80.0–100.0)
Platelets: 392 10*3/uL (ref 150–400)
RBC: 4.55 MIL/uL (ref 3.87–5.11)
RDW: 13.8 % (ref 11.5–15.5)
WBC: 13.4 10*3/uL — ABNORMAL HIGH (ref 4.0–10.5)
nRBC: 0 % (ref 0.0–0.2)

## 2022-12-03 LAB — BASIC METABOLIC PANEL
Anion gap: 9 (ref 5–15)
BUN: 16 mg/dL (ref 6–20)
CO2: 23 mmol/L (ref 22–32)
Calcium: 9.2 mg/dL (ref 8.9–10.3)
Chloride: 102 mmol/L (ref 98–111)
Creatinine, Ser: 0.87 mg/dL (ref 0.44–1.00)
GFR, Estimated: >60 mL/min (ref >60)
Glucose, Bld: 180 mg/dL — ABNORMAL HIGH (ref 70–99)
Potassium: 3.9 mmol/L (ref 3.5–5.1)
Sodium: 134 mmol/L — ABNORMAL LOW (ref 135–145)

## 2022-12-03 LAB — TROPONIN I (HIGH SENSITIVITY): Troponin I (High Sensitivity): <2 ng/L (ref <18)

## 2022-12-03 MED ORDER — PROMETHAZINE-DM 6.25-15 MG/5ML PO SYRP: 5.0000 mL | ORAL_SOLUTION | Freq: Four times a day (QID) | ORAL | 0 refills | Status: DC | PRN

## 2022-12-03 MED ORDER — AZITHROMYCIN 250 MG PO TABS: ORAL_TABLET | ORAL | 0 refills | Status: DC

## 2022-12-03 MED ORDER — PREDNISONE 20 MG PO TABS: 40.0000 mg | ORAL_TABLET | Freq: Every day | ORAL | 0 refills | Status: DC

## 2022-12-03 NOTE — ED Triage Notes (Signed)
Chest congestion x 2 weeks.  States chest burns when coughing and feels pain on left side of back when breathing in and coughing.  Has been taking mucinex and sudafed without relief.

## 2022-12-03 NOTE — ED Triage Notes (Signed)
Pt complains of chest pain, cough and congestion x 2 weeks. Pt was seen at urgent care today and states she was prescribed antibiotics and steriods. Pt said she didn't get a chest xray or EKG.

## 2022-12-04 NOTE — ED Provider Notes (Signed)
[place] [facility] AT [facility] Provider Note   CSN: [id number] Arrival date & time: 12/03/22  2211     History  Chief Complaint  Patient presents with   Chest Pain    Anna Andrews is a 27 y.o. female.  27 yo F here with cough and chest pain seen at [facility], started abx/prednisone. Husband wanted her to get checked out here because of the chest pain. No fevers. No le edema or pain. No h/o cardiac history. No risk factors for PE.    Chest Pain      Home Medications Prior to Admission medications   Medication Sig Start Date End Date Taking? Authorizing Provider  acetaminophen (TYLENOL) 500 MG tablet Take 500 mg by mouth every 6 (six) hours as needed.    [provider]  azithromycin (ZITHROMAX) 250 MG tablet Take first 2 tablets together, then 1 every day until finished. 12/03/22   Particia Nearing, PA-C  EPINEPHrine 0.3 mg/0.3 mL IJ SOAJ injection Inject 0.3 mg into the muscle as needed for anaphylaxis. 03/11/21   Devyn Griffing, Barbara Cower, MD  predniSONE (DELTASONE) 20 MG tablet Take 2 tablets (40 mg total) by mouth daily with breakfast. 12/03/22   Particia Nearing, PA-C  promethazine-dextromethorphan (PROMETHAZINE-DM) 6.25-15 MG/5ML syrup Take 5 mLs by mouth 4 (four) times daily as needed. 12/03/22   Particia Nearing, PA-C  sertraline (ZOLOFT) 50 MG tablet TAKE ONE TABLET (50MG  TOTAL) BY MOUTH DAILY 06/18/22   Cyril Mourning A, NP  enalapril (VASOTEC) 5 MG tablet Take 1 tablet (5 mg total) by mouth daily. Patient not taking: Reported on 06/02/2019 05/05/19 11/02/19  Jacklyn Shell, CNM      Allergies    Pineapple    Review of Systems   Review of Systems  Cardiovascular:  Positive for chest pain.    Physical Exam Updated Vital Signs BP 127/85   Pulse 96   Temp 98.7 F (37.1 C) (Oral)   Resp 16   LMP 11/17/2022 (Exact Date)   SpO2 99%  Physical Exam Vitals and nursing note reviewed.  Constitutional:      Appearance: She  is well-developed.  HENT:     Head: Normocephalic and atraumatic.  Cardiovascular:     Rate and Rhythm: Normal rate and regular rhythm.  Pulmonary:     Effort: No respiratory distress.     Breath sounds: No stridor.  Abdominal:     General: There is no distension.  Musculoskeletal:     Cervical back: Normal range of motion.     Right lower leg: No tenderness. No edema.     Left lower leg: No tenderness. No edema.  Neurological:     Mental Status: She is alert.     ED Results / Procedures / Treatments   Labs (all labs ordered are listed, but only abnormal results are displayed) Labs Reviewed  BASIC METABOLIC PANEL - Abnormal; Notable for the following components:      Result Value   Sodium 134 (*)    Glucose, Bld 180 (*)    All other components within normal limits  CBC - Abnormal; Notable for the following components:   WBC 13.4 (*)    All other components within normal limits  POC URINE PREG, ED  TROPONIN I (HIGH SENSITIVITY)    EKG EKG Interpretation Date/Time:  Wednesday December 03 2022 22:33:31 EDT Ventricular Rate:  92 PR Interval:  170 QRS Duration:  92 QT Interval:  356 QTC Calculation: 440 R  Axis:   60  Text Interpretation: Normal sinus rhythm Nonspecific ST abnormality No significant change since last tracing Confirmed by Cathren Laine ([id number]) on 12/03/2022 10:44:02 PM  Radiology DG Chest 2 View  Result Date: 12/03/2022 CLINICAL DATA:  Chest pain EXAM: CHEST - 2 VIEW COMPARISON:  Chest x-ray 12/18/2021 FINDINGS: The heart size and mediastinal contours are within normal limits. Both lungs are clear. The visualized skeletal structures are unremarkable. IMPRESSION: No active cardiopulmonary disease. Electronically Signed   By: Darliss Cheney M.D.   On: 12/03/2022 23:24    Procedures Procedures    Medications Ordered in ED Medications - No data to display  ED Course/ Medical Decision Making/ A&P                                 Medical Decision  Making Amount and/or Complexity of Data Reviewed Labs: ordered. Radiology: ordered.   Likely URI without obvious complication. Already on appropiate meds at home. No wheezing to suggest need for albuterol.   Final Clinical Impression(s) / ED Diagnoses Final diagnoses:  Upper respiratory tract infection, unspecified type    Rx / DC Orders ED Discharge Orders     None         Marium Ragan, Barbara Cower, MD 12/04/22 [phone]

## 2022-12-07 NOTE — ED Provider Notes (Signed)
[facility]    CSN: [id number] Arrival date & time: 12/03/22  4132      History   Chief Complaint Chief Complaint  Patient presents with   Cough    2 weeks sharp pain when I breath in and cough on left side shoulder - Entered by patient    HPI Anna Andrews is a 27 y.o. female.   Presenting today with 2 weeks of worsening chest congestion, productive cough. Denies fever, chills, body aches, CP, SOB, abdominal pain, N/V/D. So far trying mucinex and sudafed with minimal relief. Hx of seasonal allergies on prn regimen for this. No known diagnosed chronic pulmonary conditions.     Past Medical History:  Diagnosis Date   Acute non-recurrent frontal sinusitis 07/22/2021   Acute right-sided low back pain 05/14/2021   Anxiety    Eczema    Encounter for menstrual regulation 05/29/2021   Gestational hypertension 04/27/2019   Influenza 02/14/2021   Late period 05/14/2021   Pelvic pain 05/14/2021   Right calf pain 05/14/2021   Strep sore throat 04/10/2021   Supervision of normal first pregnancy 10/19/2018     FAMILY TREE  LAB RESULTS  Language English Pap 08/20/16 neg  Initiated care at [facility] GC/CT Initial: neg/neg        36wks: neg/neg  Dating by LMP c/w 9wk U/S    Support person [employer] NT/IT/AFP/MaterniT21:declined    [place]/HgbE neg  Flu vaccine 12/22/18  CF declined  TDaP vaccine 02/16/19  SMA declined  Rhogam N/A Fragile X declined       Anatomy US Normal female Blood Type A/Positive/-- (08/11    Symptoms of urinary tract infection 05/14/2021    Patient Active Problem List   Diagnosis Date Noted   Gastroesophageal reflux disease with esophagitis without hemorrhage 05/30/2021   Menorrhagia with regular cycle 05/29/2021   Dysmenorrhea 05/14/2021   Dyspareunia in female 05/14/2021   Annual physical exam 01/25/2021   Pain and swelling of right lower leg 01/25/2021   Obesity (BMI 30-39.9) 01/25/2021   Anxiety 10/19/2018    Past Surgical History:  Procedure  Laterality Date   NO PAST SURGERIES      OB History     Gravida  1   Para  1   Term  1   Preterm  0   AB  0   Living  1      SAB  0   IAB  0   Ectopic  0   Multiple  0   Live Births  1            Home Medications    Prior to Admission medications   Medication Sig Start Date End Date Taking? Authorizing Provider  azithromycin (ZITHROMAX) 250 MG tablet Take first 2 tablets together, then 1 every day until finished. 12/03/22  Yes Particia Nearing, PA-C  predniSONE (DELTASONE) 20 MG tablet Take 2 tablets (40 mg total) by mouth daily with breakfast. 12/03/22  Yes Particia Nearing, PA-C  promethazine-dextromethorphan (PROMETHAZINE-DM) 6.25-15 MG/5ML syrup Take 5 mLs by mouth 4 (four) times daily as needed. 12/03/22  Yes Particia Nearing, PA-C  acetaminophen (TYLENOL) 500 MG tablet Take 500 mg by mouth every 6 (six) hours as needed.    [provider]  EPINEPHrine 0.3 mg/0.3 mL IJ SOAJ injection Inject 0.3 mg into the muscle as needed for anaphylaxis. 03/11/21   Mesner, Barbara Cower, MD  sertraline (ZOLOFT) 50 MG tablet TAKE ONE TABLET (50MG  TOTAL) BY MOUTH  DAILY 06/18/22   Cyril Mourning A, NP  enalapril (VASOTEC) 5 MG tablet Take 1 tablet (5 mg total) by mouth daily. Patient not taking: Reported on 06/02/2019 05/05/19 11/02/19  Jacklyn Shell, CNM    Family History Family History  Problem Relation Age of Onset   Hypertension Paternal Grandfather    CAD Paternal Grandmother    Hypertension Mother     Social History Social History   Tobacco Use   Smoking status: Former    Current packs/day: 0.50    Types: Cigarettes   Smokeless tobacco: Never   Tobacco comments:    occ  Vaping Use   Vaping status: Never Used  Substance Use Topics   Alcohol use: No   Drug use: No     Allergies   Pineapple   Review of Systems Review of Systems PER HPI  Physical Exam Triage Vital Signs ED Triage Vitals  Encounter Vitals Group      BP 12/03/22 1015 111/78     Systolic BP Percentile --      Diastolic BP Percentile --      Pulse Rate 12/03/22 1015 91     Resp 12/03/22 1015 18     Temp 12/03/22 1015 98.7 F (37.1 C)     Temp Source 12/03/22 1015 Oral     SpO2 12/03/22 1015 97 %     Weight --      Height --      Head Circumference --      Peak Flow --      Pain Score 12/03/22 1017 8     Pain Loc --      Pain Education --      Exclude from Growth Chart --    No data found.  Updated Vital Signs BP 111/78 (BP Location: Right Arm)   Pulse 91   Temp 98.7 F (37.1 C) (Oral)   Resp 18   LMP 11/17/2022 (Exact Date)   SpO2 97%   Visual Acuity Right Eye Distance:   Left Eye Distance:   Bilateral Distance:    Right Eye Near:   Left Eye Near:    Bilateral Near:     Physical Exam Vitals and nursing note reviewed.  Constitutional:      Appearance: Normal appearance.  HENT:     Head: Atraumatic.     Right Ear: Tympanic membrane and external ear normal.     Left Ear: Tympanic membrane and external ear normal.     Nose: Congestion present.     Mouth/Throat:     Mouth: Mucous membranes are moist.     Pharynx: Posterior oropharyngeal erythema present.  Eyes:     Extraocular Movements: Extraocular movements intact.     Conjunctiva/sclera: Conjunctivae normal.  Cardiovascular:     Rate and Rhythm: Normal rate and regular rhythm.     Heart sounds: Normal heart sounds.  Pulmonary:     Effort: Pulmonary effort is normal.     Breath sounds: Wheezing (trace wheezes b/l) present. No rales.  Musculoskeletal:        General: Normal range of motion.     Cervical back: Normal range of motion and neck supple.  Skin:    General: Skin is warm and dry.  Neurological:     Mental Status: She is alert and oriented to person, place, and time.  Psychiatric:        Mood and Affect: Mood normal.        Thought Content: Thought content normal.  UC Treatments / Results  Labs (all labs ordered are listed, but only  abnormal results are displayed) Labs Reviewed - No data to display  EKG   Radiology No results found.  Procedures Procedures (including critical care time)  Medications Ordered in UC Medications - No data to display  Initial Impression / Assessment and Plan / UC Course  I have reviewed the triage vital signs and the nursing notes.  Pertinent labs & imaging results that were available during my care of the patient were reviewed by me and considered in my medical decision making (see chart for details).     Given duration and worsening course, treat with zithromax, pheneran DM, prednisone and supportive OTC medications and home care. Return for worsening sxs.  Final Clinical Impressions(s) / UC Diagnoses   Final diagnoses:  Lower respiratory infection   Discharge Instructions   None    ED Prescriptions     Medication Sig Dispense Auth. Provider   azithromycin (ZITHROMAX) 250 MG tablet Take first 2 tablets together, then 1 every day until finished. 6 tablet Particia Nearing, [place]   promethazine-dextromethorphan (PROMETHAZINE-DM) 6.25-15 MG/5ML syrup Take 5 mLs by mouth 4 (four) times daily as needed. 100 mL Particia Nearing, PA-C   predniSONE (DELTASONE) 20 MG tablet Take 2 tablets (40 mg total) by mouth daily with breakfast. 10 tablet Particia Nearing, [place]      PDMP not reviewed this encounter.   Particia Nearing, [place] 12/07/22 2259

## 2022-12-25 ENCOUNTER — Encounter: Payer: Self-pay | Admitting: Family

## 2022-12-25 ENCOUNTER — Ambulatory Visit (INDEPENDENT_AMBULATORY_CARE_PROVIDER_SITE_OTHER): Payer: 59 | Admitting: Family

## 2022-12-25 VITALS — BP 116/81 | HR 77 | Temp 98.0°F | Ht 68.0 in | Wt 225.4 lb

## 2022-12-25 DIAGNOSIS — E559 Vitamin D deficiency, unspecified: Secondary | ICD-10-CM | POA: Diagnosis not present

## 2022-12-25 DIAGNOSIS — J9801 Acute bronchospasm: Secondary | ICD-10-CM

## 2022-12-25 DIAGNOSIS — L299 Pruritus, unspecified: Secondary | ICD-10-CM

## 2022-12-25 DIAGNOSIS — B349 Viral infection, unspecified: Secondary | ICD-10-CM | POA: Diagnosis not present

## 2022-12-25 DIAGNOSIS — D72828 Other elevated white blood cell count: Secondary | ICD-10-CM | POA: Diagnosis not present

## 2022-12-25 LAB — CBC WITH DIFFERENTIAL/PLATELET
Basophils Absolute: 0.1 10*3/uL (ref 0.0–0.1)
Basophils Relative: 1 % (ref 0.0–3.0)
Eosinophils Absolute: 0.1 10*3/uL (ref 0.0–0.7)
Eosinophils Relative: 1.2 % (ref 0.0–5.0)
HCT: 39.2 % (ref 36.0–46.0)
Hemoglobin: 12.4 g/dL (ref 12.0–15.0)
Lymphocytes Relative: 18.5 % (ref 12.0–46.0)
Lymphs Abs: 1.3 10*3/uL (ref 0.7–4.0)
MCHC: 31.8 g/dL (ref 30.0–36.0)
MCV: 82.2 fL (ref 78.0–100.0)
Monocytes Absolute: 0.3 10*3/uL (ref 0.1–1.0)
Monocytes Relative: 3.9 % (ref 3.0–12.0)
Neutro Abs: 5.2 10*3/uL (ref 1.4–7.7)
Neutrophils Relative %: 75.4 % (ref 43.0–77.0)
Platelets: 301 10*3/uL (ref 150.0–400.0)
RBC: 4.77 Mil/uL (ref 3.87–5.11)
RDW: 14.6 % (ref 11.5–15.5)
WBC: 6.9 10*3/uL (ref 4.0–10.5)

## 2022-12-25 LAB — VITAMIN D 25 HYDROXY (VIT D DEFICIENCY, FRACTURES): VITD: 20.6 ng/mL — ABNORMAL LOW (ref 30.00–100.00)

## 2022-12-25 MED ORDER — ALBUTEROL SULFATE HFA 108 (90 BASE) MCG/ACT IN AERS
2.0000 | INHALATION_SPRAY | Freq: Four times a day (QID) | RESPIRATORY_TRACT | 0 refills | Status: AC | PRN
Start: 2022-12-25 — End: ?

## 2022-12-25 MED ORDER — HYDROCORTISONE-ACETIC ACID 1-2 % OT SOLN
4.0000 [drp] | Freq: Three times a day (TID) | OTIC | 0 refills | Status: DC
Start: 2022-12-25 — End: 2023-07-14

## 2022-12-25 NOTE — Progress Notes (Signed)
Patient ID: Anna Andrews, female    DOB: [birth year], 27 y.o.   MRN: [id number]  Chief Complaint  Patient presents with   Cough    Pt c/o cough, present for a month. Has tried Azithromycin and prednisone from urgent care which helped slightly but pt is still having white/yellow mucus.    *Discussed the use of AI scribe software for clinical note transcription with the patient, who gave verbal consent to proceed.  History of Present Illness   The patient, with a history of high cholesterol, presents with a persistent cough and ear discomfort. She reports a sensation of 'something sitting' in her chest and back, particularly when lying down. The cough produces a 'white yellowish' sputum. She was previously treated with a Z-Pak, prednisone, and promethazine cough syrup, which improved her symptoms but did not resolve them completely. The promethazine caused significant drowsiness, making it difficult for her to take during the day. She also reports a sensation of fullness and itchiness in her ear, which she has been attempting to alleviate with a bobby pin. She has been losing weight and exercising regularly, as previously advised. She recently got married.     Assessment & Plan:   Bronchospasm d/t viral illness -  Persistent cough and sputum production despite treatment with Z-Pak, prednisone, and promethazine. No improvement with current treatment. -Prescribe Albuterol inhaler, instruct to use 2 puffs in the morning, midday as needed, and an hour before bedtime for next 3-5 days until discomfort resolves.  Ear Discomfort  - Complaints of fullness and itching in the left ear, no signs of infection on examination, mild erythema noted in canal. -Advised to not use bobby pins in the ear. -Prescribe Acetic-Hydrocortisone ear drops for symptomatic relief. -Advised it can take up to 2 mos for ear congestion to resolve.  Vitamin D Deficiency - Patient reports taking daily Vitamin D3, but unsure of  dosage, previously had been on weekly RX thru September. -Recheck Vit D level today -Advise patient to check current dosage and increase to 5000 units daily if necessary.  Elevated White Blood Cell Count - Previous WBC count was 13.4, when seen in UC for viral illness, patient requests recheck. -Order a complete blood count to assess current WBC levels.      Subjective:    Outpatient Medications Prior to Visit  Medication Sig Dispense Refill   acetaminophen (TYLENOL) 500 MG tablet Take 500 mg by mouth every 6 (six) hours as needed.     EPINEPHrine 0.3 mg/0.3 mL IJ SOAJ injection Inject 0.3 mg into the muscle as needed for anaphylaxis. 2 each 1   promethazine-dextromethorphan (PROMETHAZINE-DM) 6.25-15 MG/5ML syrup Take 5 mLs by mouth 4 (four) times daily as needed. 100 mL 0   sertraline (ZOLOFT) 50 MG tablet TAKE ONE TABLET (50MG  TOTAL) BY MOUTH DAILY 30 tablet 3   azithromycin (ZITHROMAX) 250 MG tablet Take first 2 tablets together, then 1 every day until finished. (Patient not taking: Reported on 12/25/2022) 6 tablet 0   predniSONE (DELTASONE) 20 MG tablet Take 2 tablets (40 mg total) by mouth daily with breakfast. (Patient not taking: Reported on 12/25/2022) 10 tablet 0   No facility-administered medications prior to visit.   Past Medical History:  Diagnosis Date   Acute non-recurrent frontal sinusitis 07/22/2021   Acute right-sided low back pain 05/14/2021   Anxiety    Eczema    Encounter for menstrual regulation 05/29/2021   Gestational hypertension 04/27/2019   Influenza 02/14/2021   Late period  05/14/2021   Pelvic pain 05/14/2021   Right calf pain 05/14/2021   Strep sore throat 04/10/2021   Supervision of normal first pregnancy 10/19/2018     FAMILY TREE  LAB RESULTS  Language English Pap 08/20/16 neg  Initiated care at [facility] GC/CT Initial: neg/neg        36wks: neg/neg  Dating by LMP c/w 9wk U/S    Support person Cori Razor NT/IT/AFP/MaterniT21:declined    [place]/HgbE neg   Flu vaccine 12/22/18  CF declined  TDaP vaccine 02/16/19  SMA declined  Rhogam N/A Fragile X declined       Anatomy US Normal female Blood Type A/Positive/-- (08/11    Symptoms of urinary tract infection 05/14/2021   Past Surgical History:  Procedure Laterality Date   NO PAST SURGERIES     Allergies  Allergen Reactions   Pineapple Swelling      Objective:    Physical Exam Vitals and nursing note reviewed.  Constitutional:      Appearance: Normal appearance. She is obese.  Cardiovascular:     Rate and Rhythm: Normal rate and regular rhythm.  Pulmonary:     Effort: Pulmonary effort is normal.     Breath sounds: Normal breath sounds.  Musculoskeletal:        General: Normal range of motion.  Skin:    General: Skin is warm and dry.  Neurological:     Mental Status: She is alert.  Psychiatric:        Mood and Affect: Mood normal.        Behavior: Behavior normal.    BP 116/81 (BP Location: Left Arm, Patient Position: Sitting, Cuff Size: Large)   Pulse 77   Temp 98 F (36.7 C) (Temporal)   Ht 5\' 8"  (1.727 m)   Wt 225 lb 6 oz (102.2 kg)   LMP 11/17/2022 (Exact Date)   SpO2 97%   BMI 34.27 kg/m  Wt Readings from Last 3 Encounters:  12/25/22 225 lb 6 oz (102.2 kg)  09/01/22 232 lb 3.2 oz (105.3 kg)  05/13/22 236 lb 12.8 oz (107.4 kg)       Dulce Sellar, NP

## 2022-12-27 MED ORDER — VITAMIN D3 1.25 MG (50000 UT) PO CAPS
1.0000 | ORAL_CAPSULE | ORAL | 0 refills | Status: AC
Start: 2022-12-27 — End: ?

## 2022-12-27 NOTE — Addendum Note (Signed)
Addended byDulce Sellar on: 12/27/2022 10:02 AM   Modules accepted: Orders

## 2023-03-07 ENCOUNTER — Other Ambulatory Visit: Payer: Self-pay | Admitting: Adult Health

## 2023-03-14 ENCOUNTER — Other Ambulatory Visit: Payer: Self-pay | Admitting: Family

## 2023-03-14 ENCOUNTER — Encounter: Payer: Self-pay | Admitting: Family

## 2023-03-14 IMAGING — DX DG THORACIC SPINE 2V
3 series · 3 of 3 positions shown · non-contrast
Comparison: None.

CLINICAL DATA: Back pain for several months. Tenderness. No known
injury.

EXAM:
THORACIC SPINE 2 VIEWS

[t-spine lat]
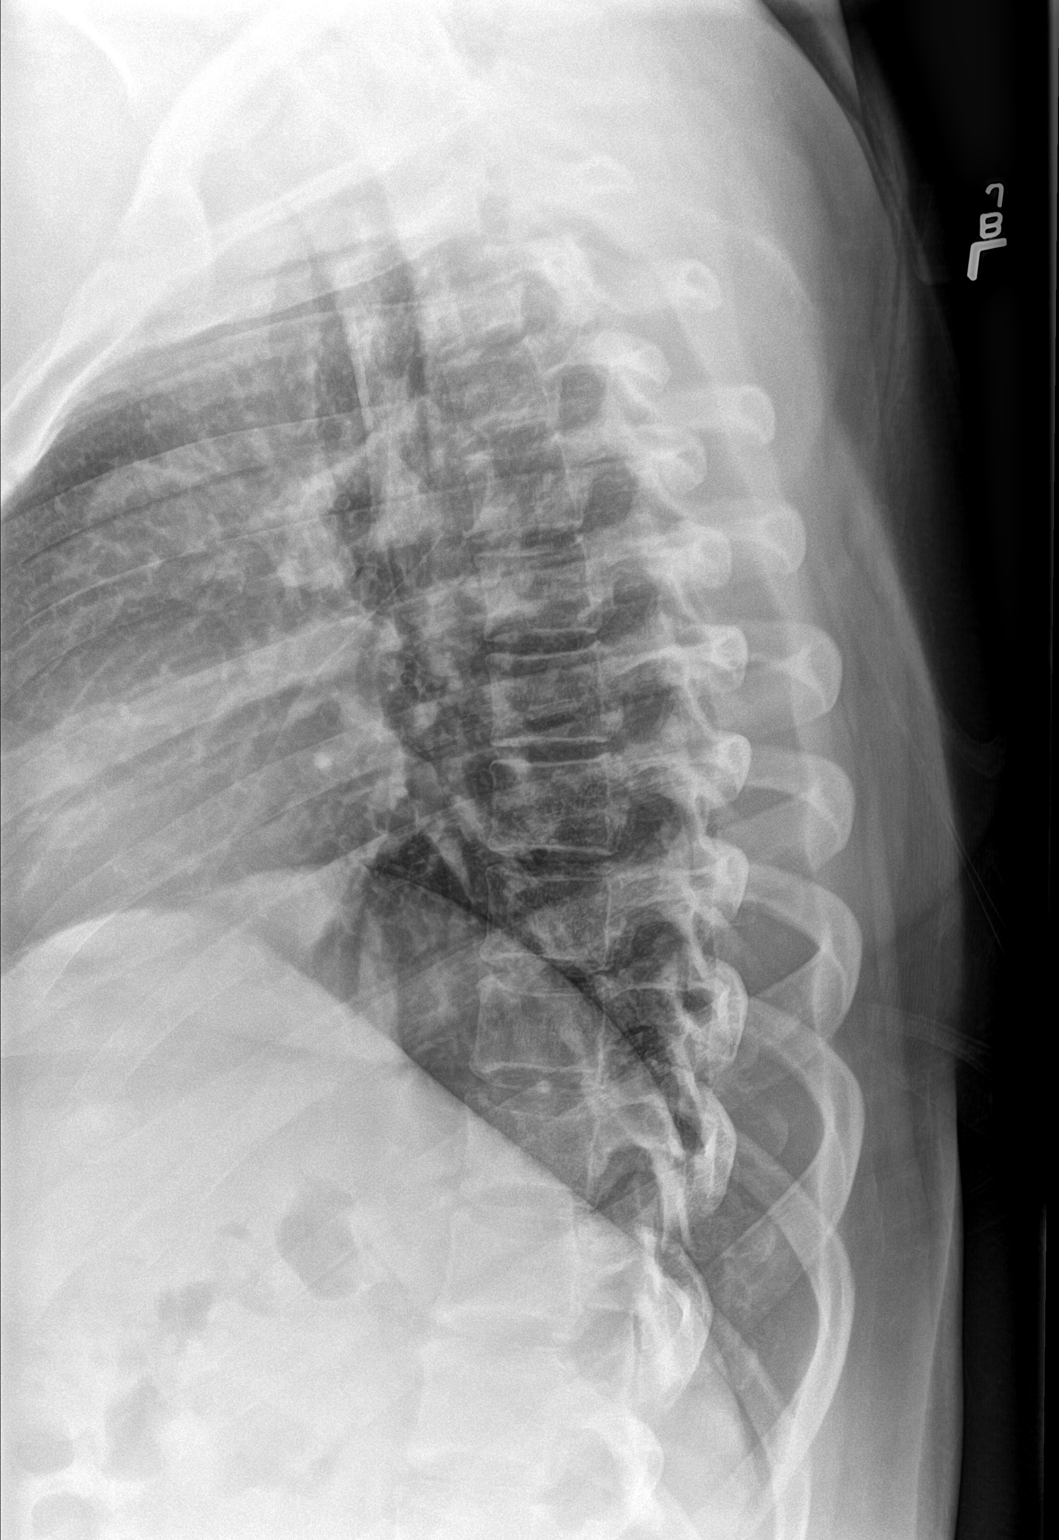

[ct-spine swimmers]
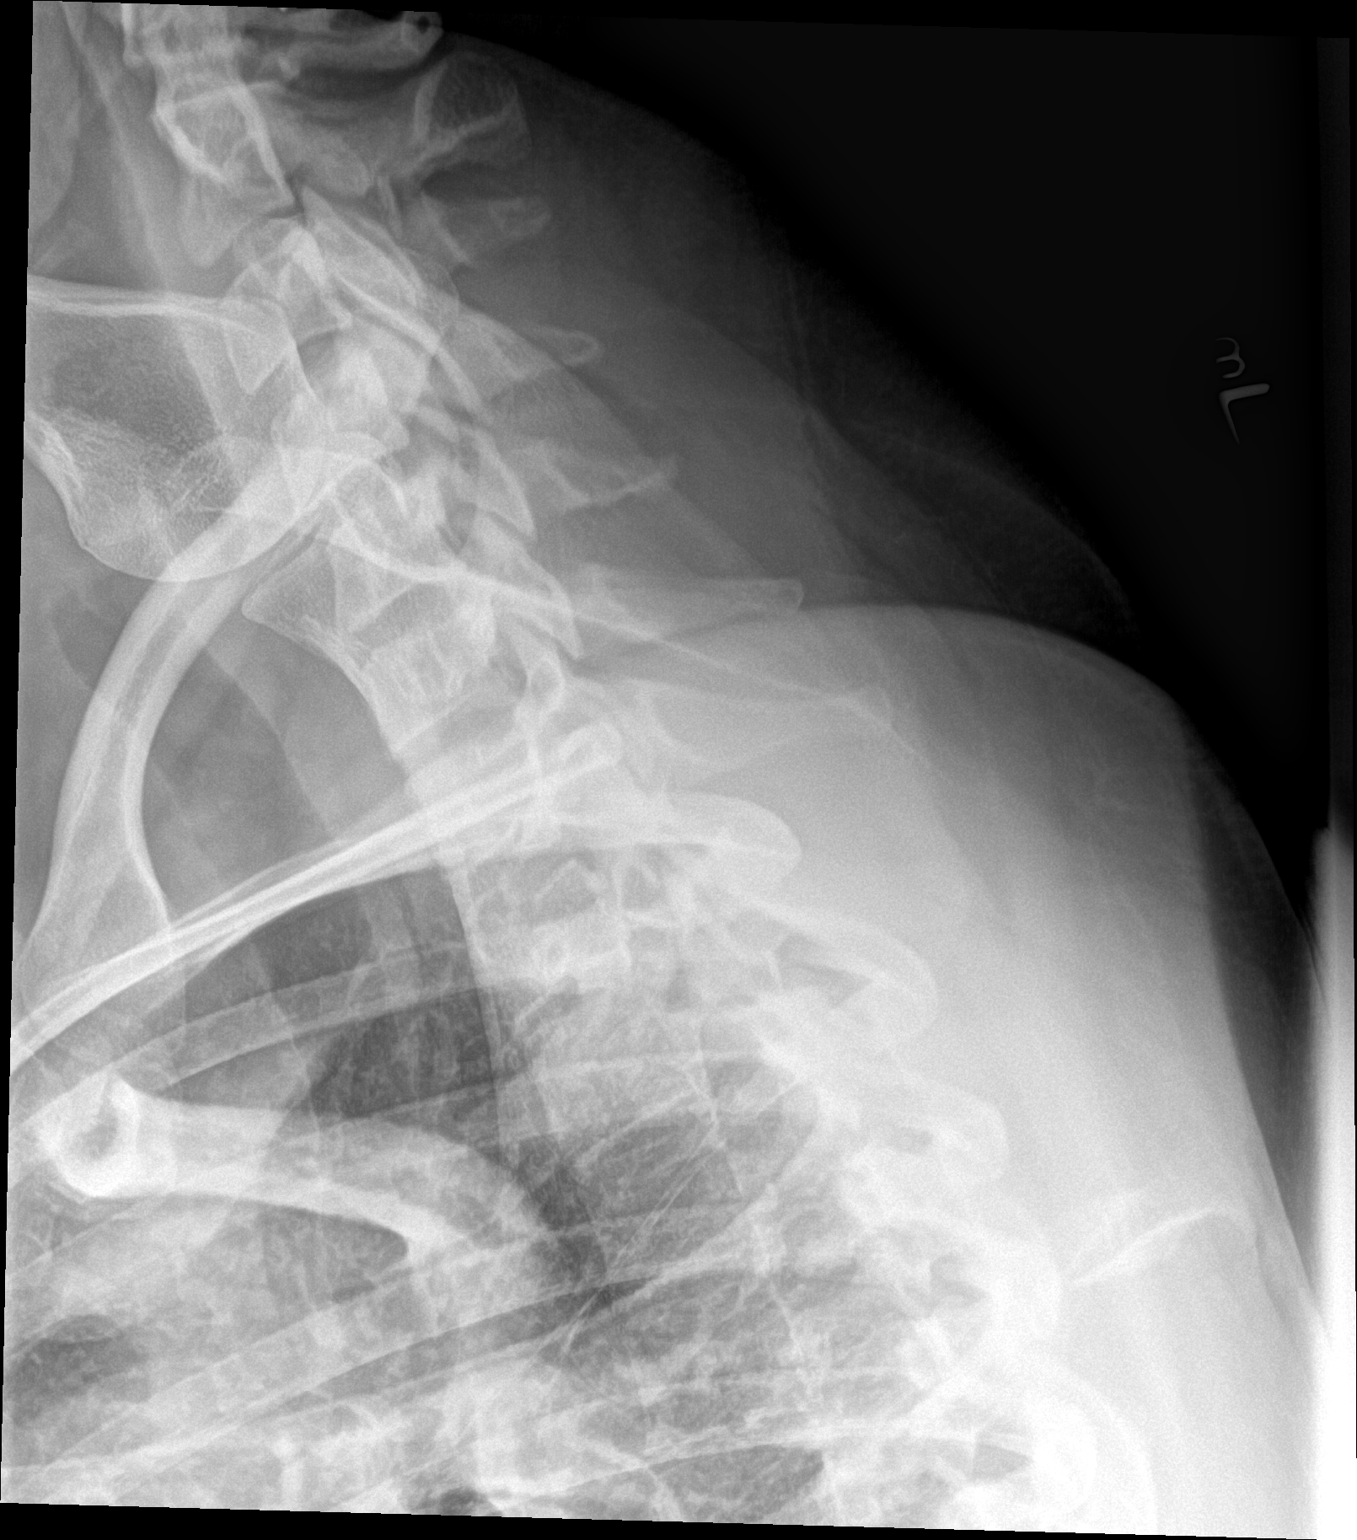

[t-spine ap]
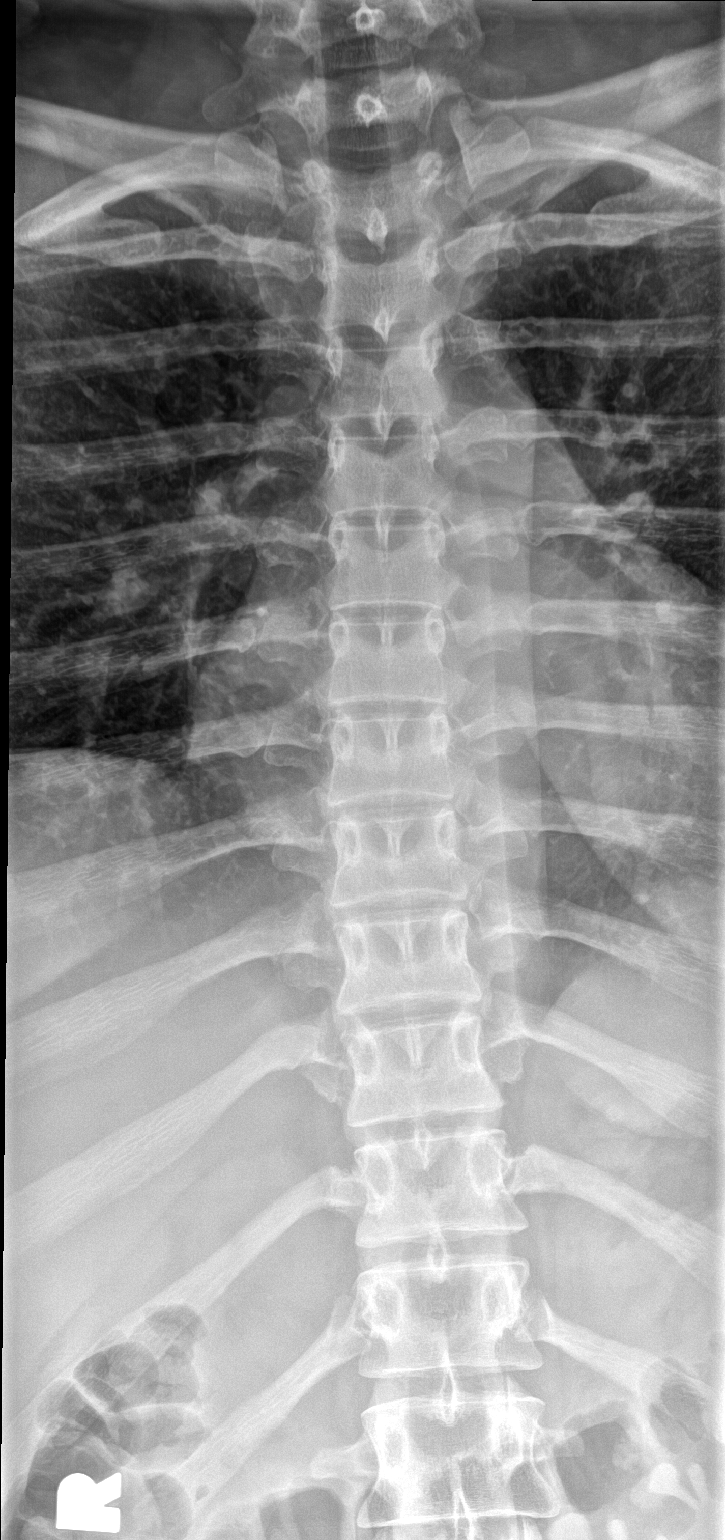

[3 of 3 positions shown; findings below may reference images not displayed]

FINDINGS: There is no evidence of thoracic spine fracture. Alignment is
normal. No other significant bone abnormalities are identified.
IMPRESSION: Negative.

## 2023-03-16 NOTE — Telephone Encounter (Signed)
 so advise Murdis I have never filled med before and will require a visit, ok if virtual. Has she been taking every day, or just wants to restart? thx.

## 2023-03-20 ENCOUNTER — Encounter: Payer: Self-pay | Admitting: Family

## 2023-03-20 ENCOUNTER — Telehealth (INDEPENDENT_AMBULATORY_CARE_PROVIDER_SITE_OTHER): Payer: 59 | Admitting: Family

## 2023-03-20 DIAGNOSIS — F419 Anxiety disorder, unspecified: Secondary | ICD-10-CM | POA: Diagnosis not present

## 2023-03-20 MED ORDER — SERTRALINE HCL 25 MG PO TABS: 25.0000 mg | ORAL_TABLET | Freq: Every day | ORAL | 1 refills | Status: DC

## 2023-03-20 NOTE — Progress Notes (Signed)
 MyChart Video Visit    Virtual Visit via Video Note   This format is felt to be most appropriate for this patient at this time. Physical exam was limited by quality of the video and audio technology used for the visit. CMA was able to get the patient set up on a video visit.  Patient location: Home. Patient and provider in visit Provider location: Office  I discussed the limitations of evaluation and management by telemedicine and the availability of in person appointments. The patient expressed understanding and agreed to proceed.  Visit Date: 03/20/2023  Today's healthcare provider: Lucius Krabbe, NP     Subjective:   Patient ID: Anna Andrews, female    DOB: 1995/08/31, 28 y.o.   MRN: [id number]  Chief Complaint  Patient presents with   Anxiety   Discussed the use of AI scribe software for clinical note transcription with the patient, who gave verbal consent to proceed.  History of Present Illness   The patient, with a history of an anxiety disorder managed with SSRIs, has been on Zoloft  for approximately a year after transitioning from Lexapro . She has been taking 25mg  daily, half of a 50mg  tablet, as a full tablet made her feel 'zoned out.' She has been consistent with her medication, taking it daily without running out. The medication was initially prescribed by her OB, not a psychiatrist.     Assessment & Plan:     Anxiety -  Chronic, Stable on Zoloft  25mg  daily. Discussed the possibility of increasing the dose to 25mg  twice daily to improve symptom control, but patient is currently satisfied with symptom control. -Continue Zoloft  25mg  daily. -Option to increase to 25mg  twice daily if needed to control sx, but may not have SE of feeling foggy headed. -Prescribe 90 tablets with 1 refill (6 month supply). -F/U in 6 mos.     Past Medical History:  Diagnosis Date   Acute non-recurrent frontal sinusitis 07/22/2021   Acute right-sided low back pain 05/14/2021    Anxiety    Eczema    Encounter for menstrual regulation 05/29/2021   Gestational hypertension 04/27/2019   Influenza 02/14/2021   Late period 05/14/2021   Pelvic pain 05/14/2021   Right calf pain 05/14/2021   Strep sore throat 04/10/2021   Supervision of normal first pregnancy 10/19/2018     FAMILY TREE  LAB RESULTS  Language English Pap 08/20/16 neg  Initiated care at [facility] GC/CT Initial: neg/neg        36wks: neg/neg  Dating by LMP c/w 9wk U/S    Support person [employer] NT/IT/AFP/MaterniT21:declined    [place]/HgbE neg  Flu vaccine 12/22/18  CF declined  TDaP vaccine 02/16/19  SMA declined  Rhogam N/A Fragile X declined       Anatomy US  Normal female Blood Type A/Positive/-- (08/11    Symptoms of urinary tract infection 05/14/2021    Past Surgical History:  Procedure Laterality Date   NO PAST SURGERIES      Outpatient Medications Prior to Visit  Medication Sig Dispense Refill   acetaminophen  (TYLENOL ) 500 MG tablet Take 500 mg by mouth every 6 (six) hours as needed.     acetic acid -hydrocortisone  (VOSOL -HC) OTIC solution Place 4 drops into the left ear 3 (three) times daily. Apply to affected ear. For ear canal irritation, itching. 10 mL 0   albuterol  (VENTOLIN  HFA) 108 (90 Base) MCG/ACT inhaler Inhale 2 puffs into the lungs every 6 (six) hours as needed for wheezing or shortness  of breath. 8 g 0   azithromycin  (ZITHROMAX ) 250 MG tablet Take first 2 tablets together, then 1 every day until finished. 6 tablet 0   Cholecalciferol (VITAMIN D3) 1.25 MG (50000 UT) CAPS Take 1 capsule (1.25 mg total) by mouth once a week. AFTER the last weekly pill, start a daily OTC Vitamin D3 up to 5,000units. 12 capsule 0   EPINEPHrine  0.3 mg/0.3 mL IJ SOAJ injection Inject 0.3 mg into the muscle as needed for anaphylaxis. 2 each 1   sertraline  (ZOLOFT ) 50 MG tablet TAKE ONE TABLET (50MG  TOTAL) BY MOUTH DAILY 30 tablet 3   predniSONE  (DELTASONE ) 20 MG tablet Take 2 tablets (40 mg total) by mouth daily with  breakfast. (Patient not taking: Reported on 03/20/2023) 10 tablet 0   promethazine -dextromethorphan (PROMETHAZINE -DM) 6.25-15 MG/5ML syrup Take 5 mLs by mouth 4 (four) times daily as needed. (Patient not taking: Reported on 03/20/2023) 100 mL 0   No facility-administered medications prior to visit.    Allergies  Allergen Reactions   Pineapple Swelling       Objective:   Physical Exam Vitals and nursing note reviewed.  Constitutional:      General: Pt is not in acute distress.    Appearance: Normal appearance.  HENT:     Head: Normocephalic.  Pulmonary:     Effort: No respiratory distress.  Musculoskeletal:     Cervical back: Normal range of motion.  Skin:    General: Skin is dry.     Coloration: Skin is not pale.  Neurological:     Mental Status: Pt is alert and oriented to person, place, and time.  Psychiatric:        Mood and Affect: Mood normal.   There were no vitals taken for this visit.  Wt Readings from Last 3 Encounters:  12/25/22 225 lb 6 oz (102.2 kg)  09/01/22 232 lb 3.2 oz (105.3 kg)  05/13/22 236 lb 12.8 oz (107.4 kg)      I discussed the assessment and treatment plan with the patient. The patient was provided an opportunity to ask questions and all were answered. The patient agreed with the plan and demonstrated an understanding of the instructions.   The patient was advised to call back or seek an in-person evaluation if the symptoms worsen or if the condition fails to improve as anticipated.  Lucius Krabbe, NP [facility] [phone] (phone) [phone] (fax)  [facility]

## 2023-03-20 NOTE — Telephone Encounter (Signed)
 Copied from CRM [id number]. Topic: Clinical - Prescription Issue >> Mar 20, 2023  9:22 AM Corean SAUNDERS wrote: Reason for CRM: Melissa from [facility] states that the patients new prescription for sertraline  (ZOLOFT ) 25 MG tablet used to always be 50 mg and would like clarification on this you can call the pharmacy back on their provider line at  [phone]   1548: Spoke with Melissa at [facility] to advise that patient has only been taking 1/2 a tablet, 25mg  total.

## 2023-03-20 NOTE — Assessment & Plan Note (Signed)
 Stable on Zoloft  25mg  daily. Discussed the possibility of increasing the dose to 25mg  twice daily to improve symptom control, but patient is currently satisfied with symptom control. -Continue Zoloft  25mg  daily. -Option to increase to 25mg  twice daily if needed to control sx, but may not have SE of feeling foggy headed. -Prescribe 90 tablets with 1 refill (6 month supply). -F/U in 6 mos.

## 2023-04-07 ENCOUNTER — Ambulatory Visit (INDEPENDENT_AMBULATORY_CARE_PROVIDER_SITE_OTHER): Payer: 59 | Admitting: Family

## 2023-04-07 ENCOUNTER — Encounter: Payer: Self-pay | Admitting: Family

## 2023-04-07 VITALS — BP 100/74 | HR 83 | Temp 97.3°F | Ht 68.0 in | Wt 225.2 lb

## 2023-04-07 DIAGNOSIS — J0110 Acute frontal sinusitis, unspecified: Secondary | ICD-10-CM | POA: Diagnosis not present

## 2023-04-07 DIAGNOSIS — H6690 Otitis media, unspecified, unspecified ear: Secondary | ICD-10-CM

## 2023-04-07 MED ORDER — AMOXICILLIN-POT CLAVULANATE 875-125 MG PO TABS
1.0000 | ORAL_TABLET | Freq: Two times a day (BID) | ORAL | 0 refills | Status: DC
Start: 2023-04-07 — End: 2023-07-14

## 2023-04-07 NOTE — Progress Notes (Signed)
Patient ID: Anna Andrews, female    DOB: 10-May-1995, 28 y.o.   MRN: [id number]  Chief Complaint  Patient presents with   right ear pain   chest congestioin    Pt c/o chest congestion with brown mucous that started 2 weeks ago.       Discussed the use of AI scribe software for clinical note transcription with the patient, who gave verbal consent to proceed.  History of Present Illness   The patient, with a history of hypertension and diabetes, presents with a two-week history of cough and ear pain. The ear pain is described as sharp and has been worsening over the past four days. The patient has been self-treating with alcohol, peroxide, and apple cider vinegar, which have not provided relief and may have exacerbated the symptoms. The patient also reports nasal drainage and chest discomfort, particularly when lying down. Over-the-counter treatments, including Coricidin, Vicks VapoRub, and Nyquil, have provided some relief and promoted drainage. The patient describes the mucus as thick and varying in color from brown with red streaks to bright yellow and gray. The patient also reports coughing up yellow mucus. The patient has been experiencing a headache for the past two days.     Assessment & Plan:     Sinusitis/URI - Symptoms of nasal drainage, cough, and ear pain. Mucus is colored and thick. Over-the-counter treatments have been ineffective. -Prescribe Augmentin for 7 days, may stop after 5 days if symptoms resolve. -Recommend OTC Sudafed for decongestion or generic Claritin-D for runny nose/congestion. -Advised on continued use of saline nasal spray for disinfection and moisturization as well as overnight humidifier. -Continue use of albuterol inhaler as needed for shortness of breath. -Continue taking zinc, vitamin C, vitamin D, and B12 as part of regular health regimen. -Consider at-home flu and COVID tests for future illness episodes. -Call office if sx persist after finishing  abt.  Right otitis media - Sharp ear pain, worsened by movement and blowing nose. -Treat with Augmentin bid x7d as part of sinusitis treatment plan, pt has had in past, reminded on use & SE. -Stop using OTC treatments, never use alcohol or vinegar in the ear, always best to have looked at in the office. -Call office if sx persist after finishing abt.     Subjective:    Outpatient Medications Prior to Visit  Medication Sig Dispense Refill   acetaminophen (TYLENOL) 500 MG tablet Take 500 mg by mouth every 6 (six) hours as needed.     acetic acid-hydrocortisone (VOSOL-HC) OTIC solution Place 4 drops into the left ear 3 (three) times daily. Apply to affected ear. For ear canal irritation, itching. 10 mL 0   albuterol (VENTOLIN HFA) 108 (90 Base) MCG/ACT inhaler Inhale 2 puffs into the lungs every 6 (six) hours as needed for wheezing or shortness of breath. 8 g 0   Cholecalciferol (VITAMIN D3) 1.25 MG (50000 UT) CAPS Take 1 capsule (1.25 mg total) by mouth once a week. AFTER the last weekly pill, start a daily OTC Vitamin D3 up to 5,000units. 12 capsule 0   EPINEPHrine 0.3 mg/0.3 mL IJ SOAJ injection Inject 0.3 mg into the muscle as needed for anaphylaxis. 2 each 1   sertraline (ZOLOFT) 25 MG tablet Take 1 tablet (25 mg total) by mouth daily. 90 tablet 1   azithromycin (ZITHROMAX) 250 MG tablet Take first 2 tablets together, then 1 every day until finished. (Patient not taking: Reported on 04/07/2023) 6 tablet 0   predniSONE (DELTASONE) 20  MG tablet Take 2 tablets (40 mg total) by mouth daily with breakfast. (Patient not taking: Reported on 04/07/2023) 10 tablet 0   promethazine-dextromethorphan (PROMETHAZINE-DM) 6.25-15 MG/5ML syrup Take 5 mLs by mouth 4 (four) times daily as needed. (Patient not taking: Reported on 04/07/2023) 100 mL 0   No facility-administered medications prior to visit.   Past Medical History:  Diagnosis Date   Acute non-recurrent frontal sinusitis 07/22/2021   Acute  right-sided low back pain 05/14/2021   Anxiety    Eczema    Encounter for menstrual regulation 05/29/2021   Gestational hypertension 04/27/2019   Influenza 02/14/2021   Late period 05/14/2021   Pelvic pain 05/14/2021   Right calf pain 05/14/2021   Strep sore throat 04/10/2021   Supervision of normal first pregnancy 10/19/2018     FAMILY TREE  LAB RESULTS  Language English Pap 08/20/16 neg  Initiated care at [facility] GC/CT Initial: neg/neg        36wks: neg/neg  Dating by LMP c/w 9wk U/S    Support person Cori Razor NT/IT/AFP/MaterniT21:declined    [place]/HgbE neg  Flu vaccine 12/22/18  CF declined  TDaP vaccine 02/16/19  SMA declined  Rhogam N/A Fragile X declined       Anatomy US Normal female Blood Type A/Positive/-- (08/11    Symptoms of urinary tract infection 05/14/2021   Past Surgical History:  Procedure Laterality Date   NO PAST SURGERIES     Allergies  Allergen Reactions   Pineapple Swelling      Objective:    Physical Exam Vitals and nursing note reviewed.  Constitutional:      Appearance: Normal appearance. She is ill-appearing.     Interventions: Face mask in place.  HENT:     Right Ear: Tympanic membrane and ear canal normal.     Left Ear: Tympanic membrane and ear canal normal.     Nose:     Right Sinus: Frontal sinus tenderness present.     Left Sinus: Frontal sinus tenderness present.     Mouth/Throat:     Mouth: Mucous membranes are moist.     Pharynx: Posterior oropharyngeal erythema present. No pharyngeal swelling, oropharyngeal exudate or uvula swelling.     Tonsils: No tonsillar exudate or tonsillar abscesses.  Cardiovascular:     Rate and Rhythm: Normal rate and regular rhythm.  Pulmonary:     Effort: Pulmonary effort is normal.     Breath sounds: Normal breath sounds.  Musculoskeletal:        General: Normal range of motion.  Lymphadenopathy:     Head:     Right side of head: No preauricular or posterior auricular adenopathy.     Left side of head: No  preauricular or posterior auricular adenopathy.     Cervical: No cervical adenopathy.  Skin:    General: Skin is warm and dry.  Neurological:     Mental Status: She is alert.  Psychiatric:        Mood and Affect: Mood normal.        Behavior: Behavior normal.    BP 100/74   Pulse 83   Temp (!) 97.3 F (36.3 C)   Ht 5\' 8"  (1.727 m)   Wt 225 lb 3.2 oz (102.2 kg)   SpO2 98%   BMI 34.24 kg/m  Wt Readings from Last 3 Encounters:  04/07/23 225 lb 3.2 oz (102.2 kg)  12/25/22 225 lb 6 oz (102.2 kg)  09/01/22 232 lb 3.2 oz (105.3 kg)  Dulce Sellar, NP

## 2023-06-24 ENCOUNTER — Emergency Department (HOSPITAL_COMMUNITY)
Admission: EM | Admit: 2023-06-24 | Discharge: 2023-06-24 | Disposition: A | Discharge status: Home / Self Care | Attending: Emergency Medicine | Admitting: Emergency Medicine

## 2023-06-24 ENCOUNTER — Other Ambulatory Visit: Payer: Self-pay

## 2023-06-24 ENCOUNTER — Encounter (HOSPITAL_COMMUNITY): Payer: Self-pay

## 2023-06-24 ENCOUNTER — Emergency Department (HOSPITAL_COMMUNITY)

## 2023-06-24 DIAGNOSIS — Y92410 Unspecified street and highway as the place of occurrence of the external cause: Secondary | ICD-10-CM | POA: Insufficient documentation

## 2023-06-24 DIAGNOSIS — S161XXA Strain of muscle, fascia and tendon at neck level, initial encounter: Secondary | ICD-10-CM | POA: Diagnosis not present

## 2023-06-24 DIAGNOSIS — R079 Chest pain, unspecified: Secondary | ICD-10-CM | POA: Diagnosis not present

## 2023-06-24 DIAGNOSIS — M546 Pain in thoracic spine: Secondary | ICD-10-CM | POA: Diagnosis not present

## 2023-06-24 DIAGNOSIS — S199XXA Unspecified injury of neck, initial encounter: Secondary | ICD-10-CM | POA: Diagnosis not present

## 2023-06-24 MED ORDER — LIDOCAINE 5 % EX PTCH
1.0000 | MEDICATED_PATCH | CUTANEOUS | Status: DC
  Administered 2023-06-24: 1 via TRANSDERMAL
  Filled 2023-06-24: qty 1

## 2023-06-24 MED ORDER — METHOCARBAMOL 500 MG PO TABS
500.0000 mg | ORAL_TABLET | Freq: Once | ORAL | Status: AC
  Administered 2023-06-24: 500 mg via ORAL
  Filled 2023-06-24: qty 1

## 2023-06-24 MED ORDER — METHOCARBAMOL 500 MG PO TABS: 500.0000 mg | ORAL_TABLET | Freq: Four times a day (QID) | ORAL | 0 refills | Status: DC | PRN

## 2023-06-24 MED ORDER — LIDOCAINE 5 % EX PTCH: 1.0000 | MEDICATED_PATCH | CUTANEOUS | 0 refills | Status: AC

## 2023-06-24 MED ORDER — NAPROXEN 250 MG PO TABS
500.0000 mg | ORAL_TABLET | Freq: Once | ORAL | Status: AC
  Administered 2023-06-24: 500 mg via ORAL
  Filled 2023-06-24: qty 2

## 2023-06-24 NOTE — ED Notes (Signed)
 Patient discharged. Provider spoke to patient. Paperwork given to patient and reviewed. Pt verbalized understanding. VSS. A+Ox4. Patient ambulated out of the ER with steady, independent gait. No iv in place.
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
## 2023-07-14 ENCOUNTER — Ambulatory Visit (INDEPENDENT_AMBULATORY_CARE_PROVIDER_SITE_OTHER): Admitting: Family

## 2023-07-14 VITALS — BP 117/84 | HR 75 | Temp 97.3°F | Ht 68.0 in | Wt 222.8 lb

## 2023-07-14 DIAGNOSIS — H65112 Acute and subacute allergic otitis media (mucoid) (sanguinous) (serous), left ear: Secondary | ICD-10-CM

## 2023-07-14 DIAGNOSIS — J309 Allergic rhinitis, unspecified: Secondary | ICD-10-CM | POA: Diagnosis not present

## 2023-07-14 DIAGNOSIS — H938X2 Other specified disorders of left ear: Secondary | ICD-10-CM | POA: Diagnosis not present

## 2023-07-14 MED ORDER — TRIAMCINOLONE ACETONIDE 55 MCG/ACT NA AERO: 1.0000 | INHALATION_SPRAY | Freq: Every day | NASAL | 2 refills | Status: DC

## 2023-07-14 MED ORDER — HYDROCORTISONE-ACETIC ACID 1-2 % OT SOLN: 3.0000 [drp] | Freq: Three times a day (TID) | OTIC | 0 refills | Status: AC
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
## 2023-08-07 ENCOUNTER — Ambulatory Visit
Admission: RE | Admit: 2023-08-07 | Discharge: 2023-08-07 | Disposition: A | Discharge status: Home / Self Care | Source: Ambulatory Visit | Attending: Family Medicine | Admitting: Family Medicine

## 2023-08-07 VITALS — BP 123/81 | HR 100 | Temp 98.0°F | Resp 18

## 2023-08-07 DIAGNOSIS — J3089 Other allergic rhinitis: Secondary | ICD-10-CM | POA: Diagnosis not present

## 2023-08-07 DIAGNOSIS — J209 Acute bronchitis, unspecified: Secondary | ICD-10-CM

## 2023-08-07 DIAGNOSIS — H6992 Unspecified Eustachian tube disorder, left ear: Secondary | ICD-10-CM | POA: Diagnosis not present

## 2023-08-07 MED ORDER — AZELASTINE HCL 0.1 % NA SOLN: 1.0000 | Freq: Two times a day (BID) | NASAL | 0 refills | Status: AC

## 2023-08-07 MED ORDER — PREDNISONE 20 MG PO TABS: 40.0000 mg | ORAL_TABLET | Freq: Every day | ORAL | 0 refills | Status: DC

## 2023-08-07 MED ORDER — ALBUTEROL SULFATE HFA 108 (90 BASE) MCG/ACT IN AERS: 2.0000 | INHALATION_SPRAY | RESPIRATORY_TRACT | 0 refills | Status: AC | PRN

## 2023-08-07 NOTE — ED Provider Notes (Signed)
 [facility]    CSN: [id number] Arrival date & time: 08/07/23  1459      History   Chief Complaint Chief Complaint  Patient presents with   Ear Pain    HPI Anna Andrews is a 28 y.o. female.   Patient presenting today with 3-week history of ongoing left-sided ear pain, fullness, muffled hearing.  Denies drainage or bleeding, loss of hearing, fever, chills, headache.  Does have known seasonal allergies on nasal spray and antihistamine regimen.  Was seen for this issue several weeks ago by PCP, was given steroid drops for ear canal itching but states the problem continues to worsen and has not improved.  Also now having a congested cough, chest tightness.    Past Medical History:  Diagnosis Date   Acute non-recurrent frontal sinusitis 07/22/2021   Acute right-sided low back pain 05/14/2021   Anxiety    Eczema    Encounter for menstrual regulation 05/29/2021   Gestational hypertension 04/27/2019   Influenza 02/14/2021   Late period 05/14/2021   Pelvic pain 05/14/2021   Right calf pain 05/14/2021   Strep sore throat 04/10/2021   Supervision of normal first pregnancy 10/19/2018     FAMILY TREE  LAB RESULTS  Language English Pap 08/20/16 neg  Initiated care at [facility] GC/CT Initial: neg/neg        36wks: neg/neg  Dating by LMP c/w 9wk U/S    Support person [employer] NT/IT/AFP/MaterniT21:declined    [place]/HgbE neg  Flu vaccine 12/22/18  CF declined  TDaP vaccine 02/16/19  SMA declined  Rhogam N/A Fragile X declined       Anatomy US  Normal female Blood Type A/Positive/-- (08/11    Symptoms of urinary tract infection 05/14/2021    Patient Active Problem List   Diagnosis Date Noted   Gastroesophageal reflux disease with esophagitis without hemorrhage 05/30/2021   Menorrhagia with regular cycle 05/29/2021   Dysmenorrhea 05/14/2021   Dyspareunia in female 05/14/2021   Annual physical exam 01/25/2021   Pain and swelling of right lower leg 01/25/2021   Obesity (BMI 30-39.9)  01/25/2021   Anxiety 10/19/2018    Past Surgical History:  Procedure Laterality Date   NO PAST SURGERIES      OB History     Gravida  1   Para  1   Term  1   Preterm  0   AB  0   Living  1      SAB  0   IAB  0   Ectopic  0   Multiple  0   Live Births  1            Home Medications    Prior to Admission medications   Medication Sig Start Date End Date Taking? Authorizing Provider  albuterol  (VENTOLIN  HFA) 108 (90 Base) MCG/ACT inhaler Inhale 2 puffs into the lungs every 4 (four) hours as needed. 08/07/23  Yes Corbin Dess, PA-C  azelastine  (ASTELIN ) 0.1 % nasal spray Place 1 spray into both nostrils 2 (two) times daily. Use in each nostril as directed 08/07/23  Yes Corbin Dess, PA-C  predniSONE  (DELTASONE ) 20 MG tablet Take 2 tablets (40 mg total) by mouth daily with breakfast. 08/07/23  Yes Corbin Dess, PA-C  acetaminophen  (TYLENOL ) 500 MG tablet Take 500 mg by mouth every 6 (six) hours as needed.    [provider]  acetic acid -hydrocortisone  (VOSOL -HC) OTIC solution Place 3 drops into the left ear 3 (three) times daily.  Apply to affected ear. For ear canal irritation, itching. 07/14/23   Versa Gore, NP  albuterol  (VENTOLIN  HFA) 108 (90 Base) MCG/ACT inhaler Inhale 2 puffs into the lungs every 6 (six) hours as needed for wheezing or shortness of breath. 12/25/22   Versa Gore, NP  Cholecalciferol (VITAMIN D3) 1.25 MG (50000 UT) CAPS Take 1 capsule (1.25 mg total) by mouth once a week. AFTER the last weekly pill, start a daily OTC Vitamin D3 up to 5,000units. 12/27/22   Versa Gore, NP  EPINEPHrine  0.3 mg/0.3 mL IJ SOAJ injection Inject 0.3 mg into the muscle as needed for anaphylaxis. 03/11/21   Mesner, Reymundo Caulk, MD  lidocaine  (LIDODERM ) 5 % Place 1 patch onto the skin daily. Remove & Discard patch within 12 hours or as directed by MD 06/24/23   Baxter Limber A, PA-C  methocarbamol  (ROBAXIN ) 500 MG tablet  Take 1 tablet (500 mg total) by mouth every 6 (six) hours as needed for muscle spasms. Patient not taking: Reported on 07/14/2023 06/24/23   Baxter Limber A, PA-C  sertraline  (ZOLOFT ) 25 MG tablet Take 1 tablet (25 mg total) by mouth daily. 03/20/23   Versa Gore, NP  triamcinolone  (NASACORT ) 55 MCG/ACT AERO nasal inhaler Place 1 spray into the nose daily. For all sinus or allergy symptoms. Start with 1 spray each side twice a day for 3 days, then reduce to daily. 07/14/23   Versa Gore, NP  enalapril  (VASOTEC ) 5 MG tablet Take 1 tablet (5 mg total) by mouth daily. Patient not taking: Reported on 06/02/2019 05/05/19 11/02/19  Majel Scott, CNM    Family History Family History  Problem Relation Age of Onset   Hypertension Paternal Grandfather    CAD Paternal Grandmother    Hypertension Mother     Social History Social History   Tobacco Use   Smoking status: Former    Current packs/day: 0.50    Types: Cigarettes   Smokeless tobacco: Never   Tobacco comments:    occ  Vaping Use   Vaping status: Never Used  Substance Use Topics   Alcohol use: No   Drug use: No     Allergies   Pineapple   Review of Systems Review of Systems Per HPI  Physical Exam Triage Vital Signs ED Triage Vitals  Encounter Vitals Group     BP 08/07/23 1507 123/81     Systolic BP Percentile --      Diastolic BP Percentile --      Pulse Rate 08/07/23 1507 100     Resp 08/07/23 1507 18     Temp 08/07/23 1507 98 F (36.7 C)     Temp src --      SpO2 08/07/23 1507 99 %     Weight --      Height --      Head Circumference --      Peak Flow --      Pain Score 08/07/23 1510 0     Pain Loc --      Pain Education --      Exclude from Growth Chart --    No data found.  Updated Vital Signs BP 123/81 (BP Location: Right Arm)   Pulse 100   Temp 98 F (36.7 C)   Resp 18   LMP 06/11/2023 (Exact Date)   SpO2 99%   Visual Acuity Right Eye Distance:   Left Eye Distance:    Bilateral Distance:    Right Eye Near:   Left Eye Near:  Bilateral Near:     Physical Exam Vitals and nursing note reviewed.  Constitutional:      Appearance: Normal appearance.  HENT:     Head: Atraumatic.     Right Ear: Tympanic membrane and external ear normal.     Left Ear: External ear normal.     Ears:     Comments: Left middle ear effusion    Nose: Rhinorrhea present.     Mouth/Throat:     Mouth: Mucous membranes are moist.     Pharynx: No posterior oropharyngeal erythema.  Eyes:     Extraocular Movements: Extraocular movements intact.     Conjunctiva/sclera: Conjunctivae normal.  Cardiovascular:     Rate and Rhythm: Normal rate and regular rhythm.     Heart sounds: Normal heart sounds.  Pulmonary:     Effort: Pulmonary effort is normal.     Breath sounds: Normal breath sounds. No wheezing or rales.  Musculoskeletal:        General: Normal range of motion.     Cervical back: Normal range of motion and neck supple.  Skin:    General: Skin is warm and dry.  Neurological:     Mental Status: She is alert and oriented to person, place, and time.  Psychiatric:        Mood and Affect: Mood normal.        Thought Content: Thought content normal.      UC Treatments / Results  Labs (all labs ordered are listed, but only abnormal results are displayed) Labs Reviewed - No data to display  EKG   Radiology No results found.  Procedures Procedures (including critical care time)  Medications Ordered in UC Medications - No data to display  Initial Impression / Assessment and Plan / UC Course  I have reviewed the triage vital signs and the nursing notes.  Pertinent labs & imaging results that were available during my care of the patient were reviewed by me and considered in my medical decision making (see chart for details).     Will treat for eustachian tube dysfunction and bronchitis likely all secondary from seasonal allergy exacerbation with prednisone ,  Astelin , antihistamines.  Albuterol  inhaler as needed.  Return for worsening symptoms.  Final Clinical Impressions(s) / UC Diagnoses   Final diagnoses:  Acute dysfunction of left eustachian tube  Acute bronchitis, unspecified organism  Seasonal allergic rhinitis due to other allergic trigger   Discharge Instructions   None    ED Prescriptions     Medication Sig Dispense Auth. Provider   azelastine  (ASTELIN ) 0.1 % nasal spray Place 1 spray into both nostrils 2 (two) times daily. Use in each nostril as directed 30 mL Corbin Dess, PA-C   albuterol  (VENTOLIN  HFA) 108 (90 Base) MCG/ACT inhaler Inhale 2 puffs into the lungs every 4 (four) hours as needed. 18 g Corbin Dess, PA-C   predniSONE  (DELTASONE ) 20 MG tablet Take 2 tablets (40 mg total) by mouth daily with breakfast. 6 tablet Corbin Dess, [place]      PDMP not reviewed this encounter.   Corbin Dess, [place] 08/07/23 [phone]

## 2023-08-07 NOTE — ED Triage Notes (Signed)
 Pt reports left sided ear ache, Cough and chest tightness, x 3 weeks

## 2023-09-03 ENCOUNTER — Encounter: Admitting: Family

## 2023-09-25 ENCOUNTER — Encounter: Payer: Self-pay | Admitting: Family

## 2023-09-25 ENCOUNTER — Ambulatory Visit: Admitting: Family

## 2023-09-25 ENCOUNTER — Other Ambulatory Visit (HOSPITAL_COMMUNITY)
Admission: RE | Admit: 2023-09-25 | Discharge: 2023-09-25 | Disposition: A | Discharge status: Home / Self Care | Source: Ambulatory Visit | Attending: Family | Admitting: Family

## 2023-09-25 VITALS — BP 106/78 | HR 77 | Temp 98.1°F | Ht 68.0 in | Wt 218.4 lb

## 2023-09-25 DIAGNOSIS — Z113 Encounter for screening for infections with a predominantly sexual mode of transmission: Secondary | ICD-10-CM | POA: Insufficient documentation

## 2023-09-25 DIAGNOSIS — E669 Obesity, unspecified: Secondary | ICD-10-CM | POA: Diagnosis not present

## 2023-09-25 DIAGNOSIS — E559 Vitamin D deficiency, unspecified: Secondary | ICD-10-CM | POA: Diagnosis not present

## 2023-09-25 DIAGNOSIS — Z0000 Encounter for general adult medical examination without abnormal findings: Secondary | ICD-10-CM

## 2023-09-25 LAB — LIPID PANEL
Cholesterol: 180 mg/dL (ref 0–200)
HDL: 46.4 mg/dL (ref >39.00)
LDL Cholesterol: 111 mg/dL — ABNORMAL HIGH (ref 0–99)
NonHDL: 133.87
Total CHOL/HDL Ratio: 4
Triglycerides: 114 mg/dL (ref 0.0–149.0)
VLDL: 22.8 mg/dL (ref 0.0–40.0)

## 2023-09-25 LAB — CBC WITH DIFFERENTIAL/PLATELET
Basophils Absolute: 0.1 K/uL (ref 0.0–0.1)
Basophils Relative: 1.1 % (ref 0.0–3.0)
Eosinophils Absolute: 0.1 K/uL (ref 0.0–0.7)
Eosinophils Relative: 1.7 % (ref 0.0–5.0)
HCT: 40.3 % (ref 36.0–46.0)
Hemoglobin: 13.3 g/dL (ref 12.0–15.0)
Lymphocytes Relative: 19.4 % (ref 12.0–46.0)
Lymphs Abs: 1.3 K/uL (ref 0.7–4.0)
MCHC: 32.9 g/dL (ref 30.0–36.0)
MCV: 81.9 fl (ref 78.0–100.0)
Monocytes Absolute: 0.3 K/uL (ref 0.1–1.0)
Monocytes Relative: 5 % (ref 3.0–12.0)
Neutro Abs: 4.8 K/uL (ref 1.4–7.7)
Neutrophils Relative %: 72.8 % (ref 43.0–77.0)
Platelets: 326 K/uL (ref 150.0–400.0)
RBC: 4.92 Mil/uL (ref 3.87–5.11)
RDW: 13.9 % (ref 11.5–15.5)
WBC: 6.7 K/uL (ref 4.0–10.5)

## 2023-09-25 LAB — COMPREHENSIVE METABOLIC PANEL WITH GFR
ALT: 12 U/L (ref 0–35)
AST: 13 U/L (ref 0–37)
Albumin: 4.4 g/dL (ref 3.5–5.2)
Alkaline Phosphatase: 45 U/L (ref 39–117)
BUN: 14 mg/dL (ref 6–23)
CO2: 26 meq/L (ref 19–32)
Calcium: 9.2 mg/dL (ref 8.4–10.5)
Chloride: 103 meq/L (ref 96–112)
Creatinine, Ser: 0.78 mg/dL (ref 0.40–1.20)
GFR: 102.11 mL/min (ref >60.00)
Glucose, Bld: 89 mg/dL (ref 70–99)
Potassium: 3.9 meq/L (ref 3.5–5.1)
Sodium: 137 meq/L (ref 135–145)
Total Bilirubin: 0.3 mg/dL (ref 0.2–1.2)
Total Protein: 7.8 g/dL (ref 6.0–8.3)

## 2023-09-25 LAB — VITAMIN D 25 HYDROXY (VIT D DEFICIENCY, FRACTURES): VITD: 30.28 ng/mL (ref 30.00–100.00)

## 2023-09-25 LAB — TSH: TSH: 1.38 u[IU]/mL (ref 0.35–5.50)

## 2023-09-25 NOTE — Progress Notes (Signed)
 Phone [phone]  Subjective:   Patient is a 28 y.o. female presenting for annual physical.    Chief Complaint  Patient presents with   Annual Exam    Fasting w/ labs    Ear Fullness    Pt c/o left ear fullness and painful. Has tried motherhood drops.    See problem oriented charting- ROS- full  review of systems was completed and negative.  The following were reviewed and entered/updated in epic: Past Medical History:  Diagnosis Date   Acute non-recurrent frontal sinusitis 07/22/2021   Acute right-sided low back pain 05/14/2021   Anxiety    Eczema    Encounter for menstrual regulation 05/29/2021   Gestational hypertension 04/27/2019   Influenza 02/14/2021   Late period 05/14/2021   Pelvic pain 05/14/2021   Right calf pain 05/14/2021   Strep sore throat 04/10/2021   Supervision of normal first pregnancy 10/19/2018     FAMILY TREE  LAB RESULTS  Language English Pap 08/20/16 neg  Initiated care at [facility] GC/CT Initial: neg/neg        36wks: neg/neg  Dating by LMP c/w 9wk U/S    Support person [employer] NT/IT/AFP/MaterniT21:declined    [place]/HgbE neg  Flu vaccine 12/22/18  CF declined  TDaP vaccine 02/16/19  SMA declined  Rhogam N/A Fragile X declined       Anatomy US  Normal female Blood Type A/Positive/-- (08/11    Symptoms of urinary tract infection 05/14/2021   Patient Active Problem List   Diagnosis Date Noted   Gastroesophageal reflux disease with esophagitis without hemorrhage 05/30/2021   Menorrhagia with regular cycle 05/29/2021   Dysmenorrhea 05/14/2021   Dyspareunia in female 05/14/2021   Annual physical exam 01/25/2021   Pain and swelling of right lower leg 01/25/2021   Obesity (BMI 30-39.9) 01/25/2021   Anxiety 10/19/2018   Past Surgical History:  Procedure Laterality Date   NO PAST SURGERIES      Family History  Problem Relation Age of Onset   Hypertension Paternal Grandfather    CAD Paternal Grandmother    Hypertension Mother     Medications-  reviewed and updated Current Outpatient Medications  Medication Sig Dispense Refill   albuterol  (VENTOLIN  HFA) 108 (90 Base) MCG/ACT inhaler Inhale 2 puffs into the lungs every 4 (four) hours as needed. 18 g 0   sertraline  (ZOLOFT ) 25 MG tablet Take 1 tablet (25 mg total) by mouth daily. 90 tablet 1   acetaminophen  (TYLENOL ) 500 MG tablet Take 500 mg by mouth every 6 (six) hours as needed. (Patient not taking: Reported on 09/25/2023)     acetic acid -hydrocortisone  (VOSOL -HC) OTIC solution Place 3 drops into the left ear 3 (three) times daily. Apply to affected ear. For ear canal irritation, itching. 10 mL 0   albuterol  (VENTOLIN  HFA) 108 (90 Base) MCG/ACT inhaler Inhale 2 puffs into the lungs every 6 (six) hours as needed for wheezing or shortness of breath. (Patient not taking: Reported on 09/25/2023) 8 g 0   azelastine  (ASTELIN ) 0.1 % nasal spray Place 1 spray into both nostrils 2 (two) times daily. Use in each nostril as directed 30 mL 0   Cholecalciferol (VITAMIN D3) 1.25 MG (50000 UT) CAPS Take 1 capsule (1.25 mg total) by mouth once a week. AFTER the last weekly pill, start a daily OTC Vitamin D3 up to 5,000units. 12 capsule 0   EPINEPHrine  0.3 mg/0.3 mL IJ SOAJ injection Inject 0.3 mg into the muscle as needed for anaphylaxis. 2 each 1  lidocaine  (LIDODERM ) 5 % Place 1 patch onto the skin daily. Remove & Discard patch within 12 hours or as directed by MD 30 patch 0   triamcinolone  (NASACORT ) 55 MCG/ACT AERO nasal inhaler Place 1 spray into the nose daily. For all sinus or allergy symptoms. Start with 1 spray each side twice a day for 3 days, then reduce to daily. 1 each 2   No current facility-administered medications for this visit.    Allergies-reviewed and updated Allergies  Allergen Reactions   Pineapple Swelling    Social History   Social History Narrative   Not on file    Objective:  BP 106/78 (BP Location: Left Arm, Patient Position: Sitting, Cuff Size: Large)   Pulse 77    Temp 98.1 F (36.7 C) (Temporal)   Ht 5' 8 (1.727 m)   Wt 218 lb 6.4 oz (99.1 kg)   LMP  (LMP Unknown)   SpO2 96%   BMI 33.21 kg/m      Assessment and Plan   Health Maintenance counseling: 1. Anticipatory guidance: Patient counseled regarding regular dental exams q6 months, eye exams,  avoiding smoking and second hand smoke, limiting alcohol to 1 beverage per day, no illicit drugs.   2. Risk factor reduction:  Advised patient of need for regular exercise and diet rich with fruits and vegetables to reduce risk of heart attack and stroke. Wt Readings from Last 3 Encounters:  09/25/23 218 lb 6.4 oz (99.1 kg)  07/14/23 222 lb 12.8 oz (101.1 kg)  06/24/23 215 lb (97.5 kg)   3. Immunizations/screenings/ancillary studies Immunization History  Administered Date(s) Administered   Influenza,inj,Quad PF,6+ Mos 12/22/2018   Moderna Sars-Covid-2 Vaccination 09/26/2019   Tdap 02/16/2019   There are no preventive care reminders to display for this patient.  4. Cervical cancer screening: due 2027 5. Skin cancer screening- advised regular sunscreen use. Denies worrisome, changing, or new skin lesions.  6. Birth control/STD check: none, will order today 7. Smoking associated screening: non- smoker 8. Alcohol screening:  socially  Annual physical exam -     Comprehensive metabolic panel with GFR -     CBC with Differential/Platelet -     Lipid panel -     TSH  Vitamin D  deficiency -     VITAMIN D  25 Hydroxy (Vit-D Deficiency, Fractures)  Routine screening for STI (sexually transmitted infection) -     Urine cytology ancillary only  Obesity (BMI 30-39.9) Assessment & Plan: Has been working on weight loss, weight down 14lbs from last year. Has tried intermittent fasting lately, trying to reduce carbs and exercising more. Praised efforts. - Encourage to continue to work on weight loss strategies.   Recommended follow up:  Return for any future concerns, Complete physical w/fasting  labs. No future appointments.  Lab/Order associations: fasting    Lucius Krabbe, NP

## 2023-09-25 NOTE — Patient Instructions (Signed)
 It was very nice to see you today!   I will review your lab results via MyChart in a few days.  Keep working on the weight loss, you look great! Have a great rest of the summer!     PLEASE NOTE:  If you had any lab tests please let us  know if you have not heard back within a few days. You may see your results on MyChart before we have a chance to review them but we will give you a call once they are reviewed by us . If we ordered any referrals today, please let us  know if you have not heard from their office within the next week.

## 2023-09-25 NOTE — Assessment & Plan Note (Signed)
 Has been working on weight loss, weight down 14lbs from last year. Has tried intermittent fasting lately, trying to reduce carbs and exercising more. Praised efforts. - Encourage to continue to work on weight loss strategies.

## 2023-09-28 ENCOUNTER — Ambulatory Visit: Payer: Self-pay | Admitting: Family

## 2023-09-28 LAB — URINE CYTOLOGY ANCILLARY ONLY
Chlamydia: NEGATIVE
Comment: NEGATIVE
Comment: NEGATIVE
Comment: NORMAL
Neisseria Gonorrhea: NEGATIVE
Trichomonas: NEGATIVE

## 2023-10-07 ENCOUNTER — Emergency Department (HOSPITAL_COMMUNITY)

## 2023-10-07 ENCOUNTER — Encounter (HOSPITAL_COMMUNITY): Payer: Self-pay

## 2023-10-07 ENCOUNTER — Emergency Department (HOSPITAL_COMMUNITY)
Admission: EM | Admit: 2023-10-07 | Discharge: 2023-10-07 | Disposition: A | Discharge status: Home / Self Care | Attending: Emergency Medicine | Admitting: Emergency Medicine

## 2023-10-07 ENCOUNTER — Other Ambulatory Visit: Payer: Self-pay

## 2023-10-07 DIAGNOSIS — D72829 Elevated white blood cell count, unspecified: Secondary | ICD-10-CM | POA: Insufficient documentation

## 2023-10-07 DIAGNOSIS — R142 Eructation: Secondary | ICD-10-CM | POA: Insufficient documentation

## 2023-10-07 DIAGNOSIS — R079 Chest pain, unspecified: Secondary | ICD-10-CM | POA: Diagnosis not present

## 2023-10-07 DIAGNOSIS — R0789 Other chest pain: Secondary | ICD-10-CM | POA: Diagnosis not present

## 2023-10-07 DIAGNOSIS — K219 Gastro-esophageal reflux disease without esophagitis: Secondary | ICD-10-CM | POA: Insufficient documentation

## 2023-10-07 LAB — CBC WITH DIFFERENTIAL/PLATELET
Abs Immature Granulocytes: 0.04 K/uL (ref 0.00–0.07)
Basophils Absolute: 0.1 K/uL (ref 0.0–0.1)
Basophils Relative: 1 %
Eosinophils Absolute: 0.1 K/uL (ref 0.0–0.5)
Eosinophils Relative: 1 %
HCT: 42 % (ref 36.0–46.0)
Hemoglobin: 13.8 g/dL (ref 12.0–15.0)
Immature Granulocytes: 0 %
Lymphocytes Relative: 17 %
Lymphs Abs: 1.9 K/uL (ref 0.7–4.0)
MCH: 27.5 pg (ref 26.0–34.0)
MCHC: 32.9 g/dL (ref 30.0–36.0)
MCV: 83.8 fL (ref 80.0–100.0)
Monocytes Absolute: 0.4 K/uL (ref 0.1–1.0)
Monocytes Relative: 4 %
Neutro Abs: 8.6 K/uL — ABNORMAL HIGH (ref 1.7–7.7)
Neutrophils Relative %: 77 %
Platelets: 365 K/uL (ref 150–400)
RBC: 5.01 MIL/uL (ref 3.87–5.11)
RDW: 13.2 % (ref 11.5–15.5)
WBC: 11.1 K/uL — ABNORMAL HIGH (ref 4.0–10.5)
nRBC: 0 % (ref 0.0–0.2)

## 2023-10-07 LAB — COMPREHENSIVE METABOLIC PANEL WITH GFR
ALT: 15 U/L (ref 0–44)
AST: 15 U/L (ref 15–41)
Albumin: 4.4 g/dL (ref 3.5–5.0)
Alkaline Phosphatase: 51 U/L (ref 38–126)
Anion gap: 14 (ref 5–15)
BUN: 11 mg/dL (ref 6–20)
CO2: 22 mmol/L (ref 22–32)
Calcium: 9.3 mg/dL (ref 8.9–10.3)
Chloride: 103 mmol/L (ref 98–111)
Creatinine, Ser: 0.74 mg/dL (ref 0.44–1.00)
GFR, Estimated: >60 mL/min (ref >60)
Glucose, Bld: 81 mg/dL (ref 70–99)
Potassium: 3.4 mmol/L — ABNORMAL LOW (ref 3.5–5.1)
Sodium: 139 mmol/L (ref 135–145)
Total Bilirubin: 0.8 mg/dL (ref 0.0–1.2)
Total Protein: 8.3 g/dL — ABNORMAL HIGH (ref 6.5–8.1)

## 2023-10-07 LAB — TROPONIN I (HIGH SENSITIVITY): Troponin I (High Sensitivity): <2 ng/L (ref <18)

## 2023-10-07 LAB — D-DIMER, QUANTITATIVE: D-Dimer, Quant: 0.36 ug{FEU}/mL (ref 0.00–0.50)

## 2023-10-07 LAB — LIPASE, BLOOD: Lipase: 25 U/L (ref 11–51)

## 2023-10-07 MED ORDER — HYOSCYAMINE SULFATE 0.125 MG SL SUBL
0.1250 mg | SUBLINGUAL_TABLET | Freq: Once | SUBLINGUAL | Status: AC
  Administered 2023-10-07: 0.125 mg via SUBLINGUAL

## 2023-10-07 MED ORDER — HYOSCYAMINE SULFATE 0.125 MG PO TABS
0.1250 mg | ORAL_TABLET | Freq: Once | ORAL | Status: DC
  Filled 2023-10-07: qty 1

## 2023-10-07 MED ORDER — SIMETHICONE 80 MG PO CHEW
80.0000 mg | CHEWABLE_TABLET | Freq: Once | ORAL | Status: AC
  Administered 2023-10-07: 80 mg via ORAL
  Filled 2023-10-07: qty 1

## 2023-10-07 MED ORDER — ALUM & MAG HYDROXIDE-SIMETH 200-200-20 MG/5ML PO SUSP
30.0000 mL | Freq: Once | ORAL | Status: AC
  Administered 2023-10-07: 30 mL via ORAL
  Filled 2023-10-07: qty 30

## 2023-10-07 MED ORDER — PANTOPRAZOLE SODIUM 40 MG PO TBEC
40.0000 mg | DELAYED_RELEASE_TABLET | Freq: Every day | ORAL | 0 refills | Status: AC
Start: 2023-10-07 — End: ?

## 2023-10-07 NOTE — ED Notes (Signed)
 This RN has called the [facility] to ask her to please bring the ordered Simethicone  and Hyoscyamine  tablets.

## 2023-10-07 NOTE — ED Triage Notes (Signed)
 Pt arrived via POV c/o sternal chest pain that began last night and reports it radiates to the middle of her back. Pt reports the pain is a burning discomfort and has tried TUMS w/o relief. Pt reports tenders to left rib cage as well.

## 2023-10-07 NOTE — Discharge Instructions (Addendum)
 Taking care of you today.  You are seen for chest pain and belching.  Your workup was overall reassuring, we will rule out signs of heart attack, blood clot in your lung, pneumonia, collapsed lung or any other emergent cause of your discomfort.  Avoid eating the high-fiber foods for the next few days, you can take over-the-counter simethicone  (Gas-X) as directed on packaging to help with the belching and gas and you can also take over-the-counter Pepcid  as needed for the symptoms of burning which are likely related to Gas-X.  I have prescribed Protonix  which is occasionally decreases the stomach acid production.  Follow-up closely with your PCP.  If your symptoms are continuing you should also follow-up with the GI doctor.  I provided their phone number.  Back to the ER for new or worsening symptoms.

## 2023-10-07 NOTE — ED Provider Notes (Signed)
 [place] EMERGENCY DEPARTMENT AT [facility] Provider Note   CSN: [id number] Arrival date & time: 10/07/23  1643     Patient presents with: Chest Pain   Anna Andrews is a 27 y.o. female.  She has PMH of anxiety and dysmenorrhea.  Presents to the ER today complaining of chest pain.  This started 2 days ago..  She is having burning in her epigastric area and chest and some pain in her left upper back and having sharp pain under her left breast.  Is not worse exertion and her symptoms get worse with lying down.  She has tried Tums and Beano as she thought was potentially acid reflux or gas.  She is having some excessive belching.  She is worried today when her symptoms were still persistent.  She states she does feel slightly short of breath, denies lower extremity swelling or pain, denies history of VTE, denies use of hormonal medicine, no hemoptysis.  No history of VTE or PE, no recent travel or immobilization.    Chest Pain      Prior to Admission medications   Medication Sig Start Date End Date Taking? Authorizing Provider  pantoprazole  (PROTONIX ) 40 MG tablet Take 1 tablet (40 mg total) by mouth daily. 10/07/23  Yes Arlina Sabina A, PA-C  acetaminophen  (TYLENOL ) 500 MG tablet Take 500 mg by mouth every 6 (six) hours as needed. Patient not taking: Reported on 09/25/2023    [provider]  acetic acid -hydrocortisone  (VOSOL -HC) OTIC solution Place 3 drops into the left ear 3 (three) times daily. Apply to affected ear. For ear canal irritation, itching. 07/14/23   Lucius Krabbe, NP  albuterol  (VENTOLIN  HFA) 108 (90 Base) MCG/ACT inhaler Inhale 2 puffs into the lungs every 6 (six) hours as needed for wheezing or shortness of breath. 12/25/22   Lucius Krabbe, NP  albuterol  (VENTOLIN  HFA) 108 (90 Base) MCG/ACT inhaler Inhale 2 puffs into the lungs every 4 (four) hours as needed. 08/07/23   Stuart Vernell Norris, PA-C  azelastine  (ASTELIN ) 0.1 % nasal spray Place  1 spray into both nostrils 2 (two) times daily. Use in each nostril as directed 08/07/23   Stuart Vernell Norris, PA-C  Cholecalciferol (VITAMIN D3) 1.25 MG (50000 UT) CAPS Take 1 capsule (1.25 mg total) by mouth once a week. AFTER the last weekly pill, start a daily OTC Vitamin D3 up to 5,000units. 12/27/22   Lucius Krabbe, NP  EPINEPHrine  0.3 mg/0.3 mL IJ SOAJ injection Inject 0.3 mg into the muscle as needed for anaphylaxis. 03/11/21   Mesner, Selinda, MD  lidocaine  (LIDODERM ) 5 % Place 1 patch onto the skin daily. Remove & Discard patch within 12 hours or as directed by MD 06/24/23   Suellen Cantor A, PA-C  sertraline  (ZOLOFT ) 25 MG tablet Take 1 tablet (25 mg total) by mouth daily. 03/20/23   Lucius Krabbe, NP  triamcinolone  (NASACORT ) 55 MCG/ACT AERO nasal inhaler Place 1 spray into the nose daily. For all sinus or allergy symptoms. Start with 1 spray each side twice a day for 3 days, then reduce to daily. 07/14/23   Lucius Krabbe, NP  enalapril  (VASOTEC ) 5 MG tablet Take 1 tablet (5 mg total) by mouth daily. Patient not taking: Reported on 06/02/2019 05/05/19 11/02/19  Newton Mering, CNM    Allergies: Pineapple    Review of Systems  Cardiovascular:  Positive for chest pain.    Updated Vital Signs BP 131/84 (BP Location: Right Arm)   Pulse 80   Temp  98.5 F (36.9 C) (Oral)   Resp 17   Ht 5' 8 (1.727 m)   Wt 99 kg   LMP 10/07/2023 (Exact Date)   SpO2 100%   BMI 33.19 kg/m   Physical Exam Vitals and nursing note reviewed.  Constitutional:      General: She is not in acute distress.    Appearance: She is well-developed.  HENT:     Head: Normocephalic and atraumatic.  Eyes:     Conjunctiva/sclera: Conjunctivae normal.  Cardiovascular:     Rate and Rhythm: Normal rate and regular rhythm.     Heart sounds: No murmur heard. Pulmonary:     Effort: Pulmonary effort is normal. No respiratory distress.     Breath sounds: Normal breath sounds.  Chest:      Chest wall: Tenderness present.    Abdominal:     General: Abdomen is flat.     Palpations: Abdomen is soft.     Tenderness: There is no abdominal tenderness. There is no right CVA tenderness, left CVA tenderness, guarding or rebound. Negative signs include Murphy's sign.  Musculoskeletal:        General: No swelling.     Cervical back: Neck supple.     Right lower leg: No tenderness. No edema.     Left lower leg: No tenderness. No edema.  Skin:    General: Skin is warm and dry.     Capillary Refill: Capillary refill takes less than 2 seconds.  Neurological:     General: No focal deficit present.     Mental Status: She is alert and oriented to person, place, and time.  Psychiatric:        Mood and Affect: Mood normal.     (all labs ordered are listed, but only abnormal results are displayed) Labs Reviewed  CBC WITH DIFFERENTIAL/PLATELET - Abnormal; Notable for the following components:      Result Value   WBC 11.1 (*)    Neutro Abs 8.6 (*)    All other components within normal limits  COMPREHENSIVE METABOLIC PANEL WITH GFR - Abnormal; Notable for the following components:   Potassium 3.4 (*)    Total Protein 8.3 (*)    All other components within normal limits  LIPASE, BLOOD  D-DIMER, QUANTITATIVE  TROPONIN I (HIGH SENSITIVITY)    EKG: None  Radiology: DG Chest 2 View Result Date: 10/07/2023 CLINICAL DATA:  Chest pain. Sternal chest pain beginning last night and radiating to the back. EXAM: CHEST - 2 VIEW COMPARISON:  06/24/2023 FINDINGS: The heart size and mediastinal contours are within normal limits. Both lungs are clear. The visualized skeletal structures are unremarkable. IMPRESSION: No active cardiopulmonary disease. Electronically Signed   By: Elsie Gravely M.D.   On: 10/07/2023 17:16     Procedures   Medications Ordered in the ED  simethicone  (MYLICON) chewable tablet 80 mg (has no administration in time range)  hyoscyamine  (LEVSIN ) tablet 0.125 mg (has  no administration in time range)  alum & mag hydroxide-simeth (MAALOX/MYLANTA) 200-200-20 MG/5ML suspension 30 mL (30 mLs Oral Given 10/07/23 1854)                                    Medical Decision Making Differential diagnosis includes but not limited to GERD, ACS, PE, pneumonia, gastritis, pancreatitis, cholecystitis, other  ED course: Patient presents with burning in her chest, frequent belching and left lateral chest wall pain  that started 2 days ago.  No history of heart disease hypertension high cholesterol, he is non-smoker.  There was for PE and ruled out PE with D-dimer which was negative.  Troponin was negative and symptoms ongoing for 2 days, EKG is normal, chest x-ray is normal.  No suspect cardiac cause of her symptoms.  This is likely related to primarily acid reflux.  She states she has been eating a lot of very spicy foods and also high-fiber foods including large amounts of cabbage and beans over the past week and thinks this exacerbated her symptoms.  She was given a GI cocktail and had significant improvement of her symptoms but is still having some frequent belching, will give Gas-X and Levsin .  Will start on PPI for the stomach burning advised on OTC simethicone  and Pepcid  as needed.  She was given strict follow-up and return precautions.  Amount and/or Complexity of Data Reviewed External Data Reviewed: notes. Labs: ordered.    Details: D-dimer negative, troponin less than 2, CMP with no acute abnormalities, lipase is normal, CBC with mild leukocytosis white blood cell count 11.1 otherwise normal Radiology: ordered.    Details: X-ray shows no pulmonary edema or infiltrate, no pneumothorax  Risk OTC drugs. Prescription drug management.        Final diagnoses:  Nonspecific chest pain  Gastroesophageal reflux disease, unspecified whether esophagitis present  Belching    ED Discharge Orders          Ordered    pantoprazole  (PROTONIX ) 40 MG tablet  Daily         10/07/23 1952               Suellen Sherran DELENA DEVONNA 10/07/23 ARTEMUS Anna Pac, MD 10/09/23 1349

## 2023-10-07 NOTE — ED Notes (Signed)
 ..  The patient is A&OX4, ambulatory at d/c with independent steady gait, NAD. Pt verbalized understanding of d/c instructions, prescription and follow up care.

## 2023-10-08 ENCOUNTER — Emergency Department (HOSPITAL_COMMUNITY)
Admission: EM | Admit: 2023-10-08 | Discharge: 2023-10-08 | Disposition: A | Discharge status: Home / Self Care | Attending: Emergency Medicine | Admitting: Emergency Medicine

## 2023-10-08 ENCOUNTER — Other Ambulatory Visit: Payer: Self-pay

## 2023-10-08 ENCOUNTER — Encounter (HOSPITAL_COMMUNITY): Payer: Self-pay | Admitting: Emergency Medicine

## 2023-10-08 DIAGNOSIS — H938X2 Other specified disorders of left ear: Secondary | ICD-10-CM | POA: Diagnosis not present

## 2023-10-08 DIAGNOSIS — H9202 Otalgia, left ear: Secondary | ICD-10-CM | POA: Diagnosis not present

## 2023-10-08 MED ORDER — FLUTICASONE PROPIONATE 50 MCG/ACT NA SUSP: 2.0000 | Freq: Every day | NASAL | 0 refills | Status: DC

## 2023-10-08 NOTE — Discharge Instructions (Signed)
 We are prescribing you a nasal spray to see if this helps with your congestion and ear symptoms.  Follow-up with your primary care provider.  If you develop fever, ear pain, or any other new/concerning symptoms then return to the ER.

## 2023-10-08 NOTE — ED Provider Notes (Signed)
 [place] EMERGENCY DEPARTMENT AT [facility] Provider Note   CSN: [id number] Arrival date & time: 10/08/23  9051     Patient presents with: Ear Fullness   Anna Andrews is a 28 y.o. female.   HPI 28 year old female presents with ear pain/fullness for the last 3 weeks or so.  Sometimes she gets pain in her left neck/trapezius but she thinks that might have been from when she was throwing her children in the swimming pool.  No fevers.  When she puts peroxide in her ear it feels better.  She was here yesterday for some acid reflux and when she was checking her labs noticed her WBC to be 11 and so she called her doctor this morning out of concern and they told her to come to the ER for reevaluation.  She does have some drainage in her throat along the same time course.  Prior to Admission medications   Medication Sig Start Date End Date Taking? Authorizing Provider  fluticasone  (FLONASE ) 50 MCG/ACT nasal spray Place 2 sprays into both nostrils daily. 10/08/23  Yes Freddi Hamilton, MD  acetaminophen  (TYLENOL ) 500 MG tablet Take 500 mg by mouth every 6 (six) hours as needed. Patient not taking: Reported on 09/25/2023    [provider]  acetic acid -hydrocortisone  (VOSOL -HC) OTIC solution Place 3 drops into the left ear 3 (three) times daily. Apply to affected ear. For ear canal irritation, itching. 07/14/23   Lucius Krabbe, NP  albuterol  (VENTOLIN  HFA) 108 (90 Base) MCG/ACT inhaler Inhale 2 puffs into the lungs every 6 (six) hours as needed for wheezing or shortness of breath. 12/25/22   Lucius Krabbe, NP  albuterol  (VENTOLIN  HFA) 108 (90 Base) MCG/ACT inhaler Inhale 2 puffs into the lungs every 4 (four) hours as needed. 08/07/23   Stuart Vernell Norris, PA-C  azelastine  (ASTELIN ) 0.1 % nasal spray Place 1 spray into both nostrils 2 (two) times daily. Use in each nostril as directed 08/07/23   Stuart Vernell Norris, PA-C  Cholecalciferol (VITAMIN D3) 1.25 MG (50000 UT)  CAPS Take 1 capsule (1.25 mg total) by mouth once a week. AFTER the last weekly pill, start a daily OTC Vitamin D3 up to 5,000units. 12/27/22   Lucius Krabbe, NP  EPINEPHrine  0.3 mg/0.3 mL IJ SOAJ injection Inject 0.3 mg into the muscle as needed for anaphylaxis. 03/11/21   Mesner, Selinda, MD  lidocaine  (LIDODERM ) 5 % Place 1 patch onto the skin daily. Remove & Discard patch within 12 hours or as directed by MD 06/24/23   Suellen Cantor A, PA-C  pantoprazole  (PROTONIX ) 40 MG tablet Take 1 tablet (40 mg total) by mouth daily. 10/07/23   Suellen Cantor A, PA-C  sertraline  (ZOLOFT ) 25 MG tablet Take 1 tablet (25 mg total) by mouth daily. 03/20/23   Lucius Krabbe, NP  enalapril  (VASOTEC ) 5 MG tablet Take 1 tablet (5 mg total) by mouth daily. Patient not taking: Reported on 06/02/2019 05/05/19 11/02/19  Newton Mering, CNM    Allergies: Pineapple    Review of Systems  Constitutional:  Negative for fever.  HENT:  Positive for ear pain. Negative for dental problem.     Updated Vital Signs BP 122/83   Pulse 84   Temp 98 F (36.7 C) (Oral)   Ht 5' 8 (1.727 m)   Wt 99 kg   LMP 10/07/2023 (Exact Date)   SpO2 98%   BMI 33.19 kg/m   Physical Exam Vitals and nursing note reviewed.  Constitutional:  Appearance: She is well-developed.  HENT:     Head: Normocephalic and atraumatic.     Right Ear: Tympanic membrane normal.     Left Ear: Tympanic membrane normal.     Ears:     Comments: No obvious otitis externa or otitis media on exam.  No mastoid tenderness on the left Neck:     Comments: No neck swelling appreciated.  Perhaps some mild upper back/trapezius tenderness Cardiovascular:     Rate and Rhythm: Normal rate.  Pulmonary:     Effort: Pulmonary effort is normal.  Abdominal:     General: There is no distension.  Skin:    General: Skin is warm and dry.  Neurological:     Mental Status: She is alert.     (all labs ordered are listed, but only abnormal results  are displayed) Labs Reviewed - No data to display  EKG: None  Radiology: DG Chest 2 View Result Date: 10/07/2023 CLINICAL DATA:  Chest pain. Sternal chest pain beginning last night and radiating to the back. EXAM: CHEST - 2 VIEW COMPARISON:  06/24/2023 FINDINGS: The heart size and mediastinal contours are within normal limits. Both lungs are clear. The visualized skeletal structures are unremarkable. IMPRESSION: No active cardiopulmonary disease. Electronically Signed   By: Elsie Gravely M.D.   On: 10/07/2023 17:16     Procedures   Medications Ordered in the ED - No data to display                                  Medical Decision Making  Patient's vital signs are normal.  I doubt that her WBC is related to her ear pain which has been ongoing for weeks.  She does have a history of allergies and is prescribed Nasacort  but states she does not take it.  I will prescribe her some Flonase  to see if this helps but otherwise I do not see any acute infection on my exam.  Very low suspicion for mastoiditis or other significant illness.  Will discharge to follow-up with PCP.  Given return precautions.     Final diagnoses:  Fullness in left ear    ED Discharge Orders          Ordered    fluticasone  (FLONASE ) 50 MCG/ACT nasal spray  Daily        10/08/23 1036               Freddi Hamilton, MD 10/08/23 1044

## 2023-10-08 NOTE — ED Triage Notes (Signed)
 Pt states she has a high wbc and left ear pain/ fullness that runs down her face and into her neck. Her pcp told her to come to the ED because she is out of town.

## 2023-10-17 ENCOUNTER — Other Ambulatory Visit: Payer: Self-pay | Admitting: Family

## 2023-10-17 DIAGNOSIS — F419 Anxiety disorder, unspecified: Secondary | ICD-10-CM

## 2024-03-16 ENCOUNTER — Ambulatory Visit
Admission: RE | Admit: 2024-03-16 | Discharge: 2024-03-16 | Disposition: A | Discharge status: Home / Self Care | Payer: Self-pay | Source: Ambulatory Visit | Attending: Nurse Practitioner

## 2024-03-16 VITALS — BP 118/85 | HR 93 | Temp 98.4°F | Resp 18

## 2024-03-16 DIAGNOSIS — H9202 Otalgia, left ear: Secondary | ICD-10-CM

## 2024-03-16 DIAGNOSIS — J0110 Acute frontal sinusitis, unspecified: Secondary | ICD-10-CM

## 2024-03-16 MED ORDER — FLUTICASONE PROPIONATE 50 MCG/ACT NA SUSP: 2.0000 | Freq: Every day | NASAL | 0 refills | Status: AC

## 2024-03-16 MED ORDER — AMOXICILLIN-POT CLAVULANATE 875-125 MG PO TABS: 1.0000 | ORAL_TABLET | Freq: Two times a day (BID) | ORAL | 0 refills | Status: AC

## 2024-03-16 MED ORDER — PROMETHAZINE-DM 6.25-15 MG/5ML PO SYRP: 5.0000 mL | ORAL_SOLUTION | Freq: Four times a day (QID) | ORAL | 0 refills | Status: AC | PRN

## 2024-03-16 NOTE — ED Triage Notes (Signed)
 Pt reports left ear pain and fullness x 2 weeks has tried sweet oil, antibiotic drops, peroxide and alcohol has found no relief.

## 2024-03-16 NOTE — Discharge Instructions (Addendum)
 Take medication as directed. Continue Sudafed as needed while symptoms persist. Increase fluids and get plenty of rest. May take over-the-counter Ibuprofen  or Tylenol  as needed for pain, fever, or general discomfort. Recommend normal saline nasal spray to help with nasal congestion throughout the day. For your cough, it may be helpful to use a humidifier at bedtime during sleep. You may apply warm compresses to the affected ear to help with pain or discomfort. Do not stick or insert anything inside of the ear while symptoms persist.  Also recommend avoiding entrance of water inside of the ear. If symptoms fail to improve with this treatment, you may follow-up in this clinic or with your primary care physician for further evaluation. Follow-up as needed.

## 2024-03-16 NOTE — ED Provider Notes (Signed)
 " [facility]    CSN: [id number] Arrival date & time: 03/16/24  0904      History   Chief Complaint Chief Complaint  Patient presents with   Ear Fullness    2 weeks with ear pain and putting peroxide in my ear for the fullness and not letting up aching worse use eardrop antibiotics didnt do anything but make it worse and full. I think its infected. - Entered by patient    HPI Anna Andrews is a 29 y.o. female.   The history is provided by the patient.   Patient presents with a 2-week history of headache, left ear pain, left ear pressure, facial pain, nasal congestion, and cough.  Patient states that she has decreased hearing from the left ear.  States when she inserted her finger inside the ear, she had some clear drainage on the tip of her finger.  She denies fever, chills, wheezing, difficulty breathing, abdominal pain, nausea, vomiting, diarrhea, or rash.  Patient states that she has tried several remedies such as alcohol, peroxide, states that she used her child's prescription eardrops, with no relief.  States that she did begin using Sudafed which caused her to have some relief of her symptoms.  Past Medical History:  Diagnosis Date   Acute non-recurrent frontal sinusitis 07/22/2021   Acute right-sided low back pain 05/14/2021   Anxiety    Eczema    Encounter for menstrual regulation 05/29/2021   Gestational hypertension 04/27/2019   Influenza 02/14/2021   Late period 05/14/2021   Pelvic pain 05/14/2021   Right calf pain 05/14/2021   Strep sore throat 04/10/2021   Supervision of normal first pregnancy 10/19/2018     FAMILY TREE  LAB RESULTS  Language English Pap 08/20/16 neg  Initiated care at [facility]/CT Initial: neg/neg        36wks: neg/neg  Dating by LMP c/w 9wk U/S    Support person [employer] NT/IT/AFP/MaterniT21:declined    Ferguson/HgbE neg  Flu vaccine 12/22/18  CF declined  TDaP vaccine 02/16/19  SMA declined  Rhogam N/A Fragile X declined       Anatomy US   Normal female Blood Type A/Positive/-- (08/11    Symptoms of urinary tract infection 05/14/2021    Patient Active Problem List   Diagnosis Date Noted   Gastroesophageal reflux disease with esophagitis without hemorrhage 05/30/2021   Menorrhagia with regular cycle 05/29/2021   Dysmenorrhea 05/14/2021   Dyspareunia in female 05/14/2021   Annual physical exam 01/25/2021   Pain and swelling of right lower leg 01/25/2021   Obesity (BMI 30-39.9) 01/25/2021   Anxiety 10/19/2018    Past Surgical History:  Procedure Laterality Date   NO PAST SURGERIES      OB History     Gravida  1   Para  1   Term  1   Preterm  0   AB  0   Living  1      SAB  0   IAB  0   Ectopic  0   Multiple  0   Live Births  1            Home Medications    Prior to Admission medications  Medication Sig Start Date End Date Taking? Authorizing Provider  amoxicillin -clavulanate (AUGMENTIN ) 875-125 MG tablet Take 1 tablet by mouth every 12 (twelve) hours. 03/16/24  Yes Leath-Warren, Etta PARAS, NP  fluticasone  (FLONASE ) 50 MCG/ACT nasal spray Place 2 sprays into both nostrils daily. 03/16/24  Yes Leath-Warren, Etta PARAS, NP  promethazine -dextromethorphan (PROMETHAZINE -DM) 6.25-15 MG/5ML syrup Take 5 mLs by mouth 4 (four) times daily as needed. 03/16/24  Yes Leath-Warren, Etta PARAS, NP  acetaminophen  (TYLENOL ) 500 MG tablet Take 500 mg by mouth every 6 (six) hours as needed. Patient not taking: Reported on 09/25/2023    [provider]  acetic acid -hydrocortisone  (VOSOL -HC) OTIC solution Place 3 drops into the left ear 3 (three) times daily. Apply to affected ear. For ear canal irritation, itching. 07/14/23   Lucius Krabbe, NP  albuterol  (VENTOLIN  HFA) 108 (90 Base) MCG/ACT inhaler Inhale 2 puffs into the lungs every 6 (six) hours as needed for wheezing or shortness of breath. 12/25/22   Lucius Krabbe, NP  albuterol  (VENTOLIN  HFA) 108 (90 Base) MCG/ACT inhaler Inhale 2 puffs into the  lungs every 4 (four) hours as needed. 08/07/23   Stuart Vernell Norris, PA-C  azelastine  (ASTELIN ) 0.1 % nasal spray Place 1 spray into both nostrils 2 (two) times daily. Use in each nostril as directed 08/07/23   Stuart Vernell Norris, PA-C  Cholecalciferol (VITAMIN D3) 1.25 MG (50000 UT) CAPS Take 1 capsule (1.25 mg total) by mouth once a week. AFTER the last weekly pill, start a daily OTC Vitamin D3 up to 5,000units. 12/27/22   Lucius Krabbe, NP  EPINEPHrine  0.3 mg/0.3 mL IJ SOAJ injection Inject 0.3 mg into the muscle as needed for anaphylaxis. 03/11/21   Mesner, Selinda, MD  lidocaine  (LIDODERM ) 5 % Place 1 patch onto the skin daily. Remove & Discard patch within 12 hours or as directed by MD 06/24/23   Suellen Cantor A, PA-C  pantoprazole  (PROTONIX ) 40 MG tablet Take 1 tablet (40 mg total) by mouth daily. 10/07/23   Beatty, Celeste A, PA-C  sertraline  (ZOLOFT ) 25 MG tablet TAKE ONE TABLET (25MG  TOTAL) BY MOUTH DAILY 10/19/23   Lucius Krabbe, NP  enalapril  (VASOTEC ) 5 MG tablet Take 1 tablet (5 mg total) by mouth daily. Patient not taking: Reported on 06/02/2019 05/05/19 11/02/19  Newton Mering, CNM    Family History Family History  Problem Relation Age of Onset   Hypertension Paternal Grandfather    CAD Paternal Grandmother    Hypertension Mother     Social History Social History[1]   Allergies   Pineapple   Review of Systems Review of Systems Per HPI  Physical Exam Triage Vital Signs ED Triage Vitals  Encounter Vitals Group     BP 03/16/24 0919 118/85     Girls Systolic BP Percentile --      Girls Diastolic BP Percentile --      Boys Systolic BP Percentile --      Boys Diastolic BP Percentile --      Pulse Rate 03/16/24 0919 93     Resp 03/16/24 0919 18     Temp 03/16/24 0919 98.4 F (36.9 C)     Temp Source 03/16/24 0919 Oral     SpO2 03/16/24 0919 97 %     Weight --      Height --      Head Circumference --      Peak Flow --      Pain Score  03/16/24 0922 5     Pain Loc --      Pain Education --      Exclude from Growth Chart --    No data found.  Updated Vital Signs BP 118/85 (BP Location: Right Arm)   Pulse 93   Temp 98.4 F (36.9 C) (Oral)  Resp 18   LMP 03/11/2024 (Exact Date)   SpO2 97%   Visual Acuity Right Eye Distance:   Left Eye Distance:   Bilateral Distance:    Right Eye Near:   Left Eye Near:    Bilateral Near:     Physical Exam Vitals and nursing note reviewed.  Constitutional:      General: She is not in acute distress.    Appearance: Normal appearance.  HENT:     Head: Normocephalic.     Right Ear: Tympanic membrane, ear canal and external ear normal.     Left Ear: Tympanic membrane, ear canal and external ear normal.     Nose: Congestion present.     Right Turbinates: Enlarged and swollen.     Left Turbinates: Enlarged and swollen.     Right Sinus: Frontal sinus tenderness present. No maxillary sinus tenderness.     Left Sinus: Frontal sinus tenderness present. No maxillary sinus tenderness.     Mouth/Throat:     Lips: Pink.     Mouth: Mucous membranes are moist.     Pharynx: Uvula midline. Postnasal drip present. No pharyngeal swelling, oropharyngeal exudate, posterior oropharyngeal erythema or uvula swelling.     Comments: Cobblestoning present to posterior oropharynx  Eyes:     Extraocular Movements: Extraocular movements intact.     Conjunctiva/sclera: Conjunctivae normal.     Pupils: Pupils are equal, round, and reactive to light.  Cardiovascular:     Rate and Rhythm: Normal rate and regular rhythm.     Pulses: Normal pulses.     Heart sounds: Normal heart sounds.  Pulmonary:     Effort: Pulmonary effort is normal. No respiratory distress.     Breath sounds: Normal breath sounds. No stridor. No wheezing, rhonchi or rales.  Abdominal:     General: Bowel sounds are normal.     Palpations: Abdomen is soft.  Musculoskeletal:     Cervical back: Normal range of motion. No  rigidity.  Skin:    General: Skin is warm and dry.  Neurological:     General: No focal deficit present.     Mental Status: She is alert and oriented to person, place, and time.  Psychiatric:        Mood and Affect: Mood normal.        Behavior: Behavior normal.      UC Treatments / Results  Labs (all labs ordered are listed, but only abnormal results are displayed) Labs Reviewed - No data to display  EKG   Radiology No results found.  Procedures Procedures (including critical care time)  Medications Ordered in UC Medications - No data to display  Initial Impression / Assessment and Plan / UC Course  I have reviewed the triage vital signs and the nursing notes.  Pertinent labs & imaging results that were available during my care of the patient were reviewed by me and considered in my medical decision making (see chart for details).  On exam, the patient's lung sounds are clear throughout, room air sats are at 97%.  She does exhibit frontal sinus tenderness on exam, she also has a left middle ear effusion.  Symptoms are consistent with acute frontal sinusitis.  Will treat for bacterial etiology given the duration of her symptoms for the past 2 weeks.  Treat with Augmentin  875/125 mg.  For the cough, treat with Promethazine  DM, and fluticasone  50 mcg nasal spray for the left middle ear effusion and nasal congestion.  Supportive care recommendations  were provided and discussed with the patient to include fluids, rest, over-the-counter analgesics, use of normal saline nasal spray, and use of humidifier during sleep.  Discussed indications with the patient regarding follow-up.  Patient was in agreement with this plan of care and verbalizes understanding.  All questions were answered.  Patient stable for discharge.   Final Clinical Impressions(s) / UC Diagnoses   Final diagnoses:  Acute frontal sinusitis, recurrence not specified  Acute otalgia, left     Discharge Instructions       Take medication as directed. Continue Sudafed as needed while symptoms persist. Increase fluids and get plenty of rest. May take over-the-counter Ibuprofen  or Tylenol  as needed for pain, fever, or general discomfort. Recommend normal saline nasal spray to help with nasal congestion throughout the day. For your cough, it may be helpful to use a humidifier at bedtime during sleep. You may apply warm compresses to the affected ear to help with pain or discomfort. Do not stick or insert anything inside of the ear while symptoms persist.  Also recommend avoiding entrance of water inside of the ear. If symptoms fail to improve with this treatment, you may follow-up in this clinic or with your primary care physician for further evaluation. Follow-up as needed.     ED Prescriptions     Medication Sig Dispense Auth. Provider   amoxicillin -clavulanate (AUGMENTIN ) 875-125 MG tablet Take 1 tablet by mouth every 12 (twelve) hours. 14 tablet Leath-Warren, Etta PARAS, NP   promethazine -dextromethorphan (PROMETHAZINE -DM) 6.25-15 MG/5ML syrup Take 5 mLs by mouth 4 (four) times daily as needed. 118 mL Leath-Warren, Etta PARAS, NP   fluticasone  (FLONASE ) 50 MCG/ACT nasal spray Place 2 sprays into both nostrils daily. 16 g Leath-Warren, Etta PARAS, NP      PDMP not reviewed this encounter.     [1]  Social History Tobacco Use   Smoking status: Former    Current packs/day: 0.50    Types: Cigarettes   Smokeless tobacco: Never   Tobacco comments:    occ  Vaping Use   Vaping status: Never Used  Substance Use Topics   Alcohol use: No   Drug use: No     Gilmer Etta PARAS, NP 03/16/24 [phone]  "
